# Patient Record
Sex: Female | Born: 1955
Health system: Southern US, Community
[De-identification: ages and names within clinical notes are randomized; demographics above are authoritative.]

## PROBLEM LIST (undated history)

## (undated) DIAGNOSIS — F329 Major depressive disorder, single episode, unspecified: Secondary | ICD-10-CM

## (undated) DIAGNOSIS — E669 Obesity, unspecified: Secondary | ICD-10-CM

## (undated) DIAGNOSIS — E739 Lactose intolerance, unspecified: Secondary | ICD-10-CM

## (undated) DIAGNOSIS — I10 Essential (primary) hypertension: Secondary | ICD-10-CM

## (undated) DIAGNOSIS — R931 Abnormal findings on diagnostic imaging of heart and coronary circulation: Secondary | ICD-10-CM

## (undated) DIAGNOSIS — C50919 Malignant neoplasm of unspecified site of unspecified female breast: Secondary | ICD-10-CM

## (undated) DIAGNOSIS — Z808 Family history of malignant neoplasm of other organs or systems: Secondary | ICD-10-CM

## (undated) DIAGNOSIS — Z8041 Family history of malignant neoplasm of ovary: Secondary | ICD-10-CM

## (undated) DIAGNOSIS — K760 Fatty (change of) liver, not elsewhere classified: Secondary | ICD-10-CM

## (undated) DIAGNOSIS — N979 Female infertility, unspecified: Secondary | ICD-10-CM

## (undated) DIAGNOSIS — F32A Depression, unspecified: Secondary | ICD-10-CM

## (undated) DIAGNOSIS — G4730 Sleep apnea, unspecified: Secondary | ICD-10-CM

## (undated) DIAGNOSIS — R0602 Shortness of breath: Secondary | ICD-10-CM

## (undated) DIAGNOSIS — Z8049 Family history of malignant neoplasm of other genital organs: Secondary | ICD-10-CM

## (undated) DIAGNOSIS — E785 Hyperlipidemia, unspecified: Secondary | ICD-10-CM

## (undated) DIAGNOSIS — R7303 Prediabetes: Secondary | ICD-10-CM

## (undated) DIAGNOSIS — Z853 Personal history of malignant neoplasm of breast: Secondary | ICD-10-CM

## (undated) DIAGNOSIS — E079 Disorder of thyroid, unspecified: Secondary | ICD-10-CM

## (undated) DIAGNOSIS — Z800 Family history of malignant neoplasm of digestive organs: Secondary | ICD-10-CM

## (undated) HISTORY — DX: Abnormal findings on diagnostic imaging of heart and coronary circulation: R93.1

## (undated) HISTORY — PX: BREAST SURGERY: SHX581

## (undated) HISTORY — DX: Disorder of thyroid, unspecified: E07.9

## (undated) HISTORY — DX: Fatty (change of) liver, not elsewhere classified: K76.0

## (undated) HISTORY — DX: Shortness of breath: R06.02

## (undated) HISTORY — DX: Essential (primary) hypertension: I10

## (undated) HISTORY — DX: Family history of malignant neoplasm of ovary: Z80.41

## (undated) HISTORY — DX: Malignant neoplasm of unspecified site of unspecified female breast: C50.919

## (undated) HISTORY — DX: Family history of malignant neoplasm of other genital organs: Z80.49

## (undated) HISTORY — DX: Depression, unspecified: F32.A

## (undated) HISTORY — DX: Family history of malignant neoplasm of digestive organs: Z80.0

## (undated) HISTORY — DX: Sleep apnea, unspecified: G47.30

## (undated) HISTORY — PX: HERNIA REPAIR: SHX51

## (undated) HISTORY — PX: BUNIONECTOMY: SHX129

## (undated) HISTORY — DX: Obesity, unspecified: E66.9

## (undated) HISTORY — DX: Hyperlipidemia, unspecified: E78.5

## (undated) HISTORY — PX: REFRACTIVE SURGERY: SHX103

## (undated) HISTORY — DX: Prediabetes: R73.03

## (undated) HISTORY — DX: Female infertility, unspecified: N97.9

## (undated) HISTORY — DX: Major depressive disorder, single episode, unspecified: F32.9

## (undated) HISTORY — DX: Lactose intolerance, unspecified: E73.9

## (undated) HISTORY — DX: Family history of malignant neoplasm of other organs or systems: Z80.8

## (undated) HISTORY — DX: Personal history of malignant neoplasm of breast: Z85.3

---

## 2011-06-25 DIAGNOSIS — C50919 Malignant neoplasm of unspecified site of unspecified female breast: Secondary | ICD-10-CM

## 2011-06-25 DIAGNOSIS — Z170 Estrogen receptor positive status [ER+]: Secondary | ICD-10-CM | POA: Insufficient documentation

## 2011-06-25 HISTORY — DX: Malignant neoplasm of unspecified site of unspecified female breast: C50.919

## 2015-11-24 DIAGNOSIS — C50911 Malignant neoplasm of unspecified site of right female breast: Secondary | ICD-10-CM | POA: Diagnosis not present

## 2015-11-24 DIAGNOSIS — Z7689 Persons encountering health services in other specified circumstances: Secondary | ICD-10-CM | POA: Diagnosis not present

## 2015-12-13 MED FILL — ROSUVASTATIN CALCIUM 20 MG: 20 | 30 days supply | Qty: 30 | Fill #0

## 2015-12-13 MED FILL — ESCITALOPRAM 20 MG TABLET: 20 | 30 days supply | Qty: 30 | Fill #0

## 2015-12-13 MED FILL — LETROZOLE 2.5 MG TABLET: 2.5 | 90 days supply | Qty: 90 | Fill #0

## 2015-12-13 MED FILL — LEVOTHYROXINE 125 MCG TAB: 125 | 30 days supply | Qty: 30 | Fill #0

## 2016-02-14 MED FILL — ESCITALOPRAM 20 MG TABLET: 20 | 30 days supply | Qty: 30 | Fill #1

## 2016-02-14 MED FILL — LEVOTHYROXINE 125 MCG TAB: 125 | 30 days supply | Qty: 30 | Fill #1

## 2016-02-27 MED FILL — ROSUVASTATIN CALCIUM 20 MG: 20 | 30 days supply | Qty: 30 | Fill #1

## 2016-03-11 MED FILL — LETROZOLE 2.5 MG TABLET: 2.5 | 90 days supply | Qty: 90 | Fill #1

## 2016-03-11 MED FILL — ESCITALOPRAM 20 MG TABLET: 20 | 30 days supply | Qty: 30 | Fill #2

## 2016-03-11 MED FILL — LEVOTHYROXINE 125 MCG TAB: 125 | 90 days supply | Qty: 90 | Fill #2

## 2016-03-28 ENCOUNTER — Ambulatory Visit: Payer: Self-pay

## 2016-04-01 MED FILL — ROSUVASTATIN CALCIUM 20 MG: 20 | 30 days supply | Qty: 30 | Fill #2

## 2016-04-19 DIAGNOSIS — E785 Hyperlipidemia, unspecified: Secondary | ICD-10-CM | POA: Diagnosis not present

## 2016-04-19 DIAGNOSIS — E039 Hypothyroidism, unspecified: Secondary | ICD-10-CM | POA: Diagnosis not present

## 2016-04-19 DIAGNOSIS — D7589 Other specified diseases of blood and blood-forming organs: Secondary | ICD-10-CM | POA: Diagnosis not present

## 2016-04-19 DIAGNOSIS — Z8659 Personal history of other mental and behavioral disorders: Secondary | ICD-10-CM | POA: Diagnosis not present

## 2016-04-19 DIAGNOSIS — E559 Vitamin D deficiency, unspecified: Secondary | ICD-10-CM | POA: Diagnosis not present

## 2016-04-19 DIAGNOSIS — R7301 Impaired fasting glucose: Secondary | ICD-10-CM | POA: Diagnosis not present

## 2016-04-19 DIAGNOSIS — Z1211 Encounter for screening for malignant neoplasm of colon: Secondary | ICD-10-CM | POA: Diagnosis not present

## 2016-04-19 MED FILL — ESCITALOPRAM 20 MG TABLET: 20 | 90 days supply | Qty: 90 | Fill #0

## 2016-04-29 ENCOUNTER — Ambulatory Visit (INDEPENDENT_AMBULATORY_CARE_PROVIDER_SITE_OTHER): Payer: 59 | Admitting: Physician Assistant

## 2016-04-29 DIAGNOSIS — E039 Hypothyroidism, unspecified: Secondary | ICD-10-CM

## 2016-04-29 DIAGNOSIS — Q845 Enlarged and hypertrophic nails: Secondary | ICD-10-CM

## 2016-04-29 DIAGNOSIS — E785 Hyperlipidemia, unspecified: Secondary | ICD-10-CM

## 2016-04-29 DIAGNOSIS — Z853 Personal history of malignant neoplasm of breast: Secondary | ICD-10-CM | POA: Diagnosis not present

## 2016-04-29 NOTE — Progress Notes (Signed)
Patient ID: Brittany Vincent, female     DOB: Jul 23, 1955, 60 y.o.    MRN: [id number]  PCP: No primary care provider on file.  Chief Complaint  Patient presents with  . Ingrown Toenail    rt big toe    Subjective:    HPI  Presents for evaluation of thickened toenails.  The toenails on both great toes are thickened and discolored. SOmetimes she has pain along the distal nail fold, and she's concerned that they may be ingrowing. The nails have been thickened since she underwent chemotherapy for breast cancer, diagnosed in the fall of 2013. She saw a podiatrist in [place], [place] (where she lived prior to moving to [place] to be near her son, daughter-in-law and granddaughter), who offered removal. At the time, there was no pain, and she elected against the procedure. Now that she is having pain, she's ready.  Prior to Admission medications   Medication Sig Start Date End Date Taking? Authorizing Provider  escitalopram (LEXAPRO) 20 MG tablet Take 20 mg by mouth daily.   Yes Historical Provider, MD  estrogens conjugated, synthetic A, (CENESTIN) 1.25 MG tablet Take 1.25 mg by mouth daily.   Yes Historical Provider, MD  levothyroxine (SYNTHROID, LEVOTHROID) 125 MCG tablet Take 125 mcg by mouth daily before breakfast.   Yes Historical Provider, MD  rosuvastatin (CRESTOR) 20 MG tablet Take 20 mg by mouth daily.   Yes Historical Provider, MD      No Known Allergies   Patient Active Problem List   Diagnosis Date Noted  . History of breast cancer 05/01/2016  . Hypothyroidism 05/01/2016  . Hyperlipidemia 05/01/2016     Family History  Problem Relation Age of Onset  . Heart disease Mother   . Cancer Father   . Stroke Maternal Grandmother   . Cancer Maternal Grandfather   . Heart disease Paternal Grandmother   . Stroke Paternal Grandfather      Social History   Social History  . Marital status: Married    Spouse name: N/A  . Number of children: 3  . Years of education: N/A     Occupational History  . RN    Social History Main Topics  . Smoking status: Never Smoker  . Smokeless tobacco: Never Used  . Alcohol use Not on file  . Drug use: Unknown  . Sexual activity: Not on file   Other Topics Concern  . Not on file   Social History Narrative   Lives with her husband.   Older son lives in [place], [place] with his wife and their 2 children.   Younger Nature conservation officer) son lives in [place], with his wife Lisbeth Renshaw) and their daughter, Ava.   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   Daughter recently moved to [place].   Moved to [place] from [place], [place] to be nearer to [place], [place] and Ava.        Review of Systems As above.      Objective:  Physical Exam  Constitutional: She is oriented to person, place, and time. She appears well-developed and well-nourished. She is active and cooperative. No distress.  There were no vitals taken for this visit.   Eyes: Conjunctivae are normal.  Pulmonary/Chest: Effort normal.  Neurological: She is alert and oriented to person, place, and time.  Skin: Skin is warm, dry and intact.  Nails on both great toes are thickened and discolored. Mild tenderness along the lateral nail folds bilaterally. No erythema, edema. No  drainage.  Psychiatric: She has a normal mood and affect. Her speech is normal and behavior is normal.     PROCEDURE: Verbal consent obtained. Performed nail removal on the RIGHT, then assisted Golda Acre, PA-S perform the same on the LEFT. Digital block with 4 cc 2% lidocaine plain.  SP&D.  Entire nail lifted and removed.  Xeroform placed.  Cleansed and dressed.         Assessment & Plan:  1. Enlarged and hypertrophic nails Nails on both great toes removed en toto. Local wound care. Anticipatory guidance provided.    Fara Chute, PA-C Physician Assistant-Certified [facility]

## 2016-04-29 NOTE — Progress Notes (Signed)
Subjective:    Patient ID: Brittany Vincent, female    DOB: [birth year], 60 y.o.   MRN: [id number]  HPI: Presents for problem with both great toe nails. Patient states she first noticed a problem in her right great toe nail in February 2014 after she had been receiving chemotherapy. Notes she had also been getting regular pedicures at this time and unsure if it could be related to that. States she had redness, swelling, and brown drainage from the toe at that time and her oncologist gave her an antibiotic which provided some relief and made the discharge subside. Since then, she has noticed continued thickening of both of her great toe nails with occasional associated pain and redness in her toes. States she saw a podiatrist 1 and 2 years ago in [place], [place] (previously lived there), however, it was not painful at that time therefore they decided to leave it alone. Patient states she has tried to use OTC topical medications for her nails which have not been very helpful at all. Denies trying Lamisil or systemic therapy for the toenail fungus. States she had foot surgery on her right foot in 2006 for a Morton's neuroma and bunion during which her right great toe was broken in 2 places and a screw was placed. States the right great toe has been becoming more and more painful over the past 6 months, noting some increased pain at night when the blankets hit her toe, but denies swelling, erythema, or discharge.  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 States she is still followed by her oncologist in [place]. Also states she recently saw her PCP and denies any other current problems.  Review of Systems Pertinent ROS mentioned above in HPI  No Known Allergies  Prior to Admission medications   Medication Sig Start Date End Date Taking? Authorizing Provider  escitalopram (LEXAPRO) 20 MG tablet Take 20 mg by mouth daily.   Yes Historical Provider,  MD  estrogens conjugated, synthetic A, (CENESTIN) 1.25 MG tablet Take 1.25 mg by mouth daily.   Yes Historical Provider, MD  rosuvastatin (CRESTOR) 20 MG tablet Take 20 mg by mouth daily.   Yes Historical Provider, MD   There are no active problems to display for this patient.      Objective:   Physical Exam  Musculoskeletal:       Right foot: There is normal range of motion, no tenderness, no bony tenderness, no swelling, normal capillary refill, no crepitus, no deformity and no laceration.       Left foot: There is normal range of motion, no tenderness, no bony tenderness, no swelling, normal capillary refill, no crepitus, no deformity and no laceration.       Feet:      Bilateral great toenail removal Patient consent obtained. Digital block performed in bilateral great toes with 5 cc in each toe of 2% plain Lidocaine. Betadine prep used to clean both great toes. Nail removal using probe and curved hemostats. Surrounding nail tissue removed using iris scissors. Nail growth gauze place on nail bed. Both great toes covered with non-adherent Tefla, gauze, and Coban.     Assessment & Plan:  1. Enlarged and hypertrophic nails Bilateral great toenail removal. Advised patient to keep nails clean and bandaged for 24 hours and then can change the outer bandage. Soak feet in warm and soapy water each day for 5-10 minutes for the next 5  days and re-dress the toe afterwards, making sure to dry completely. Continue to soak until yellow gauze comes off. Advised patient to RTC if symptoms worsening, increased swelling, redness, pus, drainage,streaking, or fever. Consider Lamisil in future if nails grow back with fungus.

## 2016-04-29 NOTE — Patient Instructions (Addendum)
     IF you received an x-ray today, you will receive an invoice from [facility]. Please contact [facility] at [phone] with questions or concerns regarding your invoice.   IF you received labwork today, you will receive an invoice from [facility] [employer]. Please contact [facility] at [phone] with questions or concerns regarding your invoice.   Our billing staff will not be able to assist you with questions regarding bills from these companies.  You will be contacted with the lab results as soon as they are available. The fastest way to get your results is to activate your My Chart account. Instructions are located on the last page of this paperwork. If you have not heard from [place] regarding the results in 2 weeks, please contact this office.    INGROWN TOENAIL . Keep area clean, dry and bandaged for 24 hours. . After 24 hours, remove outer bandage and leave yellow gauze in place. Domingo Madeira toe/foot in warm soapy water for 5-10 minutes, once daily for 5 days. Rebandage toe after each cleaning. . Continue soaks until yellow gauze falls off. . Notify the office if you experience any of the following signs of infection: Swelling, redness, pus drainage, streaking, fever > 101.0 F

## 2016-05-01 ENCOUNTER — Encounter: Payer: Self-pay | Admitting: Physician Assistant

## 2016-05-01 DIAGNOSIS — E785 Hyperlipidemia, unspecified: Secondary | ICD-10-CM | POA: Insufficient documentation

## 2016-05-01 DIAGNOSIS — E039 Hypothyroidism, unspecified: Secondary | ICD-10-CM

## 2016-05-01 DIAGNOSIS — Z853 Personal history of malignant neoplasm of breast: Secondary | ICD-10-CM | POA: Insufficient documentation

## 2016-05-01 HISTORY — DX: Hypothyroidism, unspecified: E03.9

## 2016-05-14 ENCOUNTER — Telehealth: Payer: Self-pay | Admitting: Nurse Practitioner

## 2016-05-14 ENCOUNTER — Other Ambulatory Visit: Payer: Self-pay | Admitting: Nurse Practitioner

## 2016-05-14 DIAGNOSIS — R059 Cough, unspecified: Secondary | ICD-10-CM

## 2016-05-14 DIAGNOSIS — R05 Cough: Secondary | ICD-10-CM

## 2016-05-14 MED ORDER — PREDNISONE 10 MG (21) PO TBPK
ORAL_TABLET | ORAL | 0 refills | Status: DC
Start: 2016-05-14 — End: 2017-07-22

## 2016-05-14 NOTE — Progress Notes (Signed)
We are sorry that you are not feeling well.  Here is how we plan to help!  Based on what you have shared with me it looks like you have upper respiratory tract inflammation that has resulted in a significant cough.  Inflammation and infection in the upper respiratory tract is commonly called bronchitis and has four common causes:  Allergies, Viral Infections, Acid Reflux and Bacterial Infections.  Allergies, viruses and acid reflux are treated by controlling symptoms or eliminating the cause. An example might be a cough caused by taking certain blood pressure medications. You stop the cough by changing the medication. Another example might be a cough caused by acid reflux. Controlling the reflux helps control the cough.  Based on your presentation I believe you most likely have A cough due to a virus.  This is called viral bronchitis and is best treated by rest, plenty of fluids and control of the cough.  You may use Ibuprofen or Tylenol as directed to help your symptoms.     In addition you may use A non-prescription cough medication called Mucinex DM: take 2 tablets every 12 hours.  Sterapred 10 mg dosepak  USE OF BRONCHODILATOR ("RESCUE") INHALERS: There is a risk from using your bronchodilator too frequently.  The risk is that over-reliance on a medication which only relaxes the muscles surrounding the breathing tubes can reduce the effectiveness of medications prescribed to reduce swelling and congestion of the tubes themselves.  Although you feel brief relief from the bronchodilator inhaler, your asthma may actually be worsening with the tubes becoming more swollen and filled with mucus.  This can delay other crucial treatments, such as oral steroid medications. If you need to use a bronchodilator inhaler daily, several times per day, you should discuss this with your provider.  There are probably better treatments that could be used to keep your asthma under control.     HOME CARE . Only take  medications as instructed by your medical team. . Complete the entire course of an antibiotic. . Drink plenty of fluids and get plenty of rest. . Avoid close contacts especially the very young and the elderly . Cover your mouth if you cough or cough into your sleeve. . Always remember to wash your hands . A steam or ultrasonic humidifier can help congestion.   GET HELP RIGHT AWAY IF: . You develop worsening fever. . You become short of breath . You cough up blood. . Your symptoms persist after you have completed your treatment plan MAKE SURE YOU   Understand these instructions.  Will watch your condition.  Will get help right away if you are not doing well or get worse.  Your e-visit answers were reviewed by a board certified advanced clinical practitioner to complete your personal care plan.  Depending on the condition, your plan could have included both over the counter or prescription medications. If there is a problem please reply  once you have received a response from your provider. Your safety is important to [place].  If you have drug allergies check your prescription carefully.    You can use MyChart to ask questions about today's visit, request a non-urgent call back, or ask for a work or school excuse for 24 hours related to this e-Visit. If it has been greater than 24 hours you will need to follow up with your provider, or enter a new e-Visit to address those concerns. You will get an e-mail in the next two days asking about your  experience.  I hope that your e-visit has been valuable and will speed your recovery. Thank you for using e-visits.

## 2016-05-15 ENCOUNTER — Other Ambulatory Visit: Payer: Self-pay | Admitting: Family

## 2016-05-15 MED FILL — ROSUVASTATIN CALCIUM 20 MG: 20 | 30 days supply | Qty: 30 | Fill #3

## 2016-05-15 MED FILL — predniSONE 10 MG TABS: 10 | 6 days supply | Qty: 21 | Fill #0

## 2016-06-11 MED FILL — LETROZOLE 2.5 MG TABLET: 2.5 | 90 days supply | Qty: 90 | Fill #2

## 2016-06-12 MED FILL — ROSUVASTATIN CALCIUM 20 MG: 20 | 90 days supply | Qty: 90 | Fill #0

## 2016-06-25 DIAGNOSIS — E559 Vitamin D deficiency, unspecified: Secondary | ICD-10-CM | POA: Diagnosis not present

## 2016-06-25 DIAGNOSIS — C50911 Malignant neoplasm of unspecified site of right female breast: Secondary | ICD-10-CM | POA: Diagnosis not present

## 2016-07-02 MED FILL — LEVOTHYROXINE 125 MCG TABLE: 125 | 90 days supply | Qty: 90 | Fill #3

## 2016-07-26 DIAGNOSIS — E669 Obesity, unspecified: Secondary | ICD-10-CM | POA: Diagnosis not present

## 2016-07-26 DIAGNOSIS — Z8659 Personal history of other mental and behavioral disorders: Secondary | ICD-10-CM | POA: Diagnosis not present

## 2016-07-26 DIAGNOSIS — Z0000 Encounter for general adult medical examination without abnormal findings: Secondary | ICD-10-CM | POA: Diagnosis not present

## 2016-07-26 DIAGNOSIS — E559 Vitamin D deficiency, unspecified: Secondary | ICD-10-CM | POA: Diagnosis not present

## 2016-07-26 DIAGNOSIS — Z1389 Encounter for screening for other disorder: Secondary | ICD-10-CM | POA: Diagnosis not present

## 2016-07-26 DIAGNOSIS — E039 Hypothyroidism, unspecified: Secondary | ICD-10-CM | POA: Diagnosis not present

## 2016-07-26 DIAGNOSIS — Z124 Encounter for screening for malignant neoplasm of cervix: Secondary | ICD-10-CM | POA: Diagnosis not present

## 2016-07-26 DIAGNOSIS — Z6832 Body mass index (BMI) 32.0-32.9, adult: Secondary | ICD-10-CM | POA: Diagnosis not present

## 2016-07-26 DIAGNOSIS — E785 Hyperlipidemia, unspecified: Secondary | ICD-10-CM | POA: Diagnosis not present

## 2016-07-26 DIAGNOSIS — R7301 Impaired fasting glucose: Secondary | ICD-10-CM | POA: Diagnosis not present

## 2016-08-08 DIAGNOSIS — Z9013 Acquired absence of bilateral breasts and nipples: Secondary | ICD-10-CM | POA: Diagnosis not present

## 2016-08-08 DIAGNOSIS — Z853 Personal history of malignant neoplasm of breast: Secondary | ICD-10-CM | POA: Diagnosis not present

## 2016-08-15 MED FILL — ESCITALOPRAM 20 MG TABLET: 20 | 90 days supply | Qty: 90 | Fill #1

## 2016-08-20 DIAGNOSIS — Z9889 Other specified postprocedural states: Secondary | ICD-10-CM | POA: Diagnosis not present

## 2016-08-20 DIAGNOSIS — Z719 Counseling, unspecified: Secondary | ICD-10-CM | POA: Diagnosis not present

## 2016-08-20 DIAGNOSIS — Z9013 Acquired absence of bilateral breasts and nipples: Secondary | ICD-10-CM | POA: Diagnosis not present

## 2016-08-20 DIAGNOSIS — C50919 Malignant neoplasm of unspecified site of unspecified female breast: Secondary | ICD-10-CM | POA: Diagnosis not present

## 2016-08-20 DIAGNOSIS — Z170 Estrogen receptor positive status [ER+]: Secondary | ICD-10-CM | POA: Diagnosis not present

## 2016-09-04 MED FILL — LETROZOLE 2.5 MG TABLET: 2.5 | 90 days supply | Qty: 90 | Fill #0

## 2016-09-04 MED FILL — ROSUVASTATIN CALCIUM 20 MG: 20 | 90 days supply | Qty: 90 | Fill #1

## 2016-10-14 DIAGNOSIS — K921 Melena: Secondary | ICD-10-CM | POA: Diagnosis not present

## 2016-10-14 DIAGNOSIS — Z719 Counseling, unspecified: Secondary | ICD-10-CM | POA: Diagnosis not present

## 2016-10-14 MED FILL — SUPREP BOWEL PREP KIT: 17.5-3.13-1 | 1 days supply | Qty: 354 | Fill #0

## 2016-10-17 MED FILL — LEVOTHYROXINE 125 MCG TABLE: 125 | 90 days supply | Qty: 90 | Fill #0

## 2016-10-29 MED FILL — ROSUVASTATIN CALCIUM 40 MG: 40 | 90 days supply | Qty: 90 | Fill #0

## 2016-11-11 DIAGNOSIS — K5730 Diverticulosis of large intestine without perforation or abscess without bleeding: Secondary | ICD-10-CM | POA: Diagnosis not present

## 2016-11-11 DIAGNOSIS — K648 Other hemorrhoids: Secondary | ICD-10-CM | POA: Diagnosis not present

## 2016-11-11 DIAGNOSIS — K921 Melena: Secondary | ICD-10-CM | POA: Diagnosis not present

## 2016-11-21 DIAGNOSIS — H524 Presbyopia: Secondary | ICD-10-CM | POA: Diagnosis not present

## 2016-11-28 DIAGNOSIS — J209 Acute bronchitis, unspecified: Secondary | ICD-10-CM | POA: Diagnosis not present

## 2016-11-28 DIAGNOSIS — J0100 Acute maxillary sinusitis, unspecified: Secondary | ICD-10-CM | POA: Diagnosis not present

## 2016-11-28 MED FILL — BENZONATATE 200 MG CAPSULE: 200 | 10 days supply | Qty: 30 | Fill #0

## 2016-11-28 MED FILL — VENTOLIN HFA 90 MCG INHALER: 108 (90 BAS | 25 days supply | Qty: 18 | Fill #0

## 2016-11-28 MED FILL — SULFAMETHOXAZOLE/TMP DS TAB: 800-160 | 10 days supply | Qty: 20 | Fill #0

## 2016-12-06 MED FILL — LETROZOLE 2.5 MG TABLET: 2.5 | 90 days supply | Qty: 90 | Fill #1

## 2016-12-06 MED FILL — ESCITALOPRAM 20 MG TABLET: 20 | 90 days supply | Qty: 90 | Fill #2

## 2017-01-14 DIAGNOSIS — C50911 Malignant neoplasm of unspecified site of right female breast: Secondary | ICD-10-CM | POA: Diagnosis not present

## 2017-01-14 DIAGNOSIS — E559 Vitamin D deficiency, unspecified: Secondary | ICD-10-CM | POA: Diagnosis not present

## 2017-01-29 MED FILL — LEVOTHYROXINE 125 MCG TABLE: 125 | 90 days supply | Qty: 90 | Fill #1

## 2017-02-03 MED FILL — ROSUVASTATIN CALCIUM 40 MG: 40 | 90 days supply | Qty: 90 | Fill #1

## 2017-03-04 MED FILL — LETROZOLE 2.5 MG TABLET: 2.5 | 90 days supply | Qty: 90 | Fill #2

## 2017-03-17 DIAGNOSIS — J209 Acute bronchitis, unspecified: Secondary | ICD-10-CM | POA: Diagnosis not present

## 2017-03-17 MED FILL — ESCITALOPRAM 20 MG TABLET: 20 | 90 days supply | Qty: 90 | Fill #3

## 2017-03-17 MED FILL — predniSONE 10 MG TABS: 10 | 5 days supply | Qty: 15 | Fill #0

## 2017-03-17 MED FILL — DOXYCYCLINE HYCLATE 100 MG: 100 | 7 days supply | Qty: 14 | Fill #0

## 2017-03-17 MED FILL — VENTOLIN HFA 90 MCG INHALER: 108 (90 BAS | 30 days supply | Qty: 18 | Fill #0

## 2017-05-14 MED FILL — LEVOTHYROXINE 125 MCG TAB: 125 | 90 days supply | Qty: 90 | Fill #2

## 2017-05-14 MED FILL — ROSUVASTATIN CALCIUM 40 MG: 40 | 90 days supply | Qty: 90 | Fill #2

## 2017-06-03 MED FILL — LETROZOLE 2.5 MG TABLET: 2.5 | 90 days supply | Qty: 90 | Fill #0

## 2017-06-27 DIAGNOSIS — Z6832 Body mass index (BMI) 32.0-32.9, adult: Secondary | ICD-10-CM | POA: Diagnosis not present

## 2017-06-27 DIAGNOSIS — R7301 Impaired fasting glucose: Secondary | ICD-10-CM | POA: Diagnosis not present

## 2017-06-27 DIAGNOSIS — E785 Hyperlipidemia, unspecified: Secondary | ICD-10-CM | POA: Diagnosis not present

## 2017-06-27 DIAGNOSIS — E039 Hypothyroidism, unspecified: Secondary | ICD-10-CM | POA: Diagnosis not present

## 2017-06-27 DIAGNOSIS — E669 Obesity, unspecified: Secondary | ICD-10-CM | POA: Diagnosis not present

## 2017-06-27 DIAGNOSIS — Z8659 Personal history of other mental and behavioral disorders: Secondary | ICD-10-CM | POA: Diagnosis not present

## 2017-06-27 DIAGNOSIS — E559 Vitamin D deficiency, unspecified: Secondary | ICD-10-CM | POA: Diagnosis not present

## 2017-07-08 MED FILL — ESCITALOPRAM 20 MG TABLET: 20 | 90 days supply | Qty: 90 | Fill #0

## 2017-07-22 ENCOUNTER — Telehealth: Payer: 59 | Admitting: Family

## 2017-07-22 DIAGNOSIS — J208 Acute bronchitis due to other specified organisms: Secondary | ICD-10-CM

## 2017-07-22 MED ORDER — PREDNISONE 10 MG (21) PO TBPK: ORAL_TABLET | ORAL | 0 refills | Status: DC

## 2017-07-22 MED FILL — predniSONE 10 MG TABS: 10 | 6 days supply | Qty: 21 | Fill #0

## 2017-07-22 NOTE — Progress Notes (Signed)
We are sorry that you are not feeling well.  Here is how we plan to help!  Based on your presentation I believe you most likely have A cough due to a virus.  This is called viral bronchitis and is best treated by rest, plenty of fluids and control of the cough.  You may use Ibuprofen or Tylenol as directed to help your symptoms.     In addition you may use A non-prescription cough medication called Robitussin DAC. Take 2 teaspoons every 8 hours or Delsym: take 2 teaspoons every 12 hours.  Sterapred 10 mg dosepak is a steroid. Your symptoms appear to be viral in nature. Therefore, an antibiotic is not indicated.   From your responses in the eVisit questionnaire you describe inflammation in the upper respiratory tract which is causing a significant cough.  This is commonly called Bronchitis and has four common causes:    Allergies  Viral Infections  Acid Reflux  Bacterial Infection Allergies, viruses and acid reflux are treated by controlling symptoms or eliminating the cause. An example might be a cough caused by taking certain blood pressure medications. You stop the cough by changing the medication. Another example might be a cough caused by acid reflux. Controlling the reflux helps control the cough.  USE OF BRONCHODILATOR ("RESCUE") INHALERS: There is a risk from using your bronchodilator too frequently.  The risk is that over-reliance on a medication which only relaxes the muscles surrounding the breathing tubes can reduce the effectiveness of medications prescribed to reduce swelling and congestion of the tubes themselves.  Although you feel brief relief from the bronchodilator inhaler, your asthma may actually be worsening with the tubes becoming more swollen and filled with mucus.  This can delay other crucial treatments, such as oral steroid medications. If you need to use a bronchodilator inhaler daily, several times per day, you should discuss this with your provider.  There are  probably better treatments that could be used to keep your asthma under control.     HOME CARE . Only take medications as instructed by your medical team. . Complete the entire course of an antibiotic. . Drink plenty of fluids and get plenty of rest. . Avoid close contacts especially the very young and the elderly . Cover your mouth if you cough or cough into your sleeve. . Always remember to wash your hands . A steam or ultrasonic humidifier can help congestion.   GET HELP RIGHT AWAY IF: . You develop worsening fever. . You become short of breath . You cough up blood. . Your symptoms persist after you have completed your treatment plan MAKE SURE YOU   Understand these instructions.  Will watch your condition.  Will get help right away if you are not doing well or get worse.  Your e-visit answers were reviewed by a board certified advanced clinical practitioner to complete your personal care plan.  Depending on the condition, your plan could have included both over the counter or prescription medications. If there is a problem please reply  once you have received a response from your provider. Your safety is important to [place].  If you have drug allergies check your prescription carefully.    You can use MyChart to ask questions about today's visit, request a non-urgent call back, or ask for a work or school excuse for 24 hours related to this e-Visit. If it has been greater than 24 hours you will need to follow up with your provider, or enter a  new e-Visit to address those concerns. You will get an e-mail in the next two days asking about your experience.  I hope that your e-visit has been valuable and will speed your recovery. Thank you for using e-visits.

## 2017-08-01 DIAGNOSIS — C50911 Malignant neoplasm of unspecified site of right female breast: Secondary | ICD-10-CM | POA: Diagnosis not present

## 2017-08-01 DIAGNOSIS — E559 Vitamin D deficiency, unspecified: Secondary | ICD-10-CM | POA: Diagnosis not present

## 2017-08-08 MED FILL — ROSUVASTATIN CALCIUM 40 MG: 40 | 90 days supply | Qty: 90 | Fill #0

## 2017-09-01 MED FILL — LETROZOLE 2.5 MG TABLET: 2.5 | 90 days supply | Qty: 90 | Fill #1

## 2017-09-01 MED FILL — LEVOTHYROXINE 125 MCG TAB: 125 | 90 days supply | Qty: 90 | Fill #0

## 2017-09-08 DIAGNOSIS — Z9013 Acquired absence of bilateral breasts and nipples: Secondary | ICD-10-CM | POA: Diagnosis not present

## 2017-09-08 DIAGNOSIS — Z853 Personal history of malignant neoplasm of breast: Secondary | ICD-10-CM | POA: Diagnosis not present

## 2017-09-08 DIAGNOSIS — L7634 Postprocedural seroma of skin and subcutaneous tissue following other procedure: Secondary | ICD-10-CM | POA: Diagnosis not present

## 2017-09-29 ENCOUNTER — Telehealth: Payer: 59 | Admitting: Family

## 2017-09-29 DIAGNOSIS — R6889 Other general symptoms and signs: Secondary | ICD-10-CM

## 2017-09-29 MED ORDER — OSELTAMIVIR PHOSPHATE 75 MG PO CAPS: 75.0000 mg | ORAL_CAPSULE | Freq: Two times a day (BID) | ORAL | 0 refills | Status: DC

## 2017-09-29 MED FILL — OSELTAMIVIR PHOSPHATE 75 MG: 75 | 5 days supply | Qty: 10 | Fill #0

## 2017-09-29 NOTE — Progress Notes (Signed)
E visit for Flu like symptoms   We are sorry that you are not feeling well.  Here is how we plan to help! Based on what you have shared with me it looks like you may have a respiratory virus that may be influenza.  Influenza or "the flu" is   an infection caused by a respiratory virus. The flu virus is highly contagious and persons who did not receive their yearly flu vaccination may "catch" the flu from close contact.  We have anti-viral medications to treat the viruses that cause this infection. They are not a "cure" and only shorten the course of the infection. These prescriptions are most effective when they are given within the first 2 days of "flu" symptoms. Antiviral medication are indicated if you have a high risk of complications from the flu. You should  also consider an antiviral medication if you are in close contact with someone who is at risk. These medications can help patients avoid complications from the flu  but have side effects that you should know. Possible side effects from Tamiflu or oseltamivir include nausea, vomiting, diarrhea, dizziness, headaches, eye redness, sleep problems or other respiratory symptoms. You should not take Tamiflu if you have an allergy to oseltamivir or any to the ingredients in Tamiflu.  Based upon your symptoms and potential risk factors I have prescribed Oseltamivir (Tamiflu).  It has been sent to your designated pharmacy.  You will take one 75 mg capsule orally twice a day for the next 5 days.   It depends on your husbands chronic illness if he needs the apophylactically treated.  If you feel like you would want him to be treated, he can complete an Evisit.   ANYONE WHO HAS FLU SYMPTOMS SHOULD: . Stay home. The flu is highly contagious and going out or to work exposes others! . Be sure to drink plenty of fluids. Water is fine as well as fruit juices, sodas and electrolyte beverages. You may want to stay away from caffeine or alcohol. If you are  nauseated, try taking small sips of liquids. How do you know if you are getting enough fluid? Your urine should be a pale yellow or almost colorless. . Get rest. . Taking a steamy shower or using a humidifier may help nasal congestion and ease sore throat pain. Using a saline nasal spray works much the same way. . Cough drops, hard candies and sore throat lozenges may ease your cough. . Line up a caregiver. Have someone check on you regularly.   GET HELP RIGHT AWAY IF: . You cannot keep down liquids or your medications. . You become short of breath . Your fell like you are going to pass out or loose consciousness. . Your symptoms persist after you have completed your treatment plan MAKE SURE YOU   Understand these instructions.  Will watch your condition.  Will get help right away if you are not doing well or get worse.  Your e-visit answers were reviewed by a board certified advanced clinical practitioner to complete your personal care plan.  Depending on the condition, your plan could have included both over the counter or prescription medications.  If there is a problem please reply  once you have received a response from your provider.  Your safety is important to [place].  If you have drug allergies check your prescription carefully.    You can use MyChart to ask questions about today's visit, request a non-urgent call back, or ask for a  work or school excuse for 24 hours related to this e-Visit. If it has been greater than 24 hours you will need to follow up with your provider, or enter a new e-Visit to address those concerns.  You will get an e-mail in the next two days asking about your experience.  I hope that your e-visit has been valuable and will speed your recovery. Thank you for using e-visits.

## 2017-10-21 MED FILL — ESCITALOPRAM 20 MG TABLET: 20 | 90 days supply | Qty: 90 | Fill #1

## 2017-11-12 MED FILL — ROSUVASTATIN CALCIUM 40 MG: 40 | 90 days supply | Qty: 90 | Fill #1

## 2017-11-27 MED FILL — LETROZOLE 2.5 MG TABLET: 2.5 | 90 days supply | Qty: 90 | Fill #2

## 2017-12-16 MED FILL — LEVOTHYROXINE 125 MCG TAB: 125 | 90 days supply | Qty: 90 | Fill #1

## 2018-01-16 DIAGNOSIS — C50911 Malignant neoplasm of unspecified site of right female breast: Secondary | ICD-10-CM | POA: Diagnosis not present

## 2018-02-02 MED FILL — ESCITALOPRAM 20 MG TABLET: 20 | 90 days supply | Qty: 90 | Fill #2

## 2018-02-12 MED FILL — ROSUVASTATIN CALCIUM 40 MG: 40 | 90 days supply | Qty: 90 | Fill #2

## 2018-02-25 MED FILL — LETROZOLE 2.5 MG TABLET: 2.5 | 90 days supply | Qty: 90 | Fill #0

## 2018-03-26 MED FILL — LEVOTHYROXINE 125 MCG TAB: 125 | 90 days supply | Qty: 90 | Fill #2

## 2018-05-04 MED FILL — ESCITALOPRAM 20 MG TABLET: 20 | 90 days supply | Qty: 90 | Fill #3

## 2018-05-20 MED FILL — ROSUVASTATIN CALCIUM 40 MG: 40 | 90 days supply | Qty: 90 | Fill #3

## 2018-05-25 MED FILL — LETROZOLE 2.5 MG TAB: 2.5 | 90 days supply | Qty: 90 | Fill #1

## 2018-05-27 DIAGNOSIS — J019 Acute sinusitis, unspecified: Secondary | ICD-10-CM | POA: Diagnosis not present

## 2018-05-27 MED FILL — AMOX-CLAV 875-125 MG TABLET: 875-125 | 7 days supply | Qty: 14 | Fill #0

## 2018-06-08 DIAGNOSIS — H52223 Regular astigmatism, bilateral: Secondary | ICD-10-CM | POA: Diagnosis not present

## 2018-06-08 DIAGNOSIS — H524 Presbyopia: Secondary | ICD-10-CM | POA: Diagnosis not present

## 2018-06-08 DIAGNOSIS — H5213 Myopia, bilateral: Secondary | ICD-10-CM | POA: Diagnosis not present

## 2018-06-26 ENCOUNTER — Telehealth: Payer: Self-pay | Admitting: Family Medicine

## 2018-06-26 NOTE — Telephone Encounter (Signed)
Yes. I will see her. Thanks. Can schedule appointment as needed.

## 2018-06-26 NOTE — Telephone Encounter (Signed)
Copied from [place] [phone]. Topic: Appointment Scheduling - Scheduling Inquiry for Clinic >> Jun 26, 2018  1:32 PM Conception Chancy, NT wrote: Reason for CRM: patient is calling and states that Corretta Munce is her son and was a PA at this office. She states that she was told that Dr. Carlota Raspberry will accept her as a new patient. Please advise and contact patient.

## 2018-06-26 NOTE — Telephone Encounter (Signed)
Dr. Carlota Raspberry pls advise. thanks

## 2018-06-27 NOTE — Telephone Encounter (Signed)
LVM for pt to call office and schedule an appt with Dr. Carlota Raspberry. He has accepted her as a new patient. Please schedule an OV for Establish Care with Carlota Raspberry. Thank you!

## 2018-06-27 NOTE — Telephone Encounter (Signed)
Pt called back. I was able to make her an appt with Dr. Carlota Raspberry on 07/03/18 at 3:40. I advised of time, building number and late policy. Pt acknowledged.

## 2018-07-02 MED FILL — LEVOTHYROXINE 125 MCG TAB: 125 | 90 days supply | Qty: 90 | Fill #0

## 2018-07-03 ENCOUNTER — Ambulatory Visit: Payer: 59 | Admitting: Family Medicine

## 2018-07-09 ENCOUNTER — Ambulatory Visit: Payer: 59

## 2018-07-09 ENCOUNTER — Other Ambulatory Visit: Payer: Self-pay | Admitting: Occupational Medicine

## 2018-07-09 DIAGNOSIS — M545 Low back pain, unspecified: Secondary | ICD-10-CM

## 2018-07-13 DIAGNOSIS — C50911 Malignant neoplasm of unspecified site of right female breast: Secondary | ICD-10-CM | POA: Diagnosis not present

## 2018-07-13 DIAGNOSIS — Z9012 Acquired absence of left breast and nipple: Secondary | ICD-10-CM | POA: Diagnosis not present

## 2018-07-27 ENCOUNTER — Ambulatory Visit: Payer: 59 | Admitting: Family Medicine

## 2018-07-27 ENCOUNTER — Other Ambulatory Visit: Payer: Self-pay

## 2018-07-27 ENCOUNTER — Encounter: Payer: Self-pay | Admitting: Family Medicine

## 2018-07-27 VITALS — BP 112/72 | HR 83 | Temp 98.1°F | Resp 14 | Ht 67.0 in | Wt 209.8 lb

## 2018-07-27 DIAGNOSIS — E782 Mixed hyperlipidemia: Secondary | ICD-10-CM | POA: Diagnosis not present

## 2018-07-27 DIAGNOSIS — Z20828 Contact with and (suspected) exposure to other viral communicable diseases: Secondary | ICD-10-CM | POA: Diagnosis not present

## 2018-07-27 DIAGNOSIS — F418 Other specified anxiety disorders: Secondary | ICD-10-CM

## 2018-07-27 DIAGNOSIS — E039 Hypothyroidism, unspecified: Secondary | ICD-10-CM | POA: Diagnosis not present

## 2018-07-27 DIAGNOSIS — E785 Hyperlipidemia, unspecified: Secondary | ICD-10-CM

## 2018-07-27 MED ORDER — ROSUVASTATIN CALCIUM 20 MG PO TABS: 20.0000 mg | ORAL_TABLET | Freq: Every day | ORAL | 1 refills | Status: DC

## 2018-07-27 MED ORDER — ESCITALOPRAM OXALATE 20 MG PO TABS: 20.0000 mg | ORAL_TABLET | Freq: Every day | ORAL | 1 refills | Status: DC

## 2018-07-27 MED ORDER — OSELTAMIVIR PHOSPHATE 75 MG PO CAPS: 75.0000 mg | ORAL_CAPSULE | Freq: Every day | ORAL | 0 refills | Status: DC

## 2018-07-27 MED ORDER — LEVOTHYROXINE SODIUM 125 MCG PO TABS: 125.0000 ug | ORAL_TABLET | Freq: Every day | ORAL | 1 refills | Status: DC

## 2018-07-27 NOTE — Progress Notes (Signed)
Subjective:    Patient ID: Brittany Vincent, female    DOB: [birth year], 63 y.o.   MRN: [id number]  HPI Brittany Vincent is a 63 y.o. female Presents today for: Chief Complaint  Patient presents with  . Establish Care    not having any issus at this time. will need refill on medication that I already pend   No specific questions.  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   Hyperlipidemia: On Crestor.. Lipids last August. Reportedly ok. No new side effects with Crestor.   Hypothyroidism: No results found for: TSH tsh reportedly ok in August.  On synthroid 154mcg qd. Weight up, but thought to be due to calorie intake and decreased exercise. No new hair/skin changes or heart palpitatons.   Exposure to flu -  DTR in law diagnosed recently. Took Tamiflu in past and tolerated well. No symptoms currently.   Hist of  Breast Cancer Oncologist in [place]. Diagnosed in 2013. bilat mastectomy, chemo for 6 months. On Femara,  appt tomorrow with oncology.   Anxiety, hx of PMDD: On lexapro 20mg  per day - doing well. Helps with anxiety/stress. No new side effects.  Depression screen PHQ 2/9 04/29/2016  Decreased Interest 0  Down, Depressed, Hopeless 0  PHQ - 2 Score 0     Patient Active Problem List   Diagnosis Date Noted  . History of breast cancer 05/01/2016  . Hypothyroidism 05/01/2016  . Hyperlipidemia 05/01/2016   Past Medical History:  Diagnosis Date  . Breast cancer ([place]) 2013  . Depression   . Hyperlipidemia   . Thyroid disease     No Known Allergies Prior to Admission medications   Medication Sig Start Date End Date Taking? Authorizing Provider  Calcium Carbonate-Vit D-Min (CALTRATE 600+D PLUS MINERALS) 600-800 MG-UNIT CHEW Chew by mouth.   Yes [provider]  Cholecalciferol (VITAMIN D) 50 MCG (2000 UT) CAPS Take by mouth.   Yes [provider]  escitalopram (LEXAPRO) 20 MG tablet Take 20 mg by mouth daily.   Yes [provider]  letrozole (FEMARA) 2.5 MG tablet Take  2.5 mg by mouth daily.   Yes [provider]  letrozole (FEMARA) 2.5 MG tablet Take 2.5 mg by mouth daily.   Yes [provider]  levothyroxine (SYNTHROID, LEVOTHROID) 125 MCG tablet Take 125 mcg by mouth daily before breakfast.   Yes [provider]  Multiple Vitamins-Minerals (MULTIVITAMIN WITH IRON-MINERALS) liquid Take by mouth daily.   Yes [provider]  rosuvastatin (CRESTOR) 20 MG tablet Take 20 mg by mouth daily.   Yes [provider]   Social History   Socioeconomic History  . Marital status: Married    Spouse name: Not on file  . Number of children: 3  . Years of education: Not on file  . Highest education level: Not on file  Occupational History  . Occupation: Therapist, sports  Social Needs  . Financial resource strain: Not on file  . Food insecurity:    Worry: Not on file    Inability: Not on file  . Transportation needs:    Medical: Not on file    Non-medical: Not on file  Tobacco Use  . Smoking status: Never Smoker  . Smokeless tobacco: Never Used  Substance and Sexual Activity  . Alcohol use: No  . Drug use: No  . Sexual activity: Not on file  Lifestyle  . Physical activity:    Days per week: Not on file    Minutes per session: Not on file  .  Stress: Not on file  Relationships  . Social connections:    Talks on phone: Not on file    Gets together: Not on file    Attends religious service: Not on file    Active member of club or organization: Not on file    Attends meetings of clubs or organizations: Not on file    Relationship status: Not on file  . Intimate partner violence:    Fear of current or ex partner: Not on file    Emotionally abused: Not on file    Physically abused: Not on file    Forced sexual activity: Not on file  Other Topics Concern  . Not on file  Social History Narrative   Lives with her husband.   Older son lives in [place], [place] with his wife and their 2 children.   Younger Nature conservation officer) son lives in  [place], with his wife Lisbeth Renshaw) and their daughter, Ava.   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   Daughter recently moved to [place].   Moved to [place] from [place], [place] to be nearer to [place], [place] and Ava.    Review of Systems Per HPI     Objective:   Physical Exam Vitals signs reviewed.  Constitutional:      Appearance: She is well-developed.  HENT:     Head: Normocephalic and atraumatic.  Eyes:     Conjunctiva/sclera: Conjunctivae normal.     Pupils: Pupils are equal, round, and reactive to light.  Neck:     Vascular: No carotid bruit.  Cardiovascular:     Rate and Rhythm: Normal rate and regular rhythm.     Heart sounds: Normal heart sounds.  Pulmonary:     Effort: Pulmonary effort is normal.     Breath sounds: Normal breath sounds.  Abdominal:     Palpations: Abdomen is soft. There is no pulsatile mass.     Tenderness: There is no abdominal tenderness.  Skin:    General: Skin is warm and dry.  Neurological:     Mental Status: She is alert and oriented to person, place, and time.  Psychiatric:        Behavior: Behavior normal.    Vitals:   07/27/18 1603 07/27/18 1721  BP: 137/77 112/72  Pulse: 83   Resp: 14   Temp: 98.1 F (36.7 C)   TempSrc: Oral   SpO2: 98%   Weight: 209 lb 12.8 oz (95.2 kg)   Height: 5\' 7"  (1.702 m)         Assessment & Plan:   Brittany Vincent is a 63 y.o. female Mixed hyperlipidemia - Plan: Lipid Panel, Comprehensive metabolic panel Hyperlipidemia, unspecified hyperlipidemia type - Plan: rosuvastatin (CRESTOR) 20 MG tablet  -  Stable, tolerating current regimen. Medications refilled. Labs pending as above. If elevated, may need to repeat after 8hr fast.   Hypothyroidism, unspecified type - Plan: levothyroxine (SYNTHROID, LEVOTHROID) 125 MCG tablet, TSH  -  Stable, tolerating current regimen. Medications refilled. Labs pending as above.   Situational anxiety - Plan: escitalopram (LEXAPRO) 20 MG  tablet  -  Stable, tolerating current regimen. Medications refilled.   Exposure to influenza - Plan: oseltamivir (TAMIFLU) 75 MG capsule  -tamiflu daily for prevention as option. Possible side effects discussed. As no longer in direct contact, may be able to defer use. BID dosing if sx's of flu discussed.   Meds ordered this encounter  Medications  . escitalopram (LEXAPRO) 20 MG tablet  Sig: Take 1 tablet (20 mg total) by mouth daily.    Dispense:  90 tablet    Refill:  1  . levothyroxine (SYNTHROID, LEVOTHROID) 125 MCG tablet    Sig: Take 1 tablet (125 mcg total) by mouth daily before breakfast.    Dispense:  90 tablet    Refill:  1  . rosuvastatin (CRESTOR) 20 MG tablet    Sig: Take 1 tablet (20 mg total) by mouth daily.    Dispense:  90 tablet    Refill:  1  . oseltamivir (TAMIFLU) 75 MG capsule    Sig: Take 1 capsule (75 mg total) by mouth daily.    Dispense:  10 capsule    Refill:  0   Patient Instructions    I will check the blood work from today, but if the lipids or blood sugar is elevated, we can repeat that with a lab only visit.  Tamiflu if needed for either prevention or if you experience symptoms of the flu, take that twice per day.  No med changes at this time, please follow-up in the next 3 months to review previous notes.    If you have lab work done today you will be contacted with your lab results within the next 2 weeks.  If you have not heard from [place] then please contact us. The fastest way to get your results is to register for My Chart.   IF you received an x-ray today, you will receive an invoice from [facility]. Please contact [facility] at [phone] with questions or concerns regarding your invoice.   IF you received labwork today, you will receive an invoice from [facility]. Please contact [facility] at [phone] with questions or concerns regarding your invoice.   Our billing staff will not be able to assist you with questions  regarding bills from these companies.  You will be contacted with the lab results as soon as they are available. The fastest way to get your results is to activate your My Chart account. Instructions are located on the last page of this paperwork. If you have not heard from [place] regarding the results in 2 weeks, please contact this office.       Signed,   Merri Ray, MD [facility] [place].  07/30/18 10:05 PM

## 2018-07-27 NOTE — Patient Instructions (Addendum)
  I will check the blood work from today, but if the lipids or blood sugar is elevated, we can repeat that with a lab only visit.  Tamiflu if needed for either prevention or if you experience symptoms of the flu, take that twice per day.  No med changes at this time, please follow-up in the next 3 months to review previous notes.    If you have lab work done today you will be contacted with your lab results within the next 2 weeks.  If you have not heard from [place] then please contact us. The fastest way to get your results is to register for My Chart.   IF you received an x-ray today, you will receive an invoice from [facility]. Please contact [facility] at [phone] with questions or concerns regarding your invoice.   IF you received labwork today, you will receive an invoice from [facility]. Please contact [facility] at [phone] with questions or concerns regarding your invoice.   Our billing staff will not be able to assist you with questions regarding bills from these companies.  You will be contacted with the lab results as soon as they are available. The fastest way to get your results is to activate your My Chart account. Instructions are located on the last page of this paperwork. If you have not heard from [place] regarding the results in 2 weeks, please contact this office.

## 2018-07-28 DIAGNOSIS — C50911 Malignant neoplasm of unspecified site of right female breast: Secondary | ICD-10-CM | POA: Diagnosis not present

## 2018-07-28 DIAGNOSIS — E559 Vitamin D deficiency, unspecified: Secondary | ICD-10-CM | POA: Diagnosis not present

## 2018-07-28 LAB — COMPREHENSIVE METABOLIC PANEL
A/G RATIO: 1.8 (ref 1.2–2.2)
ALT: 26 IU/L (ref 0–32)
AST: 32 IU/L (ref 0–40)
Albumin: 4.5 g/dL (ref 3.8–4.8)
Alkaline Phosphatase: 66 IU/L (ref 39–117)
BUN/Creatinine Ratio: 20 (ref 12–28)
BUN: 15 mg/dL (ref 8–27)
Bilirubin Total: 0.2 mg/dL (ref 0.0–1.2)
CALCIUM: 9.7 mg/dL (ref 8.7–10.3)
CO2: 22 mmol/L (ref 20–29)
Chloride: 102 mmol/L (ref 96–106)
Creatinine, Ser: 0.74 mg/dL (ref 0.57–1.00)
GFR calc Af Amer: 100 mL/min/{1.73_m2} (ref >59)
GFR, EST NON AFRICAN AMERICAN: 87 mL/min/{1.73_m2} (ref >59)
Globulin, Total: 2.5 g/dL (ref 1.5–4.5)
Glucose: 159 mg/dL — ABNORMAL HIGH (ref 65–99)
Potassium: 4.1 mmol/L (ref 3.5–5.2)
Sodium: 142 mmol/L (ref 134–144)
Total Protein: 7 g/dL (ref 6.0–8.5)

## 2018-07-28 LAB — LIPID PANEL
Chol/HDL Ratio: 3.7 ratio (ref 0.0–4.4)
Cholesterol, Total: 164 mg/dL (ref 100–199)
HDL: 44 mg/dL (ref >39)
LDL Calculated: 46 mg/dL (ref 0–99)
Triglycerides: 369 mg/dL — ABNORMAL HIGH (ref 0–149)
VLDL Cholesterol Cal: 74 mg/dL — ABNORMAL HIGH (ref 5–40)

## 2018-07-28 LAB — TSH: TSH: 3.29 u[IU]/mL (ref 0.450–4.500)

## 2018-07-28 MED FILL — ROSUVASTATIN CALCIUM 20 MG: 20 | 90 days supply | Qty: 90 | Fill #0

## 2018-07-28 MED FILL — ESCITALOPRAM 20 MG TABLET: 20 | 90 days supply | Qty: 90 | Fill #0

## 2018-07-30 ENCOUNTER — Encounter: Payer: Self-pay | Admitting: Family Medicine

## 2018-08-05 ENCOUNTER — Other Ambulatory Visit: Payer: Self-pay

## 2018-08-05 ENCOUNTER — Other Ambulatory Visit: Payer: Self-pay | Admitting: Family Medicine

## 2018-08-05 ENCOUNTER — Encounter: Payer: Self-pay | Admitting: Physical Therapy

## 2018-08-05 ENCOUNTER — Ambulatory Visit: Payer: PRIVATE HEALTH INSURANCE | Attending: Occupational Medicine | Admitting: Physical Therapy

## 2018-08-05 DIAGNOSIS — R252 Cramp and spasm: Secondary | ICD-10-CM | POA: Diagnosis not present

## 2018-08-05 DIAGNOSIS — M545 Low back pain, unspecified: Secondary | ICD-10-CM

## 2018-08-05 DIAGNOSIS — R739 Hyperglycemia, unspecified: Secondary | ICD-10-CM

## 2018-08-05 NOTE — Therapy (Signed)
[place], [place], [address] Phone: [phone]   Fax:  [phone]  Physical Therapy Evaluation  Patient Details  Name: Brittany Vincent MRN: [id number] Date of Birth: 28-Dec-1955 Referring Provider (PT): Dannial Monarch, MD   Encounter Date: 08/05/2018  PT End of Session - 08/05/18 1551    Visit Number  1    Number of Visits  7    Date for PT Re-Evaluation  08/28/18    Authorization Type  [facility] Eval+6 visits Claim #[id number] Vilma Meckel    PT Start Time  1500    PT Stop Time  1545    PT Time Calculation (min)  45 min    Activity Tolerance  Patient tolerated treatment well    Behavior During Therapy  [facility] for tasks assessed/performed       Past Medical History:  Diagnosis Date  . Breast cancer ([place]) 2013  . Depression   . Hyperlipidemia   . Thyroid disease     Past Surgical History:  Procedure Laterality Date  . BREAST SURGERY    . BUNIONECTOMY Right     There were no vitals filed for this visit.   Subjective Assessment - 08/05/18 1507    Subjective  I strained my back a while ago after lifting a heavy lady, I was told I had a fractured coccyx. Around 50-14 yo and slipped on kitchen floor falling on back. Did not know she had the fracture and did not have pain. In Dec 2019 sat on rolling chair and grabbed counter to pull chair fwd and chair slipped out from under her. fell on buttocks- more to Right. Was able to get up and go about day, could tell it was going to be sore after impact. My back pops now when I bend foward or turn body. Rubs along Rt QL and SIJ. Pain with rolling in bed from back.     Currently in Pain?  Yes    Pain Score  3     Pain Location  Back    Pain Orientation  Right;Lower    Pain Descriptors / Indicators  Aching   bruised   Aggravating Factors   bending, twisting         [facility] PT Assessment - 08/05/18 0001      Assessment   Medical Diagnosis  LBP    Referring Provider (PT)   Dannial Monarch, MD    Onset Date/Surgical Date  --   06/10/18   Hand Dominance  Right    Prior Therapy  no      Restrictions   Weight Bearing Restrictions  No      Balance Screen   Has the patient fallen in the past 6 months  Yes    How many times?  1    Has the patient had a decrease in activity level because of a fear of falling?   No    Is the patient reluctant to leave their home because of a fear of falling?   No      Home Film/video editor residence    Additional Comments  stairs at home      Prior Function   Level of Independence  Independent    Vocation  Full time employment    Occupational psychologist on [place]   Overall Cognitive Status  Within Functional Limits for tasks assessed  Sensation   Additional Comments  WFL      ROM / Strength   AROM / PROM / Strength  AROM      AROM   AROM Assessment Site  Lumbar    Lumbar Extension  --   pulling on Right   Lumbar - Right Side Bend  --   pulling on Rt               Objective measurements completed on examination: See above findings.      [facility] [place] Adult PT Treatment/Exercise - 08/05/18 0001      Exercises   Exercises  Lumbar      Lumbar Exercises: Stretches   Lower Trunk Rotation  5 reps      Lumbar Exercises: Supine   Pelvic Tilt Limitations  Post pelvic tilt with pillow squeeze      Manual Therapy   Manual Therapy  Muscle Energy Technique;Soft tissue mobilization    Soft tissue mobilization  Rt QL    Muscle Energy Technique  Lt hip flexors/Rt extensors, iso abd/add             PT Education - 08/05/18 1718    Education Details  anatomy of condition, POC, HEP, exercise form/rationale, resting posture    Person(s) Educated  Patient    Methods  Explanation;Demonstration;Tactile cues;Verbal cues;Handout    Comprehension  Verbalized understanding;Need further instruction;Returned demonstration;Verbal cues required;Tactile cues  required          PT Long Term Goals - 08/05/18 1712      PT LONG TERM GOAL #1   Title  pt will be able to complete full work shift and return home without feeling of excessive fatigue in Rt lower back    Baseline  feels that it is very tired and just has to rest as soon as she finishes work    Time  3    Period  Weeks    Status  New    Target Date  08/28/18      PT LONG TERM GOAL #2   Title  pt will be able to perform bed mobility without increased LBP    Baseline  significant pain when laying supine and pain with rolling over in bed    Time  6    Period  Weeks    Status  New    Target Date  08/28/18      PT LONG TERM GOAL #3   Title  will be able to perform trunk mobility for daily activities without popping     Baseline  frequent with flexion and right trunk rotation    Time  3    Period  Weeks    Status  New    Target Date  08/28/18      PT LONG TERM GOAL #4   Title  gross lumbopelvic strength to 5/5 for necessary stability    Baseline  not appropriate to test due to notable rotation at eval    Time  3    Period  Weeks    Status  New    Target Date  08/28/18             Plan - 08/05/18 1707    Clinical Impression Statement  Pt presents to PT with Rt-sided LBP that began in December when she fell from a chair onto her Rt buttocks. Notable pelvic rotation- Rt ant/Lt post- corrected with MET today and pt verbalized noted difference. Difficulty with abdominal engagement but  achieved after cuing. pt will benefit from skilled PT to stabilize lumbopelvic region to decrease pain and reduce pelvic rotation.     Clinical Presentation  Stable    Clinical Decision Making  Low    Rehab Potential  Good    PT Frequency  2x / week   6 visits per [facility]   PT Duration  3 weeks    PT Treatment/Interventions  ADLs/Self Care Home Management;Cryotherapy;Electrical Stimulation;Moist Heat;Iontophoresis 110m/ml Dexamethasone;Therapeutic activities;Therapeutic exercise;Patient/family  education;Manual techniques;Passive range of motion;Taping;Dry needling;Joint Manipulations    PT Next Visit Plan  check pelvic rotation, lumbopelvic stability, consider DN    PT Home Exercise Plan  LTR, PPT, supine iso add    Consulted and Agree with Plan of Care  Patient       Patient will benefit from skilled therapeutic intervention in order to improve the following deficits and impairments:  Pain, Postural dysfunction, Increased muscle spasms, Decreased activity tolerance, Decreased strength  Visit Diagnosis: Acute right-sided low back pain without sciatica - Plan: PT plan of care cert/re-cert  Cramp and spasm - Plan: PT plan of care cert/re-cert     Problem List Patient Active Problem List   Diagnosis Date Noted  . History of breast cancer 05/01/2016  . Hypothyroidism 05/01/2016  . Hyperlipidemia 05/01/2016    Denard Tuminello C. Washington Whedbee PT, DPT 08/05/18 5:22 PM   [place] [address]Phone: [phone]  Fax:  [phone] Name: Brittany BeyersdorfMRN: [id number] of Birth: 101/28/1957

## 2018-08-11 DIAGNOSIS — Z1151 Encounter for screening for human papillomavirus (HPV): Secondary | ICD-10-CM | POA: Diagnosis not present

## 2018-08-11 DIAGNOSIS — Z6832 Body mass index (BMI) 32.0-32.9, adult: Secondary | ICD-10-CM | POA: Diagnosis not present

## 2018-08-11 DIAGNOSIS — Z01419 Encounter for gynecological examination (general) (routine) without abnormal findings: Secondary | ICD-10-CM | POA: Diagnosis not present

## 2018-08-19 ENCOUNTER — Ambulatory Visit: Payer: PRIVATE HEALTH INSURANCE | Admitting: Physical Therapy

## 2018-08-21 ENCOUNTER — Telehealth: Payer: Self-pay | Admitting: Physical Therapy

## 2018-08-21 ENCOUNTER — Encounter: Payer: Self-pay | Admitting: Physical Therapy

## 2018-08-21 NOTE — Telephone Encounter (Signed)
Left message on mobile device regarding no show today. Advised of next appointment time and requested call back if needed to RS.  Jeramy Dimmick C. Lakiesha Ralphs PT, DPT 08/21/18 11:51 AM

## 2018-08-24 ENCOUNTER — Ambulatory Visit: Payer: No Typology Code available for payment source | Attending: Occupational Medicine | Admitting: Physical Therapy

## 2018-08-24 ENCOUNTER — Telehealth: Payer: Self-pay | Admitting: Physical Therapy

## 2018-08-24 MED FILL — LETROZOLE 2.5 MG TABS: 2.5 | 90 days supply | Qty: 90 | Fill #2

## 2018-08-24 NOTE — Telephone Encounter (Signed)
Spoke with patient regarding no show today. Forgot about the appointment. Advised of next appointment time and pt reports she will attend.  Brittany Vincent C. Brittany Vincent PT, DPT 08/24/18 5:32 PM

## 2018-08-26 ENCOUNTER — Ambulatory Visit: Payer: No Typology Code available for payment source | Admitting: Physical Therapy

## 2018-09-03 ENCOUNTER — Ambulatory Visit: Payer: No Typology Code available for payment source | Admitting: Physical Therapy

## 2018-09-14 MED FILL — LEVOTHYROXINE 125 MCG TABLE: 125 | 90 days supply | Qty: 90 | Fill #0

## 2018-11-10 MED FILL — LETROZOLE 2.5 MG TABLET: 2.5 | 90 days supply | Qty: 90 | Fill #0

## 2018-11-10 MED FILL — ROSUVASTATIN CALCIUM 20 MG: 20 | 90 days supply | Qty: 90 | Fill #0

## 2018-11-13 ENCOUNTER — Telehealth: Payer: Self-pay | Admitting: Family Medicine

## 2018-11-13 DIAGNOSIS — R739 Hyperglycemia, unspecified: Secondary | ICD-10-CM

## 2018-11-13 DIAGNOSIS — E781 Pure hyperglyceridemia: Secondary | ICD-10-CM

## 2018-11-13 NOTE — Telephone Encounter (Signed)
Copied from [place] [phone] [id number]. Topic: General - Inquiry >> Nov 13, 2018  2:44 PM Richardo Priest, NT wrote: Reason for CRM: Patient is calling in requesting fasting labs be ordered for herself and her husband. Call back is [phone].

## 2018-11-19 MED FILL — ESCITALOPRAM 20 MG TABLET: 20 | 90 days supply | Qty: 90 | Fill #1

## 2018-11-19 NOTE — Telephone Encounter (Signed)
Order already in for A1c, CMP and lipid panel ordered. Please let her know. I will order labs for her husband as well. Advised by [employer].

## 2018-11-19 NOTE — Telephone Encounter (Signed)
Noted. thx 

## 2018-12-02 DIAGNOSIS — Z853 Personal history of malignant neoplasm of breast: Secondary | ICD-10-CM | POA: Diagnosis not present

## 2018-12-02 DIAGNOSIS — C50911 Malignant neoplasm of unspecified site of right female breast: Secondary | ICD-10-CM | POA: Diagnosis not present

## 2018-12-22 DIAGNOSIS — Z800 Family history of malignant neoplasm of digestive organs: Secondary | ICD-10-CM | POA: Diagnosis not present

## 2018-12-22 DIAGNOSIS — Z801 Family history of malignant neoplasm of trachea, bronchus and lung: Secondary | ICD-10-CM | POA: Diagnosis not present

## 2018-12-22 DIAGNOSIS — Z853 Personal history of malignant neoplasm of breast: Secondary | ICD-10-CM | POA: Diagnosis not present

## 2018-12-22 DIAGNOSIS — Z8049 Family history of malignant neoplasm of other genital organs: Secondary | ICD-10-CM | POA: Diagnosis not present

## 2018-12-22 DIAGNOSIS — Z9013 Acquired absence of bilateral breasts and nipples: Secondary | ICD-10-CM | POA: Diagnosis not present

## 2019-01-28 MED FILL — LEVOTHYROXINE 125 MCG TAB: 125 | 90 days supply | Qty: 90 | Fill #0

## 2019-02-08 DIAGNOSIS — M79674 Pain in right toe(s): Secondary | ICD-10-CM | POA: Diagnosis not present

## 2019-02-08 DIAGNOSIS — L600 Ingrowing nail: Secondary | ICD-10-CM | POA: Diagnosis not present

## 2019-02-08 DIAGNOSIS — M79675 Pain in left toe(s): Secondary | ICD-10-CM | POA: Diagnosis not present

## 2019-02-08 DIAGNOSIS — B351 Tinea unguium: Secondary | ICD-10-CM | POA: Diagnosis not present

## 2019-02-17 ENCOUNTER — Other Ambulatory Visit: Payer: Self-pay | Admitting: Family Medicine

## 2019-02-17 DIAGNOSIS — E785 Hyperlipidemia, unspecified: Secondary | ICD-10-CM

## 2019-02-17 MED FILL — LETROZOLE 2.5 MG TABS: 2.5 | 90 days supply | Qty: 90 | Fill #0

## 2019-02-17 NOTE — Telephone Encounter (Signed)
Requested medication (s) are due for refill today: yes  Requested medication (s) are on the active medication list: yes  Last refill:  08/03/2018  Future visit scheduled: no  Notes to clinic:  Review for refill   Requested Prescriptions  Pending Prescriptions Disp Refills   rosuvastatin (CRESTOR) 20 MG tablet [Pharmacy Med Name: ROSUVASTATIN CALCIUM 20 MG 20 TAB] 90 tablet 0    Sig: TAKE 1 TABLET BY MOUTH DAILY.     Cardiovascular:  Antilipid - Statins Failed - 02/17/2019  2:08 PM      Failed - Triglycerides in normal range and within 360 days    Triglycerides  Date Value Ref Range Status  07/27/2018 369 (H) 0 - 149 mg/dL Final         Passed - Total Cholesterol in normal range and within 360 days    Cholesterol, Total  Date Value Ref Range Status  07/27/2018 164 100 - 199 mg/dL Final         Passed - LDL in normal range and within 360 days    LDL Calculated  Date Value Ref Range Status  07/27/2018 46 0 - 99 mg/dL Final         Passed - HDL in normal range and within 360 days    HDL  Date Value Ref Range Status  07/27/2018 44 >39 mg/dL Final         Passed - Patient is not pregnant      Passed - Valid encounter within last 12 months    Recent Outpatient Visits          6 months ago Mixed hyperlipidemia   [facility], Ranell Patrick, MD   2 years ago Enlarged and hypertrophic nails   Primary Care at [facility], [place], [place]

## 2019-02-19 MED FILL — ROSUVASTATIN CALCIUM 20 MG: 20 | 30 days supply | Qty: 30 | Fill #0

## 2019-02-24 DIAGNOSIS — B351 Tinea unguium: Secondary | ICD-10-CM | POA: Diagnosis not present

## 2019-02-24 DIAGNOSIS — M79674 Pain in right toe(s): Secondary | ICD-10-CM | POA: Diagnosis not present

## 2019-02-24 DIAGNOSIS — M79675 Pain in left toe(s): Secondary | ICD-10-CM | POA: Diagnosis not present

## 2019-03-09 MED FILL — ROSUVASTATIN CALCIUM 20 MG: 20 | 30 days supply | Qty: 30 | Fill #0

## 2019-03-12 ENCOUNTER — Telehealth: Payer: Self-pay | Admitting: Family Medicine

## 2019-03-12 ENCOUNTER — Other Ambulatory Visit: Payer: Self-pay | Admitting: Family Medicine

## 2019-03-12 DIAGNOSIS — F418 Other specified anxiety disorders: Secondary | ICD-10-CM

## 2019-03-12 NOTE — Telephone Encounter (Signed)
Requested medication (s) are due for refill today: yes  Requested medication (s) are on the active medication list: yes  Last refill:  11/19/2018  Future visit scheduled: no  Notes to clinic:  Review for refill   Requested Prescriptions  Pending Prescriptions Disp Refills   escitalopram (LEXAPRO) 20 MG tablet [Pharmacy Med Name: ESCITALOPRAM 20 MG TABLET 20 TAB] 90 tablet 1    Sig: TAKE 1 TABLET BY MOUTH DAILY.     Psychiatry:  Antidepressants - SSRI Failed - 03/12/2019  2:12 PM      Failed - Valid encounter within last 6 months    Recent Outpatient Visits          7 months ago Mixed hyperlipidemia   [facility], Ranell Patrick, MD   2 years ago Enlarged and hypertrophic nails   Primary Care at [facility], [place], [place]             Failed - Completed PHQ-2 or PHQ-9 in the last 360 days.

## 2019-03-12 NOTE — Telephone Encounter (Signed)
Patient stated that she called her pharmacy to get her medication refilled and they denied her but it states she has one refill left until 07/2019  Needing refill until she can get an appnt to see him will check her schedule for appnt   Patient would like a phone call [facility]

## 2019-03-16 ENCOUNTER — Other Ambulatory Visit: Payer: Self-pay | Admitting: Family Medicine

## 2019-03-16 DIAGNOSIS — F418 Other specified anxiety disorders: Secondary | ICD-10-CM

## 2019-03-16 NOTE — Telephone Encounter (Signed)
Requested medication (s) are due for refill today: yes  Requested medication (s) are on the active medication list: yes  Last refill:  11/19/2018  Future visit scheduled: yes  Notes to clinic: review for refill   Requested Prescriptions  Pending Prescriptions Disp Refills   escitalopram (LEXAPRO) 20 MG tablet [Pharmacy Med Name: ESCITALOPRAM 20 MG TABLET 20 TAB] 90 tablet 1    Sig: TAKE 1 TABLET BY MOUTH DAILY.     Psychiatry:  Antidepressants - SSRI Failed - 03/16/2019  1:34 PM      Failed - Valid encounter within last 6 months    Recent Outpatient Visits          7 months ago Mixed hyperlipidemia   [facility], Ranell Patrick, MD   2 years ago Enlarged and hypertrophic nails   Primary Care at [facility], [place], [place]      Future Appointments            In 3 days Wendie Agreste, MD [facility] at [place], [facility]           Failed - Completed PHQ-2 or PHQ-9 in the last 360 days.

## 2019-03-17 ENCOUNTER — Ambulatory Visit: Payer: 59

## 2019-03-17 MED FILL — ESCITALOPRAM 20 MG TABLET: 20 | 90 days supply | Qty: 90 | Fill #0

## 2019-03-19 ENCOUNTER — Ambulatory Visit (INDEPENDENT_AMBULATORY_CARE_PROVIDER_SITE_OTHER): Payer: 59 | Admitting: Family Medicine

## 2019-03-19 ENCOUNTER — Ambulatory Visit: Payer: 59 | Admitting: Family Medicine

## 2019-03-19 ENCOUNTER — Other Ambulatory Visit: Payer: Self-pay

## 2019-03-19 DIAGNOSIS — R739 Hyperglycemia, unspecified: Secondary | ICD-10-CM | POA: Diagnosis not present

## 2019-03-19 DIAGNOSIS — E781 Pure hyperglyceridemia: Secondary | ICD-10-CM

## 2019-03-19 LAB — COMPREHENSIVE METABOLIC PANEL
ALT: 51 IU/L — ABNORMAL HIGH (ref 0–32)
AST: 54 IU/L — ABNORMAL HIGH (ref 0–40)
Albumin/Globulin Ratio: 1.7 (ref 1.2–2.2)
Albumin: 4.5 g/dL (ref 3.8–4.8)
Alkaline Phosphatase: 74 IU/L (ref 39–117)
BUN/Creatinine Ratio: 19 (ref 12–28)
BUN: 14 mg/dL (ref 8–27)
Bilirubin Total: 0.3 mg/dL (ref 0.0–1.2)
CO2: 21 mmol/L (ref 20–29)
Calcium: 10 mg/dL (ref 8.7–10.3)
Chloride: 102 mmol/L (ref 96–106)
Creatinine, Ser: 0.72 mg/dL (ref 0.57–1.00)
GFR calc Af Amer: 104 mL/min/{1.73_m2} (ref >59)
GFR calc non Af Amer: 90 mL/min/{1.73_m2} (ref >59)
Globulin, Total: 2.7 g/dL (ref 1.5–4.5)
Glucose: 118 mg/dL — ABNORMAL HIGH (ref 65–99)
Potassium: 4.7 mmol/L (ref 3.5–5.2)
Sodium: 139 mmol/L (ref 134–144)
Total Protein: 7.2 g/dL (ref 6.0–8.5)

## 2019-03-19 LAB — HEMOGLOBIN A1C
Est. average glucose Bld gHb Est-mCnc: 137 mg/dL
Hgb A1c MFr Bld: 6.4 % — ABNORMAL HIGH (ref 4.8–5.6)

## 2019-03-19 LAB — LIPID PANEL
Chol/HDL Ratio: 6.5 ratio — ABNORMAL HIGH (ref 0.0–4.4)
Cholesterol, Total: 287 mg/dL — ABNORMAL HIGH (ref 100–199)
HDL: 44 mg/dL (ref >39)
LDL Chol Calc (NIH): 196 mg/dL — ABNORMAL HIGH (ref 0–99)
Triglycerides: 240 mg/dL — ABNORMAL HIGH (ref 0–149)
VLDL Cholesterol Cal: 47 mg/dL — ABNORMAL HIGH (ref 5–40)

## 2019-03-25 ENCOUNTER — Other Ambulatory Visit: Payer: Self-pay

## 2019-03-25 ENCOUNTER — Ambulatory Visit: Payer: 59 | Admitting: Family Medicine

## 2019-03-25 ENCOUNTER — Encounter: Payer: Self-pay | Admitting: Family Medicine

## 2019-03-25 VITALS — BP 123/79 | HR 71 | Temp 98.6°F | Wt 210.6 lb

## 2019-03-25 DIAGNOSIS — F418 Other specified anxiety disorders: Secondary | ICD-10-CM

## 2019-03-25 DIAGNOSIS — Z23 Encounter for immunization: Secondary | ICD-10-CM

## 2019-03-25 DIAGNOSIS — E785 Hyperlipidemia, unspecified: Secondary | ICD-10-CM

## 2019-03-25 DIAGNOSIS — E039 Hypothyroidism, unspecified: Secondary | ICD-10-CM | POA: Diagnosis not present

## 2019-03-25 MED ORDER — LEVOTHYROXINE SODIUM 125 MCG PO TABS: 125.0000 ug | ORAL_TABLET | Freq: Every day | ORAL | 3 refills | Status: DC

## 2019-03-25 MED ORDER — ESCITALOPRAM OXALATE 20 MG PO TABS: 20.0000 mg | ORAL_TABLET | Freq: Every day | ORAL | 3 refills | Status: DC

## 2019-03-25 MED ORDER — ROSUVASTATIN CALCIUM 40 MG PO TABS: 40.0000 mg | ORAL_TABLET | Freq: Every day | ORAL | 3 refills | Status: DC

## 2019-03-25 MED FILL — ROSUVASTATIN CALCIUM 40 MG: 40 | 90 days supply | Qty: 90 | Fill #0

## 2019-03-25 NOTE — Progress Notes (Signed)
Subjective:    Patient ID: Brittany Vincent, female    DOB: 08/17/1955, 63 y.o.   MRN: [id number]  HPI Brittany Vincent is a 63 y.o. female Presents today for: Chief Complaint  Patient presents with  . Hyperlipidemia    Here for medical review and refill on medication   Hypothyroidism: Lab Results  Component Value Date   TSH 3.290 07/27/2018  Taking medication daily.  No new hot or cold intolerance. No new hair or skin changes, heart palpitations or new fatigue. No new weight changes.  synthroid 189mcg QD. Same dose for years. On meds 29 yrs.    Hyperlipidemia:  Lab Results  Component Value Date   CHOL 287 (H) 03/19/2019   HDL 44 03/19/2019   LDLCALC 196 (H) 03/19/2019   TRIG 240 (H) 03/19/2019   CHOLHDL 6.5 (H) 03/19/2019   Lab Results  Component Value Date   ALT 51 (H) 03/19/2019   AST 54 (H) 03/19/2019   ALKPHOS 74 03/19/2019   BILITOT 0.3 03/19/2019  on Crestor 20mg  qd since last visit. At that time thought to be on 20 mg, but she was actually taking 40mg . Tolerating prior dose. Fasting labs last week.    Anxiety: Situational anxiety.  Managed with Lexapro 20mg  qd.  No new side effects with Lexapro. Feels like this has been doing ok.  Work has been stressful. Now working at [employer].  Planning on cutting back to 2 12 hr days per week with occasional 3rd day - 8hr.   Depression screen [facility] 2/9 03/25/2019 04/29/2016  Decreased Interest 0 0  Down, Depressed, Hopeless 0 0  PHQ - 2 Score 0 0   No flowsheet data found.   Patient Active Problem List   Diagnosis Date Noted  . History of breast cancer 05/01/2016  . Hypothyroidism 05/01/2016  . Hyperlipidemia 05/01/2016   Past Medical History:  Diagnosis Date  . Breast cancer ([place]) 2013  . Depression   . Hyperlipidemia   . Thyroid disease    Past Surgical History:  Procedure Laterality Date  . BREAST SURGERY    . BUNIONECTOMY Right    No Known Allergies Prior to Admission medications   Medication Sig Start  Date End Date Taking? Authorizing Provider  Calcium Carbonate-Vit D-Min (CALTRATE 600+D PLUS MINERALS) 600-800 MG-UNIT CHEW Chew by mouth.   Yes [provider]  Cholecalciferol (VITAMIN D) 50 MCG (2000 UT) CAPS Take by mouth.   Yes [provider]  escitalopram (LEXAPRO) 20 MG tablet TAKE 1 TABLET BY MOUTH DAILY. 03/17/19  Yes Wendie Agreste, MD  letrozole [facility]) 2.5 MG tablet Take 2.5 mg by mouth daily.   Yes [provider]  letrozole (FEMARA) 2.5 MG tablet Take 2.5 mg by mouth daily.   Yes [provider]  levothyroxine (SYNTHROID, LEVOTHROID) 125 MCG tablet Take 1 tablet (125 mcg total) by mouth daily before breakfast. 07/27/18  Yes Wendie Agreste, MD  Multiple Vitamins-Minerals (MULTIVITAMIN WITH IRON-MINERALS) liquid Take by mouth daily.   Yes [provider]  rosuvastatin (CRESTOR) 20 MG tablet TAKE 1 TABLET BY MOUTH DAILY. 02/19/19  Yes Wendie Agreste, MD   Social History   Socioeconomic History  . Marital status: Married    Spouse name: Not on file  . Number of children: 3  . Years of education: Not on file  . Highest education level: Not on file  Occupational History  . Occupation: Therapist, sports  Social Needs  . Financial resource strain: Not on file  .  Food insecurity    Worry: Not on file    Inability: Not on file  . Transportation needs    Medical: Not on file    Non-medical: Not on file  Tobacco Use  . Smoking status: Never Smoker  . Smokeless tobacco: Never Used  Substance and Sexual Activity  . Alcohol use: No  . Drug use: No  . Sexual activity: Not on file  Lifestyle  . Physical activity    Days per week: Not on file    Minutes per session: Not on file  . Stress: Not on file  Relationships  . Social Herbalist on phone: Not on file    Gets together: Not on file    Attends religious service: Not on file    Active member of club or organization: Not on file    Attends meetings of clubs or organizations:  Not on file    Relationship status: Not on file  . Intimate partner violence    Fear of current or ex partner: Not on file    Emotionally abused: Not on file    Physically abused: Not on file    Forced sexual activity: Not on file  Other Topics Concern  . Not on file  Social History Narrative   Lives with her husband.   Older son lives in [place], [place] with his wife and their 2 children.   Younger Nature conservation officer) son lives in [place], with his wife Lisbeth Renshaw) and their daughter, Ava.   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   Daughter recently moved to [place].   Moved to [place] from [place], [place] to be nearer to [place], [place] and Ava.    Review of Systems  Constitutional: Negative for fatigue and unexpected weight change.  Respiratory: Negative for chest tightness and shortness of breath.   Cardiovascular: Negative for chest pain, palpitations and leg swelling.  Gastrointestinal: Negative for abdominal pain and blood in stool.  Neurological: Negative for dizziness, syncope, light-headedness and headaches.       Objective:   Physical Exam Vitals signs reviewed.  Constitutional:      Appearance: She is well-developed.  HENT:     Head: Normocephalic and atraumatic.  Eyes:     Conjunctiva/sclera: Conjunctivae normal.     Pupils: Pupils are equal, round, and reactive to light.  Neck:     Vascular: No carotid bruit.  Cardiovascular:     Rate and Rhythm: Normal rate and regular rhythm.     Heart sounds: Normal heart sounds.  Pulmonary:     Effort: Pulmonary effort is normal.     Breath sounds: Normal breath sounds.  Abdominal:     Palpations: Abdomen is soft. There is no pulsatile mass.     Tenderness: There is no abdominal tenderness.  Skin:    General: Skin is warm and dry.  Neurological:     Mental Status: She is alert and oriented to person, place, and time.  Psychiatric:        Behavior: Behavior normal.    Vitals:   03/25/19 1116  BP: 123/79   Pulse: 71  Temp: 98.6 F (37 C)  TempSrc: Oral  SpO2: 97%  Weight: 210 lb 9.6 oz (95.5 kg)      Assessment & Plan:   Brittany Vincent is a 63 y.o. female Hypothyroidism, unspecified type - Plan: levothyroxine (SYNTHROID) 125 MCG tablet, TSH  -Stable dose for years.  Continue Synthroid same dose, TSH entered  as ordered for future labs.  Hyperlipidemia, unspecified hyperlipidemia type - Plan: rosuvastatin (CRESTOR) 40 MG tablet, Lipid panel, Hepatic Function Panel  -Inadvertently had decreased to 20 mg Crestor at her visit earlier this year.  Had been tolerating 40 mg previously.  Restart 40 mg with lab only visit in 6 weeks.  Situational anxiety - Plan: escitalopram (LEXAPRO) 20 MG tablet  -Overall stable on Lexapro 20 mg daily.  Suspect cutting back on hours at work will also be helpful.  Need for prophylactic vaccination and inoculation against influenza - Plan: Flu Vaccine QUAD 6+ mos PF IM (Fluarix Quad PF)   Meds ordered this encounter  Medications  . levothyroxine (SYNTHROID) 125 MCG tablet    Sig: Take 1 tablet (125 mcg total) by mouth daily before breakfast.    Dispense:  90 tablet    Refill:  3  . escitalopram (LEXAPRO) 20 MG tablet    Sig: Take 1 tablet (20 mg total) by mouth daily.    Dispense:  90 tablet    Refill:  3  . rosuvastatin (CRESTOR) 40 MG tablet    Sig: Take 1 tablet (40 mg total) by mouth daily.    Dispense:  90 tablet    Refill:  3   Patient Instructions   Crestor returned to the 40 mg dose.  No other changes for now.  Follow-up anytime after 6 weeks for fasting labs to repeat cholesterol levels as well as thyroid test at that time.  Thanks for coming in today and let me know if there are questions.    If you have lab work done today you will be contacted with your lab results within the next 2 weeks.  If you have not heard from [place] then please contact us. The fastest way to get your results is to register for My Chart.   IF you received an x-ray  today, you will receive an invoice from [facility]. Please contact [facility] at [phone] with questions or concerns regarding your invoice.   IF you received labwork today, you will receive an invoice from [facility]. Please contact [facility] at [phone] with questions or concerns regarding your invoice.   Our billing staff will not be able to assist you with questions regarding bills from these companies.  You will be contacted with the lab results as soon as they are available. The fastest way to get your results is to activate your My Chart account. Instructions are located on the last page of this paperwork. If you have not heard from [place] regarding the results in 2 weeks, please contact this office.       Signed,   Merri Ray, MD [facility] [place].  03/25/19 12:32 PM

## 2019-03-25 NOTE — Patient Instructions (Addendum)
Crestor returned to the 40 mg dose.  No other changes for now.  Follow-up anytime after 6 weeks for fasting labs to repeat cholesterol levels as well as thyroid test at that time.  Thanks for coming in today and let me know if there are questions.    If you have lab work done today you will be contacted with your lab results within the next 2 weeks.  If you have not heard from [place] then please contact us. The fastest way to get your results is to register for My Chart.   IF you received an x-ray today, you will receive an invoice from [facility]. Please contact [facility] at [phone] with questions or concerns regarding your invoice.   IF you received labwork today, you will receive an invoice from [facility]. Please contact [facility] at [phone] with questions or concerns regarding your invoice.   Our billing staff will not be able to assist you with questions regarding bills from these companies.  You will be contacted with the lab results as soon as they are available. The fastest way to get your results is to activate your My Chart account. Instructions are located on the last page of this paperwork. If you have not heard from [place] regarding the results in 2 weeks, please contact this office.

## 2019-04-02 DIAGNOSIS — B351 Tinea unguium: Secondary | ICD-10-CM | POA: Diagnosis not present

## 2019-05-05 MED FILL — LEVOTHYROXINE 125 MCG TAB: 125 | 90 days supply | Qty: 90 | Fill #0

## 2019-05-12 MED FILL — LETROZOLE 2.5 MG TABS: 2.5 | 90 days supply | Qty: 90 | Fill #1

## 2019-06-14 MED FILL — ESCITALOPRAM 20 MG TABLET: 20 | 90 days supply | Qty: 90 | Fill #0

## 2019-06-14 MED FILL — ROSUVASTATIN CALCIUM 40 MG: 40 | 90 days supply | Qty: 90 | Fill #1

## 2019-08-16 MED FILL — LETROZOLE 2.5 MG TABS: 2.5 | 90 days supply | Qty: 90 | Fill #0

## 2019-08-23 MED FILL — LEVOTHYROXINE SODIUM 125 MC: 125 | 90 days supply | Qty: 90 | Fill #1

## 2019-09-20 DIAGNOSIS — C50911 Malignant neoplasm of unspecified site of right female breast: Secondary | ICD-10-CM | POA: Diagnosis not present

## 2019-09-20 DIAGNOSIS — M8588 Other specified disorders of bone density and structure, other site: Secondary | ICD-10-CM | POA: Diagnosis not present

## 2019-09-20 MED FILL — ROSUVASTATIN CALCIUM 40 MG: 40 | 90 days supply | Qty: 90 | Fill #2

## 2019-10-12 ENCOUNTER — Ambulatory Visit: Payer: 59

## 2019-10-13 ENCOUNTER — Telehealth: Payer: Self-pay

## 2019-10-13 DIAGNOSIS — E039 Hypothyroidism, unspecified: Secondary | ICD-10-CM

## 2019-10-13 DIAGNOSIS — E781 Pure hyperglyceridemia: Secondary | ICD-10-CM

## 2019-10-13 DIAGNOSIS — E785 Hyperlipidemia, unspecified: Secondary | ICD-10-CM

## 2019-10-13 DIAGNOSIS — Z131 Encounter for screening for diabetes mellitus: Secondary | ICD-10-CM

## 2019-10-18 ENCOUNTER — Telehealth: Payer: Self-pay | Admitting: Family Medicine

## 2019-10-18 ENCOUNTER — Encounter: Payer: 59 | Admitting: Family Medicine

## 2019-10-18 MED FILL — ESCITALOPRAM 20 MG TABLET: 20 | 90 days supply | Qty: 90 | Fill #1

## 2019-10-18 NOTE — Telephone Encounter (Signed)
Pt needs lab orders placed for her cpe on 5/24 her nurse visit is for 5/18 for labs.

## 2019-10-18 NOTE — Telephone Encounter (Signed)
Please Advise

## 2019-10-18 NOTE — Telephone Encounter (Signed)
Labs have been pended awaiting providers approval.

## 2019-10-20 ENCOUNTER — Encounter: Payer: 59 | Admitting: Family Medicine

## 2019-10-29 NOTE — Telephone Encounter (Signed)
labs have been future ordered

## 2019-11-09 ENCOUNTER — Ambulatory Visit (INDEPENDENT_AMBULATORY_CARE_PROVIDER_SITE_OTHER): Payer: 59 | Admitting: Family Medicine

## 2019-11-09 ENCOUNTER — Other Ambulatory Visit: Payer: Self-pay

## 2019-11-09 ENCOUNTER — Encounter: Payer: Self-pay | Admitting: Family Medicine

## 2019-11-09 DIAGNOSIS — E039 Hypothyroidism, unspecified: Secondary | ICD-10-CM

## 2019-11-09 DIAGNOSIS — E781 Pure hyperglyceridemia: Secondary | ICD-10-CM

## 2019-11-09 DIAGNOSIS — Z131 Encounter for screening for diabetes mellitus: Secondary | ICD-10-CM

## 2019-11-09 DIAGNOSIS — E785 Hyperlipidemia, unspecified: Secondary | ICD-10-CM

## 2019-11-09 NOTE — Progress Notes (Signed)
Lab only visit 

## 2019-11-10 LAB — HEMOGLOBIN A1C
Est. average glucose Bld gHb Est-mCnc: 160 mg/dL
Hgb A1c MFr Bld: 7.2 % — ABNORMAL HIGH (ref 4.8–5.6)

## 2019-11-10 LAB — COMPREHENSIVE METABOLIC PANEL
ALT: 54 IU/L — ABNORMAL HIGH (ref 0–32)
AST: 88 IU/L — ABNORMAL HIGH (ref 0–40)
Albumin/Globulin Ratio: 1.6 (ref 1.2–2.2)
Albumin: 4.5 g/dL (ref 3.8–4.8)
Alkaline Phosphatase: 79 IU/L (ref 48–121)
BUN/Creatinine Ratio: 18 (ref 12–28)
BUN: 15 mg/dL (ref 8–27)
Bilirubin Total: 0.4 mg/dL (ref 0.0–1.2)
CO2: 22 mmol/L (ref 20–29)
Calcium: 9.7 mg/dL (ref 8.7–10.3)
Chloride: 101 mmol/L (ref 96–106)
Creatinine, Ser: 0.82 mg/dL (ref 0.57–1.00)
GFR calc Af Amer: 88 mL/min/{1.73_m2} (ref >59)
GFR calc non Af Amer: 76 mL/min/{1.73_m2} (ref >59)
Globulin, Total: 2.9 g/dL (ref 1.5–4.5)
Glucose: 140 mg/dL — ABNORMAL HIGH (ref 65–99)
Potassium: 4.4 mmol/L (ref 3.5–5.2)
Sodium: 140 mmol/L (ref 134–144)
Total Protein: 7.4 g/dL (ref 6.0–8.5)

## 2019-11-10 LAB — LIPID PANEL
Chol/HDL Ratio: 3.7 ratio (ref 0.0–4.4)
Cholesterol, Total: 172 mg/dL (ref 100–199)
HDL: 46 mg/dL (ref >39)
LDL Chol Calc (NIH): 102 mg/dL — ABNORMAL HIGH (ref 0–99)
Triglycerides: 136 mg/dL (ref 0–149)
VLDL Cholesterol Cal: 24 mg/dL (ref 5–40)

## 2019-11-10 LAB — TSH: TSH: 7.77 u[IU]/mL — ABNORMAL HIGH (ref 0.450–4.500)

## 2019-11-10 LAB — HEPATIC FUNCTION PANEL: Bilirubin, Direct: 0.11 mg/dL (ref 0.00–0.40)

## 2019-11-15 ENCOUNTER — Other Ambulatory Visit: Payer: Self-pay

## 2019-11-15 ENCOUNTER — Encounter: Payer: Self-pay | Admitting: Family Medicine

## 2019-11-15 ENCOUNTER — Ambulatory Visit (INDEPENDENT_AMBULATORY_CARE_PROVIDER_SITE_OTHER): Payer: 59 | Admitting: Family Medicine

## 2019-11-15 VITALS — BP 119/79 | HR 87 | Temp 98.1°F | Resp 15 | Ht 67.0 in | Wt 214.0 lb

## 2019-11-15 DIAGNOSIS — E1165 Type 2 diabetes mellitus with hyperglycemia: Secondary | ICD-10-CM

## 2019-11-15 DIAGNOSIS — E785 Hyperlipidemia, unspecified: Secondary | ICD-10-CM

## 2019-11-15 DIAGNOSIS — F418 Other specified anxiety disorders: Secondary | ICD-10-CM

## 2019-11-15 DIAGNOSIS — Z0000 Encounter for general adult medical examination without abnormal findings: Secondary | ICD-10-CM

## 2019-11-15 DIAGNOSIS — R7989 Other specified abnormal findings of blood chemistry: Secondary | ICD-10-CM

## 2019-11-15 DIAGNOSIS — E782 Mixed hyperlipidemia: Secondary | ICD-10-CM | POA: Diagnosis not present

## 2019-11-15 DIAGNOSIS — Z0001 Encounter for general adult medical examination with abnormal findings: Secondary | ICD-10-CM

## 2019-11-15 DIAGNOSIS — E039 Hypothyroidism, unspecified: Secondary | ICD-10-CM | POA: Diagnosis not present

## 2019-11-15 MED ORDER — BLOOD GLUCOSE METER KIT: PACK | 0 refills | Status: AC

## 2019-11-15 MED ORDER — ROSUVASTATIN CALCIUM 40 MG PO TABS: 40.0000 mg | ORAL_TABLET | Freq: Every day | ORAL | 3 refills | Status: DC

## 2019-11-15 MED ORDER — METFORMIN HCL 500 MG PO TABS: 500.0000 mg | ORAL_TABLET | Freq: Every day | ORAL | 1 refills | Status: DC

## 2019-11-15 MED ORDER — ESCITALOPRAM OXALATE 20 MG PO TABS: 20.0000 mg | ORAL_TABLET | Freq: Every day | ORAL | 3 refills | Status: DC

## 2019-11-15 MED ORDER — LEVOTHYROXINE SODIUM 137 MCG PO TABS: 137.0000 ug | ORAL_TABLET | Freq: Every day | ORAL | 1 refills | Status: DC

## 2019-11-15 NOTE — Progress Notes (Signed)
Subjective:  Patient ID: Brittany Vincent, female    DOB: Feb 05, 1956  Age: 64 y.o. MRN: [id number]  CC:  Chief Complaint  Patient presents with  . Annual Exam    go over labwork has a few concerns. would like an apetite suppressant, pt has been gaining weight recently with trouble controlling apetite     HPI Brittany Vincent presents for  Annual exam and med review.  Hypothyroidism: Lab Results  Component Value Date   TSH 7.770 (H) 11/09/2019  Taking medication daily.  Synthroid 125 mcg daily No new hot or cold intolerance. No new hair or skin changes, heart palpitations or new fatigue. Up 30# on home readings with decreased exercise.   Anxiety: Plan decrease work hours in October 2020.  Continued on Lexapro 20 mg daily at that time.  Doing ok. Looking forward to retirement in 15 months.  New job. Still some frustration.   No flowsheet data found. Depression screen [facility] 2/9 11/15/2019 03/25/2019 04/29/2016  Decreased Interest 0 0 0  Down, Depressed, Hopeless 0 0 0  PHQ - 2 Score 0 0 0    Elevated LFTs Lab Results  Component Value Date   ALT 54 (H) 11/09/2019   AST 88 (H) 11/09/2019   ALKPHOS 79 11/09/2019   BILITOT 0.4 11/09/2019   AST 54, ALT 51 in September 2020. Body mass index is 33.52 kg/m.   Prediabetes/Diabetes/obesity: Body mass index is 33.52 kg/m. A1c previously 6.4 in September 2020, recent labs with increased level, now at diabetic range. New job - working form home  Since 11/30 - 40 hrs per week sitting.  No regular exercise prior - started walking last night.  Portion control and walking helped in past with weight loss.  Asking about appetite suppressant possibility now.   Lab Results  Component Value Date   HGBA1C 7.2 (H) 11/09/2019   Wt Readings from Last 3 Encounters:  11/15/19 214 lb (97.1 kg)  03/25/19 210 lb 9.6 oz (95.5 kg)  07/27/18 209 lb 12.8 oz (95.2 kg)   Hyperlipidemia: Crestor 40 mg daily.  LDL up to 196 in September 2020. No new  myalgias.  Lab Results  Component Value Date   CHOL 172 11/09/2019   HDL 46 11/09/2019   LDLCALC 102 (H) 11/09/2019   TRIG 136 11/09/2019   CHOLHDL 3.7 11/09/2019   Lab Results  Component Value Date   ALT 54 (H) 11/09/2019   AST 88 (H) 11/09/2019   ALKPHOS 79 11/09/2019   BILITOT 0.4 11/09/2019   Cancer Screening: Colon:01/22/17 - 9yr, will plan on 552yrwith FH Breast CA - had breast cancer in 2013. U/s and MMG in [place] in July.  [facility] CeVella Redhead [facility]  Immunization History  Administered Date(s) Administered  . Influenza,inj,Quad PF,6+ Mos 03/25/2019  . PFIZER SARS-COV-2 Vaccination 08/04/2019, 08/25/2019    Hearing Screening   125Hz  250Hz  500Hz  1000Hz  2000Hz  3000Hz  4000Hz  6000Hz  8000Hz   Right ear:           Left ear:             Visual Acuity Screening   Right eye Left eye Both eyes  Without correction:     With correction: 20/25 20/25 20/25   due for appt - 1.5y96yrwill schedule appt. GroWarden/rangerDental: every 6 months. appt next week.    Exercise: started walking as above.  History Patient Active Problem List   Diagnosis Date Noted  . History of breast cancer 05/01/2016  . Hypothyroidism 05/01/2016  . Hyperlipidemia 05/01/2016   Past Medical History:  Diagnosis Date  . Breast cancer ([place]) 2013  . Depression   . Hyperlipidemia   . Thyroid disease    Past Surgical History:  Procedure Laterality Date  . BREAST SURGERY    . BUNIONECTOMY Right    No Known Allergies Prior to Admission medications   Medication Sig Start Date End Date Taking? Authorizing Provider  Calcium Carbonate-Vit D-Min (CALTRATE 600+D PLUS MINERALS) 600-800 MG-UNIT CHEW Chew by mouth.   Yes [provider]  Cholecalciferol (VITAMIN D) 50 MCG (2000 UT) CAPS Take by mouth.   Yes [provider]  escitalopram (LEXAPRO) 20 MG tablet Take 1 tablet (20 mg  total) by mouth daily. 03/25/19  Yes Wendie Agreste, MD  letrozole [facility]) 2.5 MG tablet Take 2.5 mg by mouth daily.   Yes [provider]  levothyroxine (SYNTHROID) 125 MCG tablet Take 1 tablet (125 mcg total) by mouth daily before breakfast. 03/25/19  Yes Wendie Agreste, MD  Multiple Vitamins-Minerals (MULTIVITAMIN WITH IRON-MINERALS) liquid Take by mouth daily.   Yes [provider]  rosuvastatin (CRESTOR) 40 MG tablet Take 1 tablet (40 mg total) by mouth daily. 03/25/19  Yes Wendie Agreste, MD   Social History   Socioeconomic History  . Marital status: Married    Spouse name: Not on file  . Number of children: 3  . Years of education: Not on file  . Highest education level: Not on file  Occupational History  . Occupation: Therapist, sports  Tobacco Use  . Smoking status: Never Smoker  . Smokeless tobacco: Never Used  Substance and Sexual Activity  . Alcohol use: No  . Drug use: No  . Sexual activity: Not on file  Other Topics Concern  . Not on file  Social History Narrative   Lives with her husband.   Older son lives in [place], [place] with his wife and their 2 children.   Younger Nature conservation officer) son lives in [place], with his wife Lisbeth Renshaw) and their daughter, Ava.   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   Daughter recently moved to [place].   Moved to [place] from [place], [place] to be nearer to [place], [place] and Ava.   Social Determinants of Health   Financial Resource Strain:   . Difficulty of Paying Living Expenses:   Food Insecurity:   . Worried About Charity fundraiser in the Last Year:   . Arboriculturist in the Last Year:   Transportation Needs:   . Film/video editor (Medical):   Marland Kitchen Lack of Transportation (Non-Medical):   Physical Activity:   . Days of Exercise per Week:   . Minutes of Exercise per Session:   Stress:   . Feeling of Stress :   Social Connections:   . Frequency of Communication with Friends and Family:   .  Frequency of Social Gatherings with Friends and Family:   . Attends Religious Services:   . Active Member of Clubs or Organizations:   . Attends Archivist Meetings:   Marland Kitchen Marital Status:   Intimate Partner Violence:   . Fear of Current or Ex-Partner:   . Emotionally Abused:   Marland Kitchen Physically Abused:   . Sexually Abused:     Review of Systems 13 point review of systems per patient health survey  noted.  Negative other than as indicated above or in HPI.    Objective:   Vitals:   11/15/19 1512  BP: 119/79  Pulse: 87  Resp: 15  Temp: 98.1 F (36.7 C)  TempSrc: Temporal  SpO2: 94%  Weight: 214 lb (97.1 kg)  Height: 5' 7"  (1.702 m)     Physical Exam Vitals reviewed.  Constitutional:      Appearance: She is well-developed.  HENT:     Head: Normocephalic and atraumatic.     Right Ear: External ear normal.     Left Ear: External ear normal.  Eyes:     Conjunctiva/sclera: Conjunctivae normal.     Pupils: Pupils are equal, round, and reactive to light.  Neck:     Thyroid: No thyromegaly.  Cardiovascular:     Rate and Rhythm: Normal rate and regular rhythm.     Heart sounds: Normal heart sounds. No murmur.  Pulmonary:     Effort: Pulmonary effort is normal. No respiratory distress.     Breath sounds: Normal breath sounds. No wheezing.  Abdominal:     General: Bowel sounds are normal. There is no distension.     Palpations: Abdomen is soft. There is no mass.     Tenderness: There is no abdominal tenderness. There is no guarding.  Musculoskeletal:        General: No tenderness. Normal range of motion.     Cervical back: Normal range of motion and neck supple.  Lymphadenopathy:     Cervical: No cervical adenopathy.  Skin:    General: Skin is warm and dry.     Findings: No rash.  Neurological:     Mental Status: She is alert and oriented to person, place, and time.  Psychiatric:        Behavior: Behavior normal.        Thought Content: Thought content normal.         Assessment & Plan:  Brittany Vincent is a 64 y.o. female . Annual physical exam  - -anticipatory guidance as below in AVS, screening labs above. Health maintenance items as above in HPI discussed/recommended as applicable.   Type 2 diabetes mellitus with hyperglycemia, without long-term current use of insulin ([name]) - Plan: metFORMIN (GLUCOPHAGE) 500 MG tablet, blood glucose meter kit and supplies, Microalbumin / creatinine urine ratio  -A1c now at diabetic level.  Plans on significant improved diet/exercise but medication also discussed.  We will start Metformin 500 mg daily for now, anticipate this could be discontinued in the future with improvements in weight and lowered A1c.  Potential effects discussed, recheck 6 weeks.  Potential fructosamine at that time.  Prescription for meter.  Phone number also provided for medical weight loss specialist/healthy weight and wellness.  Hyperlipidemia, unspecified hyperlipidemia type - Plan: rosuvastatin (CRESTOR) 40 MG tablet  Mixed hyperlipidemia  -Tolerating Crestor, slight elevation on recent labs but anticipate significant improvement with diet changes and weight changes.  No additional meds at this time.  Hypothyroidism, unspecified type - Plan: levothyroxine (SYNTHROID) 137 MCG tablet  -Slight elevated TSH, could also be contributing to difficulty with weight loss.  Increase Synthroid to 137 mcg daily, repeat testing in 6 weeks  Elevated LFTs - Plan: US Abdomen Limited RUQ  -Suspected nonalcoholic steatohepatitis is.  Check ultrasound, monitor LFTs with repeat testing  Situational anxiety - Plan: escitalopram (LEXAPRO) 20 MG tablet  -Stable, continue Lexapro   Meds ordered this encounter  Medications  . metFORMIN (GLUCOPHAGE) 500 MG tablet  Sig: Take 1 tablet (500 mg total) by mouth daily with breakfast.    Dispense:  90 tablet    Refill:  1  . blood glucose meter kit and supplies    Sig: Dispense based on patient and insurance  preference. Use up to four times daily as directed. (FOR ICD-10 E10.9, E11.9).    Dispense:  1 each    Refill:  0    New diagnosis diabetes. Check once per day.    Order Specific Question:   Number of strips    Answer:   100    Order Specific Question:   Number of lancets    Answer:   100  . levothyroxine (SYNTHROID) 137 MCG tablet    Sig: Take 1 tablet (137 mcg total) by mouth daily before breakfast.    Dispense:  90 tablet    Refill:  1  . escitalopram (LEXAPRO) 20 MG tablet    Sig: Take 1 tablet (20 mg total) by mouth daily.    Dispense:  90 tablet    Refill:  3  . rosuvastatin (CRESTOR) 40 MG tablet    Sig: Take 1 tablet (40 mg total) by mouth daily.    Dispense:  90 tablet    Refill:  3   Patient Instructions   I do believe the A1c will go down with diet and exercise. We can start low dose metformin for now.   Dennard Nip, MD Medical Weight Loss Management  - healthy weight and wellness.  . [phone]  Higher dose of Synthroid for now and can repeat testing including liver testing in 6 weeks.  I did order an ultrasound of the liver as well.  Let me know if there are questions from today's visit.   Type 2 Diabetes Mellitus, Self Care, Adult Caring for yourself after you have been diagnosed with type 2 diabetes (type 2 diabetes mellitus) means keeping your blood sugar (glucose) under control with a balance of:  Nutrition.  Exercise.  Lifestyle changes.  Medicines or insulin, if necessary.  Support from your team of health care providers and others. The following information explains what you need to know to manage your diabetes at home. What are the risks? Having diabetes can put you at risk for other long-term (chronic) conditions, such as heart disease and kidney disease. Your health care provider may prescribe medicines to help prevent complications from diabetes. These medicines may include:  Aspirin.  Medicine to lower cholesterol.  Medicine to  control blood pressure. How to monitor blood glucose   Check your blood glucose every day, as often as told by your health care provider.  Have your A1c (hemoglobin A1c) level checked two or more times a year, or as often as told by your health care provider. Your health care provider will set individualized treatment goals for you. Generally, the goal of treatment is to maintain the following blood glucose levels:  Before meals (preprandial): 80-130 mg/dL (4.4-7.2 mmol/L).  After meals (postprandial): below 180 mg/dL (10 mmol/L).  A1c level: less than 7%. How to manage hyperglycemia and hypoglycemia Hyperglycemia symptoms Hyperglycemia, also called high blood glucose, occurs when blood glucose is too high. Make sure you know the early signs of hyperglycemia, such as:  Increased thirst.  Hunger.  Feeling very tired.  Needing to urinate more often than usual.  Blurry vision. Hypoglycemia symptoms Hypoglycemia, also called low blood glucose, occurswith a blood glucose level at or below 70 mg/dL (3.9 mmol/L). The risk for hypoglycemia increases  during or after exercise, during sleep, during illness, and when skipping meals or not eating for a long time (fasting). It is important to know the symptoms of hypoglycemia and treat it right away. Always have a 15-gram rapid-acting carbohydrate snack with you to treat low blood glucose. Family members and close friends should also know the symptoms and should understand how to treat hypoglycemia, in case you are not able to treat yourself. Symptoms may include:  Hunger.  Anxiety.  Sweating and feeling clammy.  Confusion.  Dizziness or feeling light-headed.  Sleepiness.  Nausea.  Increased heart rate.  Headache.  Blurry vision.  Irritability.  A change in coordination.  Tingling or numbness around the mouth, lips, or tongue.  Restless sleep.  Fainting.  Seizure. Treating hypoglycemia If you are alert and able to  swallow safely, follow the 15:15 rule:  Take 15 grams of a rapid-acting carbohydrate. Talk with your health care provider about how much you should take.  Rapid-acting options include: ? Glucose pills (take 15 grams). ? 6-8 pieces of hard candy. ? 4-6 oz (120-150 mL) of fruit juice. ? 4-6 oz (120-150 mL) of regular (not diet) soda. ? 1 Tbsp (15 mL) honey or sugar.  Check your blood glucose 15 minutes after you take the carbohydrate.  If the repeat blood glucose level is still at or below 70 mg/dL (3.9 mmol/L), take 15 grams of a carbohydrate again.  If your blood glucose level does not increase above 70 mg/dL (3.9 mmol/L) after 3 tries, seek emergency medical care.  After your blood glucose level returns to normal, eat a meal or a snack within 1 hour. Treating severe hypoglycemia Severe hypoglycemia is when your blood glucose level is at or below 54 mg/dL (3 mmol/L). Severe hypoglycemia is an emergency. Do not wait to see if the symptoms will go away. Get medical help right away. Call your local emergency services (911 in the [place] If you have severe hypoglycemia and you cannot eat or drink, you may need an injection of glucagon. A family member or close friend should learn how to check your blood glucose and how to give you a glucagon injection. Ask your health care provider if you need to have an emergency glucagon injection kit available. Severe hypoglycemia may need to be treated in a hospital. The treatment may include getting glucose through an IV. You may also need treatment for the cause of your hypoglycemia. Follow these instructions at home: Take diabetes medicines as told  If your health care provider prescribed insulin or diabetes medicines, take them every day.  Do not run out of insulin or other diabetes medicines that you take. Plan ahead so you always have these available.  If you use insulin, adjust your dosage based on how physically active you are and what foods you  eat. Your health care provider will tell you how to adjust your dosage. Make healthy food choices  The things that you eat and drink affect your blood glucose and your insulin dosage. Making good choices helps to control your diabetes and prevent other health problems. A healthy meal plan includes eating lean proteins, complex carbohydrates, fresh fruits and vegetables, low-fat dairy products, and healthy fats. Make an appointment to see a diet and nutrition specialist (registered dietitian) to help you create an eating plan that is right for you. Make sure that you:  Follow instructions from your health care provider about eating or drinking restrictions.  Drink enough fluid to keep your urine pale  yellow.  Keep a record of the carbohydrates that you eat. Do this by reading food labels and learning the standard serving sizes of foods.  Follow your sick day plan whenever you cannot eat or drink as usual. Make this plan in advance with your health care provider.  Stay active Exercise regularly, as told by your health care provider. This may include:  Stretching and doing strength exercises, such as yoga or weightlifting, 2 or more times a week.  Doing 150 minutes or more of moderate-intensity or vigorous-intensity exercise each week. This could be brisk walking, biking, or water aerobics. ? Spread out your activity over 3 or more days of the week. ? Do not go more than 2 days in a row without doing some kind of physical activity. When you start a new exercise or activity, work with your health care provider to adjust your insulin, medicines, or food intake as needed. Make healthy lifestyle choices  Do not use any tobacco products, such as cigarettes, chewing tobacco, and e-cigarettes. If you need help quitting, ask your health care provider.  If your health care provider says that alcohol is safe for you, limit alcohol intake to no more than 1 drink per day for nonpregnant women and 2  drinks per day for men. One drink equals 12 oz of beer (355 mL), 5 oz of wine (148 mL), or 1 oz of hard liquor (44 mL).  Learn to manage stress. If you need help with this, ask your health care provider. Care for your body   Keep your immunizations up to date. In addition to getting vaccinations as told by your health care provider, it is recommended that you get vaccinated against the following illnesses: ? The flu (influenza). Get a flu shot every year. ? Pneumonia. ? Hepatitis B.  Schedule an eye exam soon after your diagnosis, and then one time every year after that.  Check your skin and feet every day for cuts, bruises, redness, blisters, or sores. Schedule a foot exam with your health care provider once every year.  Brush your teeth and gums two times a day, and floss one or more times a day. Visit your dentist one or more times every 6 months.  Maintain a healthy weight. General instructions  Take over-the-counter and prescription medicines only as told by your health care provider.  Share your diabetes management plan with people in your workplace, school, and household.  Carry a medical alert card or wear medical alert jewelry.  Keep all follow-up visits as told by your health care provider. This is important. Questions to ask your health care provider  Do I need to meet with a diabetes educator?  Where can I find a support group for people with diabetes? Where to find more information For more information about diabetes, visit:  American Diabetes Association (ADA): www.diabetes.org  American Association of Diabetes Educators (AADE): www.diabeteseducator.org Summary  Caring for yourself after you have been diagnosed with (type 2 diabetes mellitus) means keeping your blood sugar (glucose) under control with a balance of nutrition, exercise, lifestyle changes, and medicine.  Check your blood glucose every day, as often as told by your health care provider.  Having  diabetes can put you at risk for other long-term (chronic) conditions, such as heart disease and kidney disease. Your health care provider may prescribe medicines to help prevent complications from diabetes.  Keep all follow-up visits as told by your health care provider. This is important. This information is not intended  to replace advice given to you by your health care provider. Make sure you discuss any questions you have with your health care provider. Document Revised: 12/01/2017 Document Reviewed: 07/14/2015 Elsevier Patient Education  [employer].   If you have lab work done today you will be contacted with your lab results within the next 2 weeks.  If you have not heard from [place] then please contact us. The fastest way to get your results is to register for My Chart.   IF you received an x-ray today, you will receive an invoice from [facility]. Please contact [facility] at [phone] with questions or concerns regarding your invoice.   IF you received labwork today, you will receive an invoice from [facility]. Please contact [facility] at [phone] with questions or concerns regarding your invoice.   Our billing staff will not be able to assist you with questions regarding bills from these companies.  You will be contacted with the lab results as soon as they are available. The fastest way to get your results is to activate your My Chart account. Instructions are located on the last page of this paperwork. If you have not heard from [place] regarding the results in 2 weeks, please contact this office.         Signed, Merri Ray, MD [facility]

## 2019-11-15 NOTE — Patient Instructions (Addendum)
I do believe the A1c will go down with diet and exercise. We can start low dose metformin for now.   Dennard Nip, MD [facility]  - healthy weight and wellness.  . [phone]  Higher dose of Synthroid for now and can repeat testing including liver testing in 6 weeks.  I did order an ultrasound of the liver as well.  Let me know if there are questions from today's visit.   Type 2 Diabetes Mellitus, Self Care, Adult Caring for yourself after you have been diagnosed with type 2 diabetes (type 2 diabetes mellitus) means keeping your blood sugar (glucose) under control with a balance of:  Nutrition.  Exercise.  Lifestyle changes.  Medicines or insulin, if necessary.  Support from your team of health care providers and others. The following information explains what you need to know to manage your diabetes at home. What are the risks? Having diabetes can put you at risk for other long-term (chronic) conditions, such as heart disease and kidney disease. Your health care provider may prescribe medicines to help prevent complications from diabetes. These medicines may include:  Aspirin.  Medicine to lower cholesterol.  Medicine to control blood pressure. How to monitor blood glucose   Check your blood glucose every day, as often as told by your health care provider.  Have your A1c (hemoglobin A1c) level checked two or more times a year, or as often as told by your health care provider. Your health care provider will set individualized treatment goals for you. Generally, the goal of treatment is to maintain the following blood glucose levels:  Before meals (preprandial): 80-130 mg/dL (4.4-7.2 mmol/L).  After meals (postprandial): below 180 mg/dL (10 mmol/L).  A1c level: less than 7%. How to manage hyperglycemia and hypoglycemia Hyperglycemia symptoms Hyperglycemia, also called high blood glucose, occurs when blood glucose is too high. Make sure you know the  early signs of hyperglycemia, such as:  Increased thirst.  Hunger.  Feeling very tired.  Needing to urinate more often than usual.  Blurry vision. Hypoglycemia symptoms Hypoglycemia, also called low blood glucose, occurswith a blood glucose level at or below 70 mg/dL (3.9 mmol/L). The risk for hypoglycemia increases during or after exercise, during sleep, during illness, and when skipping meals or not eating for a long time (fasting). It is important to know the symptoms of hypoglycemia and treat it right away. Always have a 15-gram rapid-acting carbohydrate snack with you to treat low blood glucose. Family members and close friends should also know the symptoms and should understand how to treat hypoglycemia, in case you are not able to treat yourself. Symptoms may include:  Hunger.  Anxiety.  Sweating and feeling clammy.  Confusion.  Dizziness or feeling light-headed.  Sleepiness.  Nausea.  Increased heart rate.  Headache.  Blurry vision.  Irritability.  A change in coordination.  Tingling or numbness around the mouth, lips, or tongue.  Restless sleep.  Fainting.  Seizure. Treating hypoglycemia If you are alert and able to swallow safely, follow the 15:15 rule:  Take 15 grams of a rapid-acting carbohydrate. Talk with your health care provider about how much you should take.  Rapid-acting options include: ? Glucose pills (take 15 grams). ? 6-8 pieces of hard candy. ? 4-6 oz (120-150 mL) of fruit juice. ? 4-6 oz (120-150 mL) of regular (not diet) soda. ? 1 Tbsp (15 mL) honey or sugar.  Check your blood glucose 15 minutes after you take the carbohydrate.  If the repeat  blood glucose level is still at or below 70 mg/dL (3.9 mmol/L), take 15 grams of a carbohydrate again.  If your blood glucose level does not increase above 70 mg/dL (3.9 mmol/L) after 3 tries, seek emergency medical care.  After your blood glucose level returns to normal, eat a meal or a  snack within 1 hour. Treating severe hypoglycemia Severe hypoglycemia is when your blood glucose level is at or below 54 mg/dL (3 mmol/L). Severe hypoglycemia is an emergency. Do not wait to see if the symptoms will go away. Get medical help right away. Call your local emergency services (911 in the [place] If you have severe hypoglycemia and you cannot eat or drink, you may need an injection of glucagon. A family member or close friend should learn how to check your blood glucose and how to give you a glucagon injection. Ask your health care provider if you need to have an emergency glucagon injection kit available. Severe hypoglycemia may need to be treated in a hospital. The treatment may include getting glucose through an IV. You may also need treatment for the cause of your hypoglycemia. Follow these instructions at home: Take diabetes medicines as told  If your health care provider prescribed insulin or diabetes medicines, take them every day.  Do not run out of insulin or other diabetes medicines that you take. Plan ahead so you always have these available.  If you use insulin, adjust your dosage based on how physically active you are and what foods you eat. Your health care provider will tell you how to adjust your dosage. Make healthy food choices  The things that you eat and drink affect your blood glucose and your insulin dosage. Making good choices helps to control your diabetes and prevent other health problems. A healthy meal plan includes eating lean proteins, complex carbohydrates, fresh fruits and vegetables, low-fat dairy products, and healthy fats. Make an appointment to see a diet and nutrition specialist (registered dietitian) to help you create an eating plan that is right for you. Make sure that you:  Follow instructions from your health care provider about eating or drinking restrictions.  Drink enough fluid to keep your urine pale yellow.  Keep a record of the  carbohydrates that you eat. Do this by reading food labels and learning the standard serving sizes of foods.  Follow your sick day plan whenever you cannot eat or drink as usual. Make this plan in advance with your health care provider.  Stay active Exercise regularly, as told by your health care provider. This may include:  Stretching and doing strength exercises, such as yoga or weightlifting, 2 or more times a week.  Doing 150 minutes or more of moderate-intensity or vigorous-intensity exercise each week. This could be brisk walking, biking, or water aerobics. ? Spread out your activity over 3 or more days of the week. ? Do not go more than 2 days in a row without doing some kind of physical activity. When you start a new exercise or activity, work with your health care provider to adjust your insulin, medicines, or food intake as needed. Make healthy lifestyle choices  Do not use any tobacco products, such as cigarettes, chewing tobacco, and e-cigarettes. If you need help quitting, ask your health care provider.  If your health care provider says that alcohol is safe for you, limit alcohol intake to no more than 1 drink per day for nonpregnant women and 2 drinks per day for men. One drink equals  12 oz of beer (355 mL), 5 oz of wine (148 mL), or 1 oz of hard liquor (44 mL).  Learn to manage stress. If you need help with this, ask your health care provider. Care for your body   Keep your immunizations up to date. In addition to getting vaccinations as told by your health care provider, it is recommended that you get vaccinated against the following illnesses: ? The flu (influenza). Get a flu shot every year. ? Pneumonia. ? Hepatitis B.  Schedule an eye exam soon after your diagnosis, and then one time every year after that.  Check your skin and feet every day for cuts, bruises, redness, blisters, or sores. Schedule a foot exam with your health care provider once every year.  Brush  your teeth and gums two times a day, and floss one or more times a day. Visit your dentist one or more times every 6 months.  Maintain a healthy weight. General instructions  Take over-the-counter and prescription medicines only as told by your health care provider.  Share your diabetes management plan with people in your workplace, school, and household.  Carry a medical alert card or wear medical alert jewelry.  Keep all follow-up visits as told by your health care provider. This is important. Questions to ask your health care provider  Do I need to meet with a diabetes educator?  Where can I find a support group for people with diabetes? Where to find more information For more information about diabetes, visit:  American Diabetes Association (ADA): www.diabetes.org  American Association of Diabetes Educators (AADE): www.diabeteseducator.org Summary  Caring for yourself after you have been diagnosed with (type 2 diabetes mellitus) means keeping your blood sugar (glucose) under control with a balance of nutrition, exercise, lifestyle changes, and medicine.  Check your blood glucose every day, as often as told by your health care provider.  Having diabetes can put you at risk for other long-term (chronic) conditions, such as heart disease and kidney disease. Your health care provider may prescribe medicines to help prevent complications from diabetes.  Keep all follow-up visits as told by your health care provider. This is important. This information is not intended to replace advice given to you by your health care provider. Make sure you discuss any questions you have with your health care provider. Document Revised: 12/01/2017 Document Reviewed: 07/14/2015 Elsevier Patient Education  [employer].   If you have lab work done today you will be contacted with your lab results within the next 2 weeks.  If you have not heard from [place] then please contact us. The fastest way to  get your results is to register for My Chart.   IF you received an x-ray today, you will receive an invoice from [facility]. Please contact [facility] at [phone] with questions or concerns regarding your invoice.   IF you received labwork today, you will receive an invoice from [facility]. Please contact [facility] at [phone] with questions or concerns regarding your invoice.   Our billing staff will not be able to assist you with questions regarding bills from these companies.  You will be contacted with the lab results as soon as they are available. The fastest way to get your results is to activate your My Chart account. Instructions are located on the last page of this paperwork. If you have not heard from [place] regarding the results in 2 weeks, please contact this office.

## 2019-11-16 ENCOUNTER — Encounter: Payer: Self-pay | Admitting: Family Medicine

## 2019-11-16 LAB — MICROALBUMIN / CREATININE URINE RATIO
Creatinine, Urine: 190.5 mg/dL
Microalb/Creat Ratio: 23 mg/g creat (ref 0–29)
Microalbumin, Urine: 44.5 ug/mL

## 2019-11-24 ENCOUNTER — Other Ambulatory Visit: Payer: Self-pay

## 2019-11-24 ENCOUNTER — Ambulatory Visit
Admission: RE | Admit: 2019-11-24 | Discharge: 2019-11-24 | Disposition: A | Discharge status: Home / Self Care | Payer: No Typology Code available for payment source | Source: Ambulatory Visit | Attending: Family Medicine | Admitting: Family Medicine

## 2019-11-24 DIAGNOSIS — R7989 Other specified abnormal findings of blood chemistry: Secondary | ICD-10-CM

## 2019-12-22 ENCOUNTER — Ambulatory Visit: Payer: No Typology Code available for payment source

## 2019-12-22 ENCOUNTER — Other Ambulatory Visit: Payer: Self-pay

## 2019-12-22 DIAGNOSIS — E785 Hyperlipidemia, unspecified: Secondary | ICD-10-CM

## 2019-12-22 DIAGNOSIS — E039 Hypothyroidism, unspecified: Secondary | ICD-10-CM

## 2019-12-23 LAB — LIPID PANEL
Chol/HDL Ratio: 3.8 ratio (ref 0.0–4.4)
Cholesterol, Total: 157 mg/dL (ref 100–199)
HDL: 41 mg/dL (ref >39)
LDL Chol Calc (NIH): 85 mg/dL (ref 0–99)
Triglycerides: 182 mg/dL — ABNORMAL HIGH (ref 0–149)
VLDL Cholesterol Cal: 31 mg/dL (ref 5–40)

## 2019-12-23 LAB — HEPATIC FUNCTION PANEL
ALT: 38 IU/L — ABNORMAL HIGH (ref 0–32)
AST: 47 IU/L — ABNORMAL HIGH (ref 0–40)
Albumin: 4.5 g/dL (ref 3.8–4.8)
Alkaline Phosphatase: 79 IU/L (ref 48–121)
Bilirubin Total: 0.5 mg/dL (ref 0.0–1.2)
Bilirubin, Direct: 0.15 mg/dL (ref 0.00–0.40)
Total Protein: 7.3 g/dL (ref 6.0–8.5)

## 2019-12-23 LAB — TSH: TSH: 2.53 u[IU]/mL (ref 0.450–4.500)

## 2019-12-31 ENCOUNTER — Ambulatory Visit (INDEPENDENT_AMBULATORY_CARE_PROVIDER_SITE_OTHER): Payer: No Typology Code available for payment source | Admitting: Family Medicine

## 2019-12-31 ENCOUNTER — Other Ambulatory Visit: Payer: Self-pay

## 2019-12-31 ENCOUNTER — Encounter: Payer: Self-pay | Admitting: Family Medicine

## 2019-12-31 VITALS — BP 123/86 | HR 80 | Temp 98.0°F | Ht 67.0 in | Wt 208.0 lb

## 2019-12-31 DIAGNOSIS — E039 Hypothyroidism, unspecified: Secondary | ICD-10-CM

## 2019-12-31 DIAGNOSIS — E1165 Type 2 diabetes mellitus with hyperglycemia: Secondary | ICD-10-CM

## 2019-12-31 DIAGNOSIS — F418 Other specified anxiety disorders: Secondary | ICD-10-CM

## 2019-12-31 DIAGNOSIS — E785 Hyperlipidemia, unspecified: Secondary | ICD-10-CM

## 2019-12-31 NOTE — Patient Instructions (Addendum)
Keep up the good work with diet, and add in some low intensity exercise most days per week.     Type 2 Diabetes Mellitus, Self Care, Adult Caring for yourself after you have been diagnosed with type 2 diabetes (type 2 diabetes mellitus) means keeping your blood sugar (glucose) under control with a balance of:  Nutrition.  Exercise.  Lifestyle changes.  Medicines or insulin, if necessary.  Support from your team of health care providers and others. The following information explains what you need to know to manage your diabetes at home. What are the risks? Having diabetes can put you at risk for other long-term (chronic) conditions, such as heart disease and kidney disease. Your health care provider may prescribe medicines to help prevent complications from diabetes. These medicines may include:  Aspirin.  Medicine to lower cholesterol.  Medicine to control blood pressure. How to monitor blood glucose   Check your blood glucose every day, as often as told by your health care provider.  Have your A1c (hemoglobin A1c) level checked two or more times a year, or as often as told by your health care provider. Your health care provider will set individualized treatment goals for you. Generally, the goal of treatment is to maintain the following blood glucose levels:  Before meals (preprandial): 80-130 mg/dL (4.4-7.2 mmol/L).  After meals (postprandial): below 180 mg/dL (10 mmol/L).  A1c level: less than 7%. How to manage hyperglycemia and hypoglycemia Hyperglycemia symptoms Hyperglycemia, also called high blood glucose, occurs when blood glucose is too high. Make sure you know the early signs of hyperglycemia, such as:  Increased thirst.  Hunger.  Feeling very tired.  Needing to urinate more often than usual.  Blurry vision. Hypoglycemia symptoms Hypoglycemia, also called low blood glucose, occurswith a blood glucose level at or below 70 mg/dL (3.9 mmol/L). The risk  for hypoglycemia increases during or after exercise, during sleep, during illness, and when skipping meals or not eating for a long time (fasting). It is important to know the symptoms of hypoglycemia and treat it right away. Always have a 15-gram rapid-acting carbohydrate snack with you to treat low blood glucose. Family members and close friends should also know the symptoms and should understand how to treat hypoglycemia, in case you are not able to treat yourself. Symptoms may include:  Hunger.  Anxiety.  Sweating and feeling clammy.  Confusion.  Dizziness or feeling light-headed.  Sleepiness.  Nausea.  Increased heart rate.  Headache.  Blurry vision.  Irritability.  A change in coordination.  Tingling or numbness around the mouth, lips, or tongue.  Restless sleep.  Fainting.  Seizure. Treating hypoglycemia If you are alert and able to swallow safely, follow the 15:15 rule:  Take 15 grams of a rapid-acting carbohydrate. Talk with your health care provider about how much you should take.  Rapid-acting options include: ? Glucose pills (take 15 grams). ? 6-8 pieces of hard candy. ? 4-6 oz (120-150 mL) of fruit juice. ? 4-6 oz (120-150 mL) of regular (not diet) soda. ? 1 Tbsp (15 mL) honey or sugar.  Check your blood glucose 15 minutes after you take the carbohydrate.  If the repeat blood glucose level is still at or below 70 mg/dL (3.9 mmol/L), take 15 grams of a carbohydrate again.  If your blood glucose level does not increase above 70 mg/dL (3.9 mmol/L) after 3 tries, seek emergency medical care.  After your blood glucose level returns to normal, eat a meal or a snack within 1  hour. Treating severe hypoglycemia Severe hypoglycemia is when your blood glucose level is at or below 54 mg/dL (3 mmol/L). Severe hypoglycemia is an emergency. Do not wait to see if the symptoms will go away. Get medical help right away. Call your local emergency services (911 in the  [place] If you have severe hypoglycemia and you cannot eat or drink, you may need an injection of glucagon. A family member or close friend should learn how to check your blood glucose and how to give you a glucagon injection. Ask your health care provider if you need to have an emergency glucagon injection kit available. Severe hypoglycemia may need to be treated in a hospital. The treatment may include getting glucose through an IV. You may also need treatment for the cause of your hypoglycemia. Follow these instructions at home: Take diabetes medicines as told  If your health care provider prescribed insulin or diabetes medicines, take them every day.  Do not run out of insulin or other diabetes medicines that you take. Plan ahead so you always have these available.  If you use insulin, adjust your dosage based on how physically active you are and what foods you eat. Your health care provider will tell you how to adjust your dosage. Make healthy food choices  The things that you eat and drink affect your blood glucose and your insulin dosage. Making good choices helps to control your diabetes and prevent other health problems. A healthy meal plan includes eating lean proteins, complex carbohydrates, fresh fruits and vegetables, low-fat dairy products, and healthy fats. Make an appointment to see a diet and nutrition specialist (registered dietitian) to help you create an eating plan that is right for you. Make sure that you:  Follow instructions from your health care provider about eating or drinking restrictions.  Drink enough fluid to keep your urine pale yellow.  Keep a record of the carbohydrates that you eat. Do this by reading food labels and learning the standard serving sizes of foods.  Follow your sick day plan whenever you cannot eat or drink as usual. Make this plan in advance with your health care provider.  Stay active Exercise regularly, as told by your health care provider.  This may include:  Stretching and doing strength exercises, such as yoga or weightlifting, 2 or more times a week.  Doing 150 minutes or more of moderate-intensity or vigorous-intensity exercise each week. This could be brisk walking, biking, or water aerobics. ? Spread out your activity over 3 or more days of the week. ? Do not go more than 2 days in a row without doing some kind of physical activity. When you start a new exercise or activity, work with your health care provider to adjust your insulin, medicines, or food intake as needed. Make healthy lifestyle choices  Do not use any tobacco products, such as cigarettes, chewing tobacco, and e-cigarettes. If you need help quitting, ask your health care provider.  If your health care provider says that alcohol is safe for you, limit alcohol intake to no more than 1 drink per day for nonpregnant women and 2 drinks per day for men. One drink equals 12 oz of beer (355 mL), 5 oz of wine (148 mL), or 1 oz of hard liquor (44 mL).  Learn to manage stress. If you need help with this, ask your health care provider. Care for your body   Keep your immunizations up to date. In addition to getting vaccinations as told by your  health care provider, it is recommended that you get vaccinated against the following illnesses: ? The flu (influenza). Get a flu shot every year. ? Pneumonia. ? Hepatitis B.  Schedule an eye exam soon after your diagnosis, and then one time every year after that.  Check your skin and feet every day for cuts, bruises, redness, blisters, or sores. Schedule a foot exam with your health care provider once every year.  Brush your teeth and gums two times a day, and floss one or more times a day. Visit your dentist one or more times every 6 months.  Maintain a healthy weight. General instructions  Take over-the-counter and prescription medicines only as told by your health care provider.  Share your diabetes management plan  with people in your workplace, school, and household.  Carry a medical alert card or wear medical alert jewelry.  Keep all follow-up visits as told by your health care provider. This is important. Questions to ask your health care provider  Do I need to meet with a diabetes educator?  Where can I find a support group for people with diabetes? Where to find more information For more information about diabetes, visit:  American Diabetes Association (ADA): www.diabetes.org  American Association of Diabetes Educators (AADE): www.diabeteseducator.org Summary  Caring for yourself after you have been diagnosed with (type 2 diabetes mellitus) means keeping your blood sugar (glucose) under control with a balance of nutrition, exercise, lifestyle changes, and medicine.  Check your blood glucose every day, as often as told by your health care provider.  Having diabetes can put you at risk for other long-term (chronic) conditions, such as heart disease and kidney disease. Your health care provider may prescribe medicines to help prevent complications from diabetes.  Keep all follow-up visits as told by your health care provider. This is important. This information is not intended to replace advice given to you by your health care provider. Make sure you discuss any questions you have with your health care provider. Document Revised: 12/01/2017 Document Reviewed: 07/14/2015 Elsevier Patient Education  [employer].   If you have lab work done today you will be contacted with your lab results within the next 2 weeks.  If you have not heard from [place] then please contact us. The fastest way to get your results is to register for My Chart.   IF you received an x-ray today, you will receive an invoice from [facility]. Please contact [facility] at [phone] with questions or concerns regarding your invoice.   IF you received labwork today, you will receive an invoice from  [facility]. Please contact [facility] at [phone] with questions or concerns regarding your invoice.   Our billing staff will not be able to assist you with questions regarding bills from these companies.  You will be contacted with the lab results as soon as they are available. The fastest way to get your results is to activate your My Chart account. Instructions are located on the last page of this paperwork. If you have not heard from [place] regarding the results in 2 weeks, please contact this office.

## 2019-12-31 NOTE — Progress Notes (Signed)
Subjective:  Patient ID: Brittany Vincent, female    DOB: [birth year]  Age: 64 y.o. MRN: [id number]  CC:  Chief Complaint  Patient presents with  . Follow-up    on diabetes, hyperlipidemia, and hypothyroidisim. pt reports no issues with her chronic conditions. Pt states she takes her medications as prescribed with no known side effects.    HPI Brittany Vincent presents for   Diabetes: Previous prediabetes unfortunately transition to diabetes level with A1c of 7.2 in May.  Started on Metformin 500 mg daily.  She is on statin with Crestor.  No current ACE inhibitor. No diarrhea, or GI intolerance.  No home readings.  Microalbumin: Normal ratio of 23 on 11/15/2019.  Optho, foot exam, pneumovax: Will be scheduling appt with Dr. Katy Fitch. Weight improved since last visit - watching diet - less portions, cut back on sweets, and bread. no new exercise.   Wt Readings from Last 3 Encounters:  12/31/19 208 lb (94.3 kg)  11/15/19 214 lb (97.1 kg)  03/25/19 210 lb 9.6 oz (95.5 kg)   Lab Results  Component Value Date   HGBA1C 7.2 (H) 11/09/2019   HGBA1C 6.4 (H) 03/19/2019   Lab Results  Component Value Date   LDLCALC 85 12/22/2019   CREATININE 0.82 11/09/2019    Hypothyroidism: Lab Results  Component Value Date   TSH 2.530 12/22/2019  Taking medication daily.  Synthroid 137 mcg daily.  No new hot or cold intolerance. No new hair or skin changes, heart palpitations or new fatigue. No new weight changes.  Feels better on current dose.   Hyperlipidemia: Crestor 40 mg daily, no new myalgias/side effects.  Lab Results  Component Value Date   CHOL 157 12/22/2019   HDL 41 12/22/2019   LDLCALC 85 12/22/2019   TRIG 182 (H) 12/22/2019   CHOLHDL 3.8 12/22/2019   Lab Results  Component Value Date   ALT 38 (H) 12/22/2019   AST 47 (H) 12/22/2019   ALKPHOS 79 12/22/2019   BILITOT 0.5 12/22/2019  lft's improved from 88, 54 in 11/09/19.    Situational anxiety: Lexapro 20 mg daily. Working well  at current dose.    HM: Had Dexa from oncologist - Dr. Tacey Heap, 3/29.  t score -2.2, frax 6.8. osteopenia.    History Patient Active Problem List   Diagnosis Date Noted  . History of breast cancer 05/01/2016  . Hypothyroidism 05/01/2016  . Hyperlipidemia 05/01/2016   Past Medical History:  Diagnosis Date  . Breast cancer ([place]) 2013  . Depression   . Hyperlipidemia   . Thyroid disease    Past Surgical History:  Procedure Laterality Date  . BREAST SURGERY    . BUNIONECTOMY Right    No Known Allergies Prior to Admission medications   Medication Sig Start Date End Date Taking? Authorizing Provider  blood glucose meter kit and supplies Dispense based on patient and insurance preference. Use up to four times daily as directed. (FOR ICD-10 E10.9, E11.9). 11/15/19  Yes Wendie Agreste, MD  Calcium Carbonate-Vit D-Min (CALTRATE 600+D PLUS MINERALS) 600-800 MG-UNIT CHEW Chew by mouth.   Yes [provider]  Cholecalciferol (VITAMIN D) 50 MCG (2000 UT) CAPS Take by mouth.   Yes [provider]  escitalopram (LEXAPRO) 20 MG tablet Take 1 tablet (20 mg total) by mouth daily. 11/15/19  Yes Wendie Agreste, MD  letrozole [facility]) 2.5 MG tablet Take 2.5 mg by mouth daily.   Yes [provider]  levothyroxine (SYNTHROID) 137 MCG tablet Take 1  tablet (137 mcg total) by mouth daily before breakfast. 11/15/19  Yes Wendie Agreste, MD  metFORMIN (GLUCOPHAGE) 500 MG tablet Take 1 tablet (500 mg total) by mouth daily with breakfast. 11/15/19  Yes Wendie Agreste, MD  Multiple Vitamins-Minerals (MULTIVITAMIN WITH IRON-MINERALS) liquid Take by mouth daily.   Yes [provider]  rosuvastatin (CRESTOR) 40 MG tablet Take 1 tablet (40 mg total) by mouth daily. 11/15/19  Yes Wendie Agreste, MD   Social History   Socioeconomic History  . Marital status: Married    Spouse name: Not on file  . Number of children: 3  . Years of education: Not on file  .  Highest education level: Not on file  Occupational History  . Occupation: Therapist, sports  Tobacco Use  . Smoking status: Never Smoker  . Smokeless tobacco: Never Used  Vaping Use  . Vaping Use: Never used  Substance and Sexual Activity  . Alcohol use: No  . Drug use: No  . Sexual activity: Not on file  Other Topics Concern  . Not on file  Social History Narrative   Lives with her husband.   Older son lives in [place], [place] with his wife and their 2 children.   Younger Nature conservation officer) son lives in [place], with his wife Lisbeth Renshaw) and their daughter, Ava.   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   Daughter recently moved to [place].   Moved to [place] from [place], [place] to be nearer to [place], [place] and Ava.   Social Determinants of Health   Financial Resource Strain:   . Difficulty of Paying Living Expenses:   Food Insecurity:   . Worried About Charity fundraiser in the Last Year:   . Arboriculturist in the Last Year:   Transportation Needs:   . Film/video editor (Medical):   Marland Kitchen Lack of Transportation (Non-Medical):   Physical Activity:   . Days of Exercise per Week:   . Minutes of Exercise per Session:   Stress:   . Feeling of Stress :   Social Connections:   . Frequency of Communication with Friends and Family:   . Frequency of Social Gatherings with Friends and Family:   . Attends Religious Services:   . Active Member of Clubs or Organizations:   . Attends Archivist Meetings:   Marland Kitchen Marital Status:   Intimate Partner Violence:   . Fear of Current or Ex-Partner:   . Emotionally Abused:   Marland Kitchen Physically Abused:   . Sexually Abused:     Review of Systems  Constitutional: Negative for fatigue and unexpected weight change.  Respiratory: Negative for chest tightness and shortness of breath.   Cardiovascular: Negative for chest pain, palpitations and leg swelling.  Gastrointestinal: Negative for abdominal pain and blood in stool.  Neurological:  Negative for dizziness, syncope, light-headedness and headaches.     Objective:   Vitals:   12/31/19 1525  BP: 123/86  Pulse: 80  Temp: 98 F (36.7 C)  TempSrc: Temporal  SpO2: 96%  Weight: 208 lb (94.3 kg)  Height: 5' 7" (1.702 m)     Physical Exam Vitals reviewed.  Constitutional:      Appearance: She is well-developed.  HENT:     Head: Normocephalic and atraumatic.  Eyes:     Conjunctiva/sclera: Conjunctivae normal.     Pupils: Pupils are equal, round, and reactive to light.  Neck:     Vascular: No carotid bruit.  Cardiovascular:  Rate and Rhythm: Normal rate and regular rhythm.     Heart sounds: Normal heart sounds.  Pulmonary:     Effort: Pulmonary effort is normal.     Breath sounds: Normal breath sounds.  Abdominal:     Palpations: Abdomen is soft. There is no pulsatile mass.     Tenderness: There is no abdominal tenderness.  Skin:    General: Skin is warm and dry.  Neurological:     Mental Status: She is alert and oriented to person, place, and time.  Psychiatric:        Behavior: Behavior normal.        Assessment & Plan:  Brittany Vincent is a 64 y.o. female . Type 2 diabetes mellitus with hyperglycemia, without long-term current use of insulin ([name])  -Tolerating Metformin, repeat labs next few months, continue same for now.  Commended on weight loss with diet changes.  Can add in some increased low intensity activity.  Hypothyroidism, unspecified type  -Stable on recent testing, continue same dose Synthroid.   Hyperlipidemia, unspecified hyperlipidemia type  -Tolerating statin, continue same  Situational anxiety  -Stable with Lexapro, continue same.  No orders of the defined types were placed in this encounter.  Patient Instructions    Keep up the good work with diet, and add in some low intensity exercise most days per week.     Type 2 Diabetes Mellitus, Self Care, Adult Caring for yourself after you have been diagnosed with type 2  diabetes (type 2 diabetes mellitus) means keeping your blood sugar (glucose) under control with a balance of:  Nutrition.  Exercise.  Lifestyle changes.  Medicines or insulin, if necessary.  Support from your team of health care providers and others. The following information explains what you need to know to manage your diabetes at home. What are the risks? Having diabetes can put you at risk for other long-term (chronic) conditions, such as heart disease and kidney disease. Your health care provider may prescribe medicines to help prevent complications from diabetes. These medicines may include:  Aspirin.  Medicine to lower cholesterol.  Medicine to control blood pressure. How to monitor blood glucose   Check your blood glucose every day, as often as told by your health care provider.  Have your A1c (hemoglobin A1c) level checked two or more times a year, or as often as told by your health care provider. Your health care provider will set individualized treatment goals for you. Generally, the goal of treatment is to maintain the following blood glucose levels:  Before meals (preprandial): 80-130 mg/dL (4.4-7.2 mmol/L).  After meals (postprandial): below 180 mg/dL (10 mmol/L).  A1c level: less than 7%. How to manage hyperglycemia and hypoglycemia Hyperglycemia symptoms Hyperglycemia, also called high blood glucose, occurs when blood glucose is too high. Make sure you know the early signs of hyperglycemia, such as:  Increased thirst.  Hunger.  Feeling very tired.  Needing to urinate more often than usual.  Blurry vision. Hypoglycemia symptoms Hypoglycemia, also called low blood glucose, occurswith a blood glucose level at or below 70 mg/dL (3.9 mmol/L). The risk for hypoglycemia increases during or after exercise, during sleep, during illness, and when skipping meals or not eating for a long time (fasting). It is important to know the symptoms of hypoglycemia and  treat it right away. Always have a 15-gram rapid-acting carbohydrate snack with you to treat low blood glucose. Family members and close friends should also know the symptoms and should understand how to treat  hypoglycemia, in case you are not able to treat yourself. Symptoms may include:  Hunger.  Anxiety.  Sweating and feeling clammy.  Confusion.  Dizziness or feeling light-headed.  Sleepiness.  Nausea.  Increased heart rate.  Headache.  Blurry vision.  Irritability.  A change in coordination.  Tingling or numbness around the mouth, lips, or tongue.  Restless sleep.  Fainting.  Seizure. Treating hypoglycemia If you are alert and able to swallow safely, follow the 15:15 rule:  Take 15 grams of a rapid-acting carbohydrate. Talk with your health care provider about how much you should take.  Rapid-acting options include: ? Glucose pills (take 15 grams). ? 6-8 pieces of hard candy. ? 4-6 oz (120-150 mL) of fruit juice. ? 4-6 oz (120-150 mL) of regular (not diet) soda. ? 1 Tbsp (15 mL) honey or sugar.  Check your blood glucose 15 minutes after you take the carbohydrate.  If the repeat blood glucose level is still at or below 70 mg/dL (3.9 mmol/L), take 15 grams of a carbohydrate again.  If your blood glucose level does not increase above 70 mg/dL (3.9 mmol/L) after 3 tries, seek emergency medical care.  After your blood glucose level returns to normal, eat a meal or a snack within 1 hour. Treating severe hypoglycemia Severe hypoglycemia is when your blood glucose level is at or below 54 mg/dL (3 mmol/L). Severe hypoglycemia is an emergency. Do not wait to see if the symptoms will go away. Get medical help right away. Call your local emergency services (911 in the [place] If you have severe hypoglycemia and you cannot eat or drink, you may need an injection of glucagon. A family member or close friend should learn how to check your blood glucose and how to give you a  glucagon injection. Ask your health care provider if you need to have an emergency glucagon injection kit available. Severe hypoglycemia may need to be treated in a hospital. The treatment may include getting glucose through an IV. You may also need treatment for the cause of your hypoglycemia. Follow these instructions at home: Take diabetes medicines as told  If your health care provider prescribed insulin or diabetes medicines, take them every day.  Do not run out of insulin or other diabetes medicines that you take. Plan ahead so you always have these available.  If you use insulin, adjust your dosage based on how physically active you are and what foods you eat. Your health care provider will tell you how to adjust your dosage. Make healthy food choices  The things that you eat and drink affect your blood glucose and your insulin dosage. Making good choices helps to control your diabetes and prevent other health problems. A healthy meal plan includes eating lean proteins, complex carbohydrates, fresh fruits and vegetables, low-fat dairy products, and healthy fats. Make an appointment to see a diet and nutrition specialist (registered dietitian) to help you create an eating plan that is right for you. Make sure that you:  Follow instructions from your health care provider about eating or drinking restrictions.  Drink enough fluid to keep your urine pale yellow.  Keep a record of the carbohydrates that you eat. Do this by reading food labels and learning the standard serving sizes of foods.  Follow your sick day plan whenever you cannot eat or drink as usual. Make this plan in advance with your health care provider.  Stay active Exercise regularly, as told by your health care provider. This may include:  Stretching and doing strength exercises, such as yoga or weightlifting, 2 or more times a week.  Doing 150 minutes or more of moderate-intensity or vigorous-intensity exercise each  week. This could be brisk walking, biking, or water aerobics. ? Spread out your activity over 3 or more days of the week. ? Do not go more than 2 days in a row without doing some kind of physical activity. When you start a new exercise or activity, work with your health care provider to adjust your insulin, medicines, or food intake as needed. Make healthy lifestyle choices  Do not use any tobacco products, such as cigarettes, chewing tobacco, and e-cigarettes. If you need help quitting, ask your health care provider.  If your health care provider says that alcohol is safe for you, limit alcohol intake to no more than 1 drink per day for nonpregnant women and 2 drinks per day for men. One drink equals 12 oz of beer (355 mL), 5 oz of wine (148 mL), or 1 oz of hard liquor (44 mL).  Learn to manage stress. If you need help with this, ask your health care provider. Care for your body   Keep your immunizations up to date. In addition to getting vaccinations as told by your health care provider, it is recommended that you get vaccinated against the following illnesses: ? The flu (influenza). Get a flu shot every year. ? Pneumonia. ? Hepatitis B.  Schedule an eye exam soon after your diagnosis, and then one time every year after that.  Check your skin and feet every day for cuts, bruises, redness, blisters, or sores. Schedule a foot exam with your health care provider once every year.  Brush your teeth and gums two times a day, and floss one or more times a day. Visit your dentist one or more times every 6 months.  Maintain a healthy weight. General instructions  Take over-the-counter and prescription medicines only as told by your health care provider.  Share your diabetes management plan with people in your workplace, school, and household.  Carry a medical alert card or wear medical alert jewelry.  Keep all follow-up visits as told by your health care provider. This is  important. Questions to ask your health care provider  Do I need to meet with a diabetes educator?  Where can I find a support group for people with diabetes? Where to find more information For more information about diabetes, visit:  American Diabetes Association (ADA): www.diabetes.org  American Association of Diabetes Educators (AADE): www.diabeteseducator.org Summary  Caring for yourself after you have been diagnosed with (type 2 diabetes mellitus) means keeping your blood sugar (glucose) under control with a balance of nutrition, exercise, lifestyle changes, and medicine.  Check your blood glucose every day, as often as told by your health care provider.  Having diabetes can put you at risk for other long-term (chronic) conditions, such as heart disease and kidney disease. Your health care provider may prescribe medicines to help prevent complications from diabetes.  Keep all follow-up visits as told by your health care provider. This is important. This information is not intended to replace advice given to you by your health care provider. Make sure you discuss any questions you have with your health care provider. Document Revised: 12/01/2017 Document Reviewed: 07/14/2015 Elsevier Patient Education  [employer].   If you have lab work done today you will be contacted with your lab results within the next 2 weeks.  If you have not heard  from [place] then please contact us. The fastest way to get your results is to register for My Chart.   IF you received an x-ray today, you will receive an invoice from [facility]. Please contact [facility] at [phone] with questions or concerns regarding your invoice.   IF you received labwork today, you will receive an invoice from [place]. Please contact [facility] at [phone] with questions or concerns regarding your invoice.   Our billing staff will not be able to assist you with questions regarding bills from  these companies.  You will be contacted with the lab results as soon as they are available. The fastest way to get your results is to activate your My Chart account. Instructions are located on the last page of this paperwork. If you have not heard from [place] regarding the results in 2 weeks, please contact this office.         Signed, Merri Ray, MD [facility]

## 2020-01-01 ENCOUNTER — Encounter: Payer: Self-pay | Admitting: Family Medicine

## 2020-01-10 LAB — HM MAMMOGRAPHY

## 2020-02-11 ENCOUNTER — Other Ambulatory Visit: Payer: Self-pay | Admitting: Family Medicine

## 2020-02-11 DIAGNOSIS — E1165 Type 2 diabetes mellitus with hyperglycemia: Secondary | ICD-10-CM

## 2020-02-20 ENCOUNTER — Other Ambulatory Visit: Payer: Self-pay | Admitting: Family Medicine

## 2020-02-20 NOTE — Telephone Encounter (Signed)
Requested medication (s) are due for refill today:  yes  Requested medication (s) are on the active medication list: yes   Last refill:  11/23/2019  Future visit scheduled: yes   Notes to clinic:  Medication not assigned to a protocol, review manually   Requested Prescriptions  Pending Prescriptions Disp Refills   letrozole ([place]) 2.5 MG tablet [Pharmacy Med Name: LETROZOLE 2.5 MG TABLET] 90 tablet 1    Sig: TAKE 1 TABLET BY MOUTH EVERY DAY      Off-Protocol Failed - 02/20/2020  8:57 AM      Failed - Medication not assigned to a protocol, review manually.      Passed - Valid encounter within last 12 months    Recent Outpatient Visits           1 month ago Type 2 diabetes mellitus with hyperglycemia, without long-term current use of insulin [facility])   [facility], Ranell Patrick, MD   2 months ago Hypothyroidism, unspecified type   [facility] at [facility]   3 months ago Annual physical exam   Primary [facility] at [place], [place]   3 months ago Hypothyroidism, unspecified type   [facility] at [facility], Lilia Argue, MD   11 months ago Hypothyroidism, unspecified type   [facility] at [facility], Ranell Patrick, MD       Future Appointments             In 1 month Carlota Raspberry Ranell Patrick, MD Primary Care at [place], [facility]

## 2020-02-21 NOTE — Telephone Encounter (Signed)
Patient is requesting a refill of the following medications: Requested Prescriptions   Pending Prescriptions Disp Refills   letrozole ([place]) 2.5 MG tablet [Pharmacy Med Name: LETROZOLE 2.5 MG TABLET] 90 tablet 1    Sig: TAKE 1 TABLET BY MOUTH EVERY DAY    I do not see that you have filled this for pt. Please advise if this is a medication that pt should continue to take?   Sent to provider for approval & guidance.

## 2020-04-12 ENCOUNTER — Encounter: Payer: Self-pay | Admitting: Family Medicine

## 2020-04-12 ENCOUNTER — Other Ambulatory Visit: Payer: Self-pay

## 2020-04-12 ENCOUNTER — Ambulatory Visit (INDEPENDENT_AMBULATORY_CARE_PROVIDER_SITE_OTHER): Payer: No Typology Code available for payment source | Admitting: Family Medicine

## 2020-04-12 VITALS — BP 125/85 | HR 87 | Temp 98.1°F | Ht 67.0 in | Wt 205.6 lb

## 2020-04-12 DIAGNOSIS — R7989 Other specified abnormal findings of blood chemistry: Secondary | ICD-10-CM

## 2020-04-12 DIAGNOSIS — Z23 Encounter for immunization: Secondary | ICD-10-CM

## 2020-04-12 DIAGNOSIS — E1165 Type 2 diabetes mellitus with hyperglycemia: Secondary | ICD-10-CM | POA: Diagnosis not present

## 2020-04-12 NOTE — Patient Instructions (Addendum)
Make sure to eat breakfast/small snack each day. Healthy snack inbetween meals. If any further lows, let me know.  Depending on A1c, can decide on timing of coming off metformin as exercise improves as well.   Thanks for coming in today.    If you have lab work done today you will be contacted with your lab results within the next 2 weeks.  If you have not heard from [place] then please contact us. The fastest way to get your results is to register for My Chart.   IF you received an x-ray today, you will receive an invoice from [facility]. Please contact [facility] at [phone] with questions or concerns regarding your invoice.   IF you received labwork today, you will receive an invoice from [place]. Please contact [facility] at [phone] with questions or concerns regarding your invoice.   Our billing staff will not be able to assist you with questions regarding bills from these companies.  You will be contacted with the lab results as soon as they are available. The fastest way to get your results is to activate your My Chart account. Instructions are located on the last page of this paperwork. If you have not heard from [place] regarding the results in 2 weeks, please contact this office.

## 2020-04-12 NOTE — Progress Notes (Signed)
Subjective:  Patient ID: Brittany Vincent, female    DOB: [birth year]  Age: 64 y.o. MRN: [id number]  CC:  Chief Complaint  Patient presents with  . Follow-up    on diabetes and hyperlipidemia. Pr reports she has been doing well with these conditions. Pt reports checking BS and has been getting readings below 162m/dl. Pr reports no physical symptoms of these conditions.    HPI Brittany Vincent for   Diabetes: Previous prediabetes unfortunately transition to diabetic level with A1c of 7.2 in May.  Started on Metformin 500 mg daily, she is on statin with Crestor.  Microalbumin ratio 23 on May 24. Last visit July 9, weight was improved since that visit, less portions cut back on carbs/bread.   New hours at work - will be off earlier in evening. Has stationary bike and rowing machine.  Home readings: under 120 usuallly. High of 130, low 81 - only one low - diaphoretic, jittery. Did not eat that well that day.  No other episodes.   Wt Readings from Last 3 Encounters:  04/12/20 205 lb 9.6 oz (93.3 kg)  12/31/19 208 lb (94.3 kg)  11/15/19 214 lb (97.1 kg)     Lab Results  Component Value Date   HGBA1C 7.2 (H) 11/09/2019   HGBA1C 6.4 (H) 03/19/2019   Lab Results  Component Value Date   LDLCALC 85 12/22/2019   CREATININE 0.82 11/09/2019   Elevated LFT's. Lab Results  Component Value Date   ALT 38 (H) 12/22/2019   AST 47 (H) 12/22/2019   ALKPHOS 79 12/22/2019   BILITOT 0.5 12/22/2019  Most recent testing improved from AST of 88, ALT 54 in May.  No alcohol.  Ultrasound 11/24/19 - suggested probable hepatic steatosis.   Had covid booster 2 wks ago.  tdap 5 yrs ago.   History Patient Active Problem List   Diagnosis Date Noted  . History of breast cancer 05/01/2016  . Hypothyroidism 05/01/2016  . Hyperlipidemia 05/01/2016   Past Medical History:  Diagnosis Date  . [place]  . Depression   . Hyperlipidemia   . Thyroid disease    Past Surgical History:   Procedure Laterality Date  . BREAST SURGERY    . BUNIONECTOMY Right    No Known Allergies Prior to Admission medications   Medication Sig Start Date End Date Taking? Authorizing Provider  blood glucose meter kit and supplies Dispense based on patient and insurance preference. Use up to four times daily as directed. (FOR ICD-10 E10.9, E11.9). 11/15/19  Yes GWendie Agreste MD  Calcium Carbonate-Vit D-Min (CALTRATE 600+D PLUS MINERALS) 600-800 MG-UNIT CHEW Chew by mouth.   Yes [provider]  Cholecalciferol (VITAMIN D) 50 MCG (2000 UT) CAPS Take by mouth.   Yes [provider]  escitalopram (LEXAPRO) 20 MG tablet Take 1 tablet (20 mg total) by mouth daily. 11/15/19  Yes GWendie Agreste MD  letrozole ([facility] 2.5 MG tablet TAKE 1 TABLET BY MOUTH EVERY DAY 02/22/20  Yes GWendie Agreste MD  levothyroxine (SYNTHROID) 137 MCG tablet Take 1 tablet (137 mcg total) by mouth daily before breakfast. 11/15/19  Yes GWendie Agreste MD  metFORMIN (GLUCOPHAGE) 500 MG tablet TAKE 1 TABLET BY MOUTH EVERY DAY WITH BREAKFAST 02/11/20  Yes GWendie Agreste MD  Multiple Vitamins-Minerals (MULTIVITAMIN WITH IRON-MINERALS) liquid Take by mouth daily.   Yes [provider]  rosuvastatin (CRESTOR) 40 MG tablet Take 1 tablet (40 mg total) by mouth daily. 11/15/19  Yes Wendie Agreste, MD   Social History   Socioeconomic History  . Marital status: Married    Spouse name: Not on file  . Number of children: 3  . Years of education: Not on file  . Highest education level: Not on file  Occupational History  . Occupation: Therapist, sports  Tobacco Use  . Smoking status: Never Smoker  . Smokeless tobacco: Never Used  Vaping Use  . Vaping Use: Never used  Substance and Sexual Activity  . Alcohol use: No  . Drug use: No  . Sexual activity: Not on file  Other Topics Concern  . Not on file  Social History Narrative   Lives with her husband.   Older son lives in [place], [place] with his wife  and their 2 children.   Younger Nature conservation officer) son lives in [place], with his wife Lisbeth Renshaw) and their daughter, Ava.   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   Daughter recently moved to [place].   Moved to [place] from [place], [place] to be nearer to [place], [place] and Ava.   Social Determinants of Health   Financial Resource Strain:   . Difficulty of Paying Living Expenses: Not on file  Food Insecurity:   . Worried About Charity fundraiser in the Last Year: Not on file  . Ran Out of Food in the Last Year: Not on file  Transportation Needs:   . Lack of Transportation (Medical): Not on file  . Lack of Transportation (Non-Medical): Not on file  Physical Activity:   . Days of Exercise per Week: Not on file  . Minutes of Exercise per Session: Not on file  Stress:   . Feeling of Stress : Not on file  Social Connections:   . Frequency of Communication with Friends and Family: Not on file  . Frequency of Social Gatherings with Friends and Family: Not on file  . Attends Religious Services: Not on file  . Active Member of Clubs or Organizations: Not on file  . Attends Archivist Meetings: Not on file  . Marital Status: Not on file  Intimate Partner Violence:   . Fear of Current or Ex-Partner: Not on file  . Emotionally Abused: Not on file  . Physically Abused: Not on file  . Sexually Abused: Not on file    Review of Systems  Per HPI Objective:   Vitals:   04/12/20 1538  BP: 125/85  Pulse: 87  Temp: 98.1 F (36.7 C)  TempSrc: Temporal  SpO2: 95%  Weight: 205 lb 9.6 oz (93.3 kg)  Height: _0  (1.702 m)     Physical Exam Vitals reviewed.  Constitutional:      Appearance: She is well-developed.  HENT:     Head: Normocephalic and atraumatic.  Eyes:     Conjunctiva/sclera: Conjunctivae normal.     Pupils: Pupils are equal, round, and reactive to light.  Neck:     Vascular: No carotid bruit.  Cardiovascular:     Rate and Rhythm: Normal  rate and regular rhythm.     Heart sounds: Normal heart sounds.  Pulmonary:     Effort: Pulmonary effort is normal.     Breath sounds: Normal breath sounds.  Abdominal:     General: There is no distension.     Palpations: Abdomen is soft. There is no mass or pulsatile mass.     Tenderness: There is no abdominal tenderness.  Skin:    General: Skin is warm  and dry.  Neurological:     Mental Status: She is alert and oriented to person, place, and time.  Psychiatric:        Behavior: Behavior normal.     Assessment & Plan:  Brittany Vincent is a 64 y.o. female . Type 2 diabetes mellitus with hyperglycemia, without long-term current use of insulin ([name]) - Plan: Comprehensive metabolic panel, Hemoglobin A1c  -Commended on continued weight loss.  Check A1c.  Hypoglycemia prevention discussed with regular meals, small meals throughout the day as needed.  Recheck in 3 to 6 months depending on A1c.  Consider stopping Metformin as that improves.  Need for vaccination - Plan: Flu Vaccine QUAD 36+ mos IM  Elevated LFTs - Plan: Comprehensive metabolic panel  -Improving on last test.  Anticipate this will improve with weight loss as hepatic steatosis likely noted on ultrasound.  Repeat labs.  No orders of the defined types were placed in this encounter.  Patient Instructions   Make sure to eat breakfast/small snack each day. Healthy snack inbetween meals. If any further lows, let me know.  Depending on A1c, can decide on timing of coming off metformin as exercise improves as well.   Thanks for coming in today.    If you have lab work done today you will be contacted with your lab results within the next 2 weeks.  If you have not heard from [place] then please contact us. The fastest way to get your results is to register for My Chart.   IF you received an x-ray today, you will receive an invoice from [facility]. Please contact [facility] at [phone] with questions or concerns  regarding your invoice.   IF you received labwork today, you will receive an invoice from [facility]. Please contact [facility] at [phone] with questions or concerns regarding your invoice.   Our billing staff will not be able to assist you with questions regarding bills from these companies.  You will be contacted with the lab results as soon as they are available. The fastest way to get your results is to activate your My Chart account. Instructions are located on the last page of this paperwork. If you have not heard from [place] regarding the results in 2 weeks, please contact this office.         Signed, Merri Ray, MD [facility]

## 2020-04-13 LAB — COMPREHENSIVE METABOLIC PANEL
ALT: 35 IU/L — ABNORMAL HIGH (ref 0–32)
AST: 41 IU/L — ABNORMAL HIGH (ref 0–40)
Albumin/Globulin Ratio: 1.4 (ref 1.2–2.2)
Albumin: 4.6 g/dL (ref 3.8–4.8)
Alkaline Phosphatase: 69 IU/L (ref 44–121)
BUN/Creatinine Ratio: 19 (ref 12–28)
BUN: 15 mg/dL (ref 8–27)
Bilirubin Total: 0.3 mg/dL (ref 0.0–1.2)
CO2: 26 mmol/L (ref 20–29)
Calcium: 10.3 mg/dL (ref 8.7–10.3)
Chloride: 104 mmol/L (ref 96–106)
Creatinine, Ser: 0.81 mg/dL (ref 0.57–1.00)
GFR calc Af Amer: 89 mL/min/{1.73_m2} (ref >59)
GFR calc non Af Amer: 77 mL/min/{1.73_m2} (ref >59)
Globulin, Total: 3.2 g/dL (ref 1.5–4.5)
Glucose: 118 mg/dL — ABNORMAL HIGH (ref 65–99)
Potassium: 4.7 mmol/L (ref 3.5–5.2)
Sodium: 143 mmol/L (ref 134–144)
Total Protein: 7.8 g/dL (ref 6.0–8.5)

## 2020-04-13 LAB — HEMOGLOBIN A1C
Est. average glucose Bld gHb Est-mCnc: 137 mg/dL
Hgb A1c MFr Bld: 6.4 % — ABNORMAL HIGH (ref 4.8–5.6)

## 2020-05-01 ENCOUNTER — Other Ambulatory Visit: Payer: Self-pay | Admitting: Family Medicine

## 2020-05-01 DIAGNOSIS — E039 Hypothyroidism, unspecified: Secondary | ICD-10-CM

## 2020-05-30 IMAGING — DX DG LUMBAR SPINE COMPLETE 4+V
5 series · 5 of 5 positions shown · non-contrast
Comparison: None

CLINICAL DATA: Fell out of a chair on 06/10/2018, persistent low
back pain, history breast cancer

EXAM:
LUMBAR SPINE - COMPLETE 4+ VIEW

[l-spine ap]
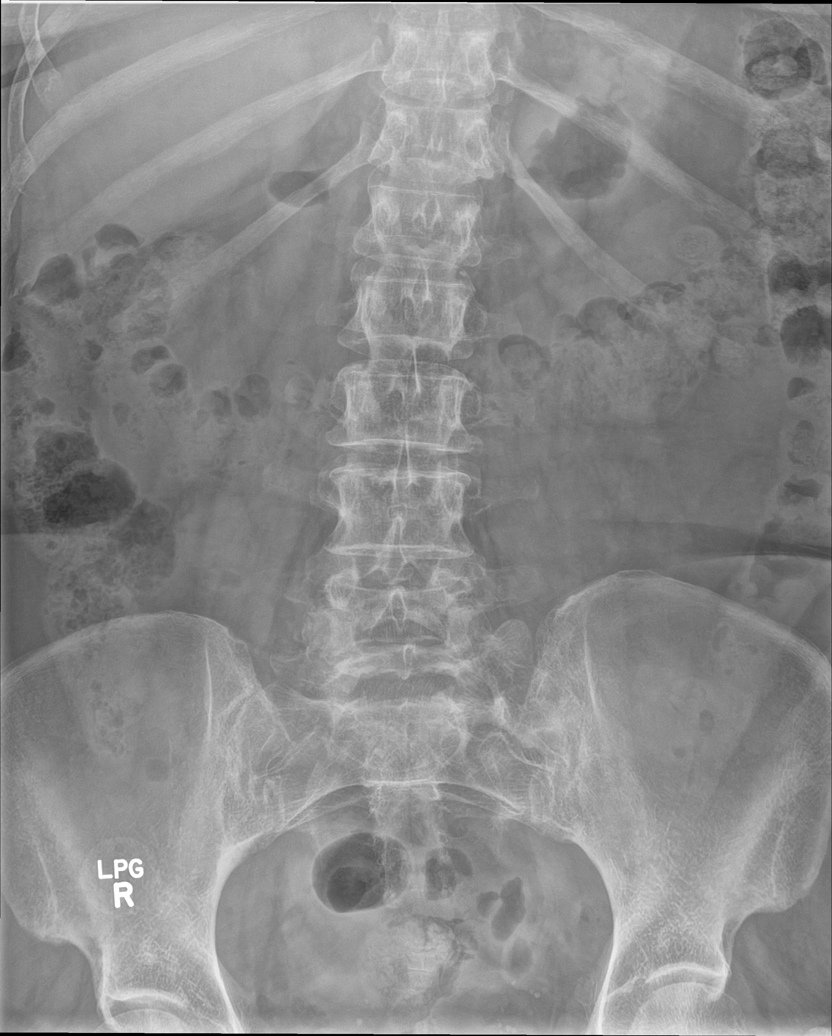

[l-spine obl (1 of 2)]
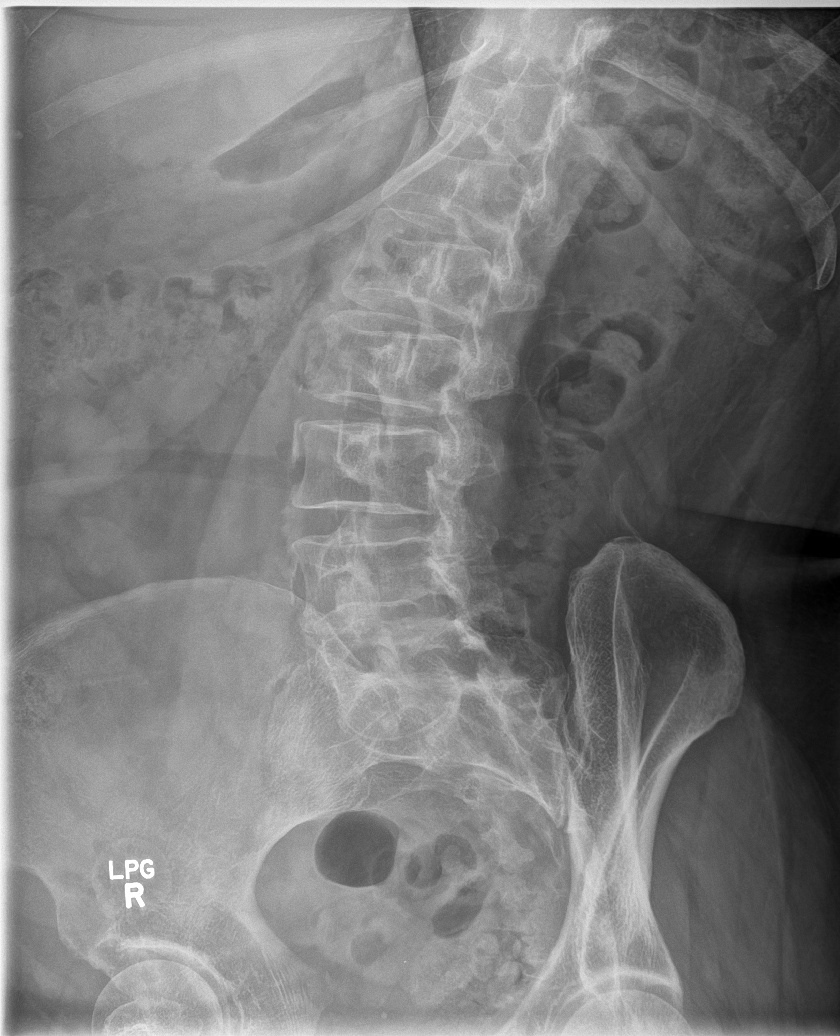

[l-spine obl (2 of 2)]
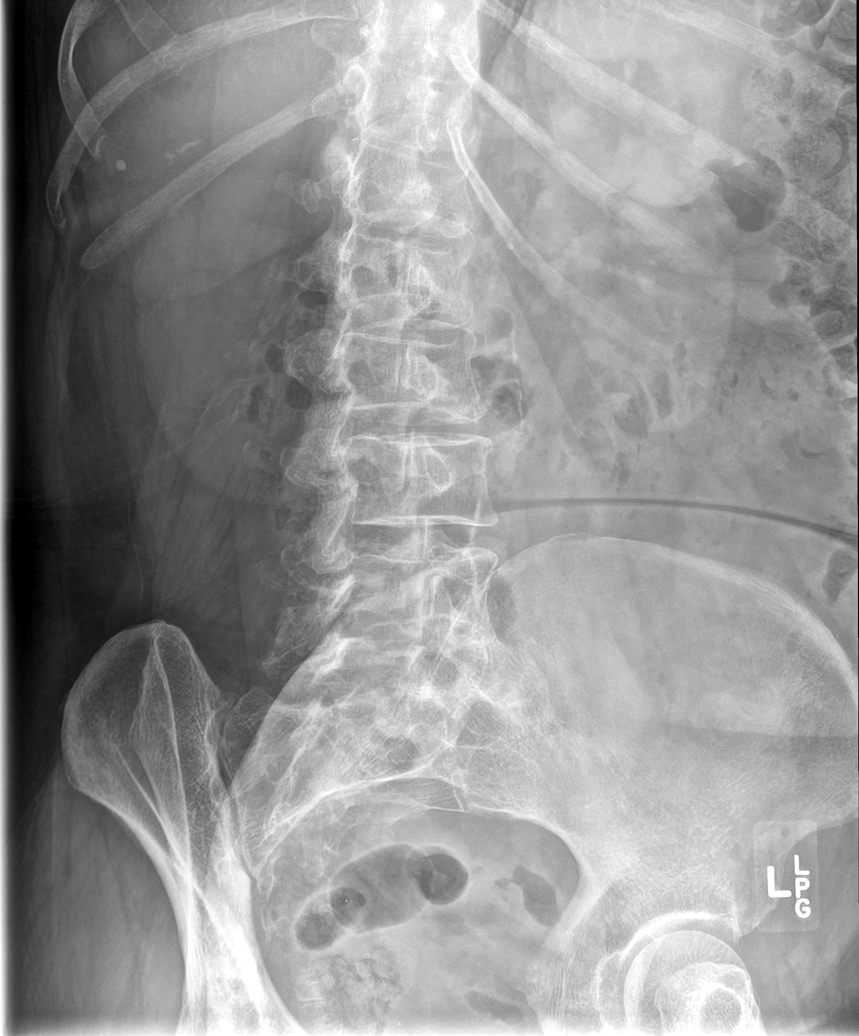

[l-spine lat]
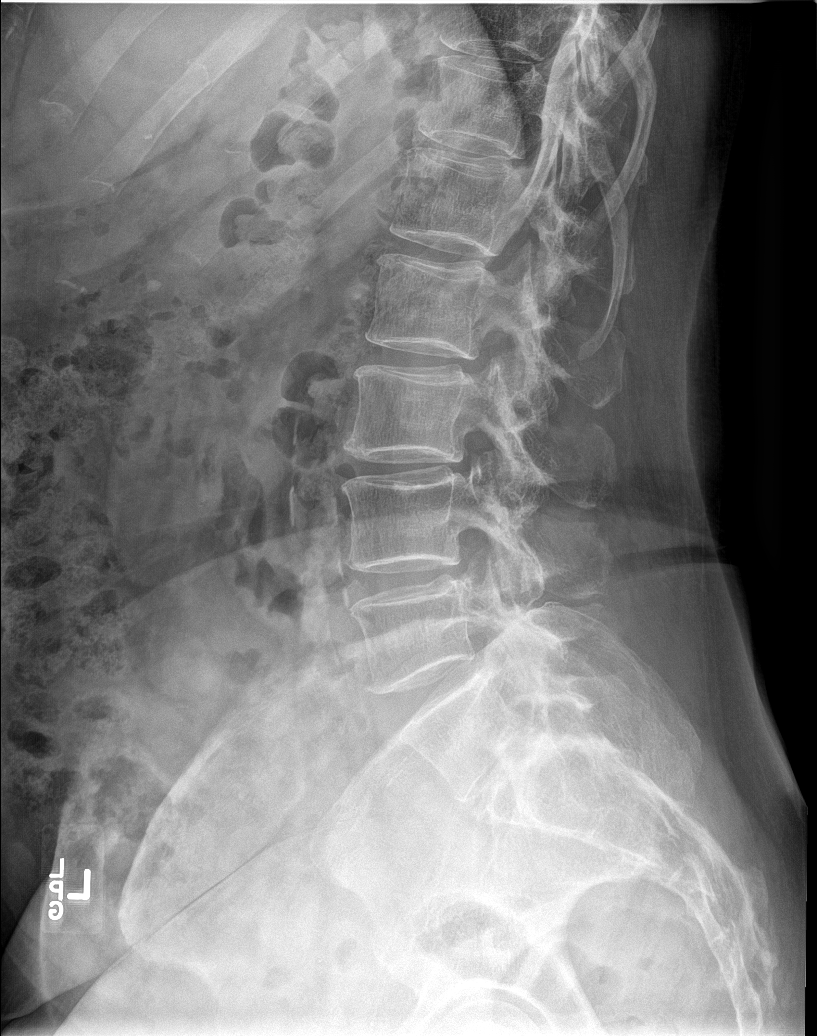

[l-spine spot]
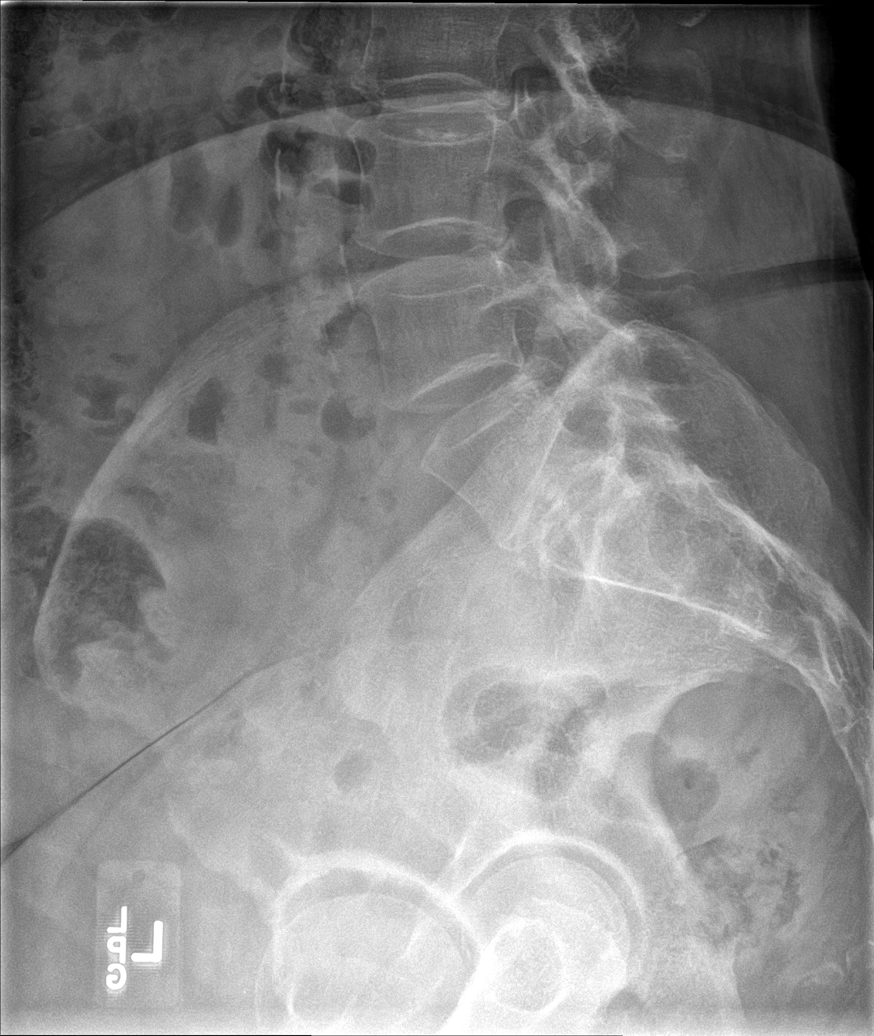

[5 of 5 positions shown; findings below may reference images not displayed]

FINDINGS: Diffuse osseous demineralization.

Six non-rib-bearing lumbar type vertebra.

Vertebral body and disc space heights maintained.

No fracture, subluxation, bone destruction, or spondylolysis.

SI joints preserved.

Atherosclerotic calcifications aorta.
IMPRESSION: No acute osseous abnormalities.

## 2020-08-04 ENCOUNTER — Other Ambulatory Visit: Payer: Self-pay | Admitting: Family Medicine

## 2020-08-04 NOTE — Telephone Encounter (Signed)
Patient is requesting a refill of the following medications: Requested Prescriptions   Pending Prescriptions Disp Refills  . letrozole (FEMARA) 2.5 MG tablet [Pharmacy Med Name: LETROZOLE 2.5 MG TABLET] 90 tablet 1    Sig: TAKE 1 TABLET BY MOUTH EVERY DAY    Date of patient request: 08/04/20 Last office visit: 04/12/20 Date of last refill: 02/22/20 Last refill amount: 90 + 1 Follow up time period per chart: None scheduled

## 2020-08-04 NOTE — Telephone Encounter (Signed)
   Notes to clinic This med has no protocol, please assess.

## 2020-10-05 ENCOUNTER — Encounter: Payer: Self-pay | Admitting: Family Medicine

## 2020-10-11 ENCOUNTER — Ambulatory Visit: Payer: Self-pay | Admitting: Family Medicine

## 2020-10-24 ENCOUNTER — Telehealth: Payer: Self-pay

## 2020-10-24 ENCOUNTER — Emergency Department (HOSPITAL_COMMUNITY)
Admission: EM | Admit: 2020-10-24 | Discharge: 2020-10-24 | Disposition: A | Discharge status: Home / Self Care | Payer: No Typology Code available for payment source | Attending: Emergency Medicine | Admitting: Emergency Medicine

## 2020-10-24 ENCOUNTER — Other Ambulatory Visit: Payer: Self-pay

## 2020-10-24 DIAGNOSIS — R030 Elevated blood-pressure reading, without diagnosis of hypertension: Secondary | ICD-10-CM | POA: Diagnosis not present

## 2020-10-24 DIAGNOSIS — Z7984 Long term (current) use of oral hypoglycemic drugs: Secondary | ICD-10-CM | POA: Insufficient documentation

## 2020-10-24 DIAGNOSIS — Z79899 Other long term (current) drug therapy: Secondary | ICD-10-CM | POA: Insufficient documentation

## 2020-10-24 DIAGNOSIS — Z853 Personal history of malignant neoplasm of breast: Secondary | ICD-10-CM | POA: Diagnosis not present

## 2020-10-24 DIAGNOSIS — E039 Hypothyroidism, unspecified: Secondary | ICD-10-CM | POA: Diagnosis not present

## 2020-10-24 LAB — CBC WITH DIFFERENTIAL/PLATELET
Abs Immature Granulocytes: 0.01 10*3/uL (ref 0.00–0.07)
Basophils Absolute: 0 10*3/uL (ref 0.0–0.1)
Basophils Relative: 1 %
Eosinophils Absolute: 0.3 10*3/uL (ref 0.0–0.5)
Eosinophils Relative: 4 %
HCT: 45.6 % (ref 36.0–46.0)
Hemoglobin: 15 g/dL (ref 12.0–15.0)
Immature Granulocytes: 0 %
Lymphocytes Relative: 33 %
Lymphs Abs: 2.4 10*3/uL (ref 0.7–4.0)
MCH: 32.8 pg (ref 26.0–34.0)
MCHC: 32.9 g/dL (ref 30.0–36.0)
MCV: 99.8 fL (ref 80.0–100.0)
Monocytes Absolute: 0.7 10*3/uL (ref 0.1–1.0)
Monocytes Relative: 10 %
Neutro Abs: 3.9 10*3/uL (ref 1.7–7.7)
Neutrophils Relative %: 52 %
Platelets: 289 10*3/uL (ref 150–400)
RBC: 4.57 MIL/uL (ref 3.87–5.11)
RDW: 11.5 % (ref 11.5–15.5)
WBC: 7.4 10*3/uL (ref 4.0–10.5)
nRBC: 0 % (ref 0.0–0.2)

## 2020-10-24 LAB — COMPREHENSIVE METABOLIC PANEL
ALT: 53 U/L — ABNORMAL HIGH (ref 0–44)
AST: 67 U/L — ABNORMAL HIGH (ref 15–41)
Albumin: 4.3 g/dL (ref 3.5–5.0)
Alkaline Phosphatase: 57 U/L (ref 38–126)
Anion gap: 9 (ref 5–15)
BUN: 16 mg/dL (ref 8–23)
CO2: 28 mmol/L (ref 22–32)
Calcium: 10.3 mg/dL (ref 8.9–10.3)
Chloride: 102 mmol/L (ref 98–111)
Creatinine, Ser: 0.77 mg/dL (ref 0.44–1.00)
GFR, Estimated: >60 mL/min (ref >60)
Glucose, Bld: 120 mg/dL — ABNORMAL HIGH (ref 70–99)
Potassium: 4.6 mmol/L (ref 3.5–5.1)
Sodium: 139 mmol/L (ref 135–145)
Total Bilirubin: 0.7 mg/dL (ref 0.3–1.2)
Total Protein: 7.7 g/dL (ref 6.5–8.1)

## 2020-10-24 LAB — TROPONIN I (HIGH SENSITIVITY)
Troponin I (High Sensitivity): 3 ng/L (ref <18)
Troponin I (High Sensitivity): 3 ng/L (ref <18)

## 2020-10-24 LAB — TSH: TSH: 0.832 u[IU]/mL (ref 0.350–4.500)

## 2020-10-24 NOTE — Telephone Encounter (Signed)
Pt called in reporting BP 144/94 with headaches, and palpitations. Pt also reported other sxs light headed. Note all new sxs in the last 2 weeks.  Was advised to go to ED and was admitted at that time.   FYI to Dr Carlota Raspberry. Last OV 04/12/2020 no previous elevated readings  To scheduling if we could make her a hospital follow up once she has been discharged that would be appreciated

## 2020-10-24 NOTE — ED Triage Notes (Signed)
Pt reports sob, HTN, papillations and intermiittent headaches x2 weeks Pt reports does not have an hx of HTN, Pt reports her vit D levels are elevated. Pt denies any pain but can feel some chest flutter.

## 2020-10-24 NOTE — ED Notes (Signed)
Patient verbalized understanding of discharge instructions. Opportunity for questions and answers.  

## 2020-10-24 NOTE — Discharge Instructions (Addendum)
Your blood pressure readings were high in the emergency department.  There was no evidence of endorgan damage.  While you do not require further emergent workup, you do need to be seen by your PCP soon to evaluate this further.

## 2020-10-24 NOTE — Telephone Encounter (Signed)
Nurse Assessment Nurse: Rolin Barry, RN, Levada Dy Date/Time Eilene Ghazi Time): 10/24/2020 1:59:37 PM Confirm and document reason for call. If symptomatic, describe symptoms. ---Caller states she has high blood pressure 144/94, headaches and palpitations. Caller advised that all the sx are new. Advised that over the past two weeks she is having sx. Does the patient have any new or worsening symptoms? ---Yes Will a triage be completed? ---Yes Related visit to physician within the last 2 weeks? ---No Does the PT have any chronic conditions? (i.e. diabetes, asthma, this includes High risk factors for pregnancy, etc.) ---Yes List chronic conditions. ---Diabetes HX of Breast CA , 8 years ago Is this a behavioral health or substance abuse call? ---No Guidelines Guideline Title Affirmed Question Affirmed Notes Nurse Date/Time (Eastern Time) Heart Rate and Heartbeat Questions New or worsened shortness of breath with activity (dyspnea on exertion) Deaton, RN, Levada Dy 10/24/2020 2:01:57 PM Disp. Time Eilene Ghazi Time) Disposition Final User PLEASE NOTE: All timestamps contained within this report are represented as [place] Standard Time. CONFIDENTIALTY NOTICE: This fax transmission is intended only for the addressee. It contains information that is legally privileged, confidential or otherwise protected from use or disclosure. If you are not the intended recipient, you are strictly prohibited from reviewing, disclosing, copying using or disseminating any of this information or taking any action in reliance on or regarding this information. If you have received this fax in error, please notify us immediately by telephone so that we can arrange for its return to [place]. Phone: [phone], Toll-Free: [phone], Fax: [phone] Page: 2 of 2 Call Id: [id number] 10/24/2020 2:07:37 PM Go to ED Now Yes Rolin Barry, RN, Levada Dy Disposition Overriden: Go to ED Now (or PCP triage) Override Reason: Patient's symptoms need a  higher level of care Caller Disagree/Comply Comply Caller Understands Yes PreDisposition Did not know what to do Care Advice Given Per Guideline GO TO ED NOW (OR PCP TRIAGE): CARE ADVICE given per Heart Rate and Heartbeat Questions (Adult) guideline. GO TO ED NOW: * You need to be seen in the Emergency Department. * Go to the ED at ___________ [place] now. Drive carefully. NOTE TO TRIAGER - DRIVING: * Another adult should drive. * Patient should not delay going to the emergency department. ANOTHER ADULT SHOULD DRIVE: * It is better and safer if another adult drives instead of you. CARE ADVICE given per Heart Rate and Heartbeat Questions (Adult) guideline. Referrals [place]

## 2020-10-24 NOTE — ED Provider Notes (Signed)
Emergency Medicine Provider Triage Evaluation Note  Brittany Vincent , a 65 y.o. female  was evaluated in triage.  Pt complains of shortness of breath and palpations. She reports new hypertension. All of this started two weeks ago.  She has been reading online and knows her vitamin D is high and thinks that may be related.   Review of Systems  Positive: Palpations, fatigue, headaches, short of breath with exertion Negative: Leg swelling, no coughing  Physical Exam  BP (!) 149/101 (BP Location: Left Arm)   Pulse 80   Temp 98.2 F (36.8 C) (Oral)   Resp 15   Ht 5\' 7"  (1.702 m)   Wt 96.2 kg   SpO2 97%   BMI 33.20 kg/m  Gen:   Awake, no distress   Resp:  Normal effort  MSK:   Moves extremities without difficulty    Medical Decision Making  Medically screening exam initiated at 5:16 PM.  Appropriate orders placed.  Brittany Vincent was informed that the remainder of the evaluation will be completed by another provider, this initial triage assessment does not replace that evaluation, and the importance of remaining in the ED until their evaluation is complete.     Lorin Glass, PA-C 10/24/20 1722    Charlesetta Shanks, MD 10/25/20 [phone]

## 2020-10-24 NOTE — ED Provider Notes (Signed)
[facility] EMERGENCY DEPARTMENT Provider Note   CSN: [id number] Arrival date & time: 10/24/20  1556     History Chief Complaint  Patient presents with  . Hypertension  . Headache    Brittany Vincent is a 65 y.o. female.  Patient with past medical history notable for breast cancer, hyperlipidemia, depression, and hypothyroidism presents to the emergency department with a chief complaint of elevated blood pressures.  She states that for the past few weeks her blood pressures have been higher than normal.  She states that today her pressures were higher than they have ever been, and she came to the emergency department for evaluation.  She states that she has had some intermittent headaches, but denies headache now.  She reports she has had intermittent palpitations, but denies any palpitations or chest pain now.  She denies any vision changes, slurred speech, numbness, weakness, or tingling.  She does not take anything for blood pressures.  She states that her vitamin D levels have been very high.  She is having these rechecked with her doctor.  The history is provided by the patient. No language interpreter was used.       Past Medical History:  Diagnosis Date  . Breast cancer ([name]) 2013  . Depression   . Hyperlipidemia   . Thyroid disease     Patient Active Problem List   Diagnosis Date Noted  . History of breast cancer 05/01/2016  . Hypothyroidism 05/01/2016  . Hyperlipidemia 05/01/2016    Past Surgical History:  Procedure Laterality Date  . BREAST SURGERY    . BUNIONECTOMY Right      OB History   No obstetric history on file.     Family History  Problem Relation Age of Onset  . Heart disease Mother   . Cancer Father   . Stroke Maternal Grandmother   . Cancer Maternal Grandfather   . Heart disease Paternal Grandmother   . Stroke Paternal Grandfather     Social History   Tobacco Use  . Smoking status: Never Smoker  . Smokeless tobacco:  Never Used  Vaping Use  . Vaping Use: Never used  Substance Use Topics  . Alcohol use: No  . Drug use: No    Home Medications Prior to Admission medications   Medication Sig Start Date End Date Taking? Authorizing Provider  blood glucose meter kit and supplies Dispense based on patient and insurance preference. Use up to four times daily as directed. (FOR ICD-10 E10.9, E11.9). 11/15/19   Greene, Jeffrey R, MD  Calcium Carbonate-Vit D-Min (CALTRATE 600+D PLUS MINERALS) 600-800 MG-UNIT CHEW Chew by mouth.    [provider]  Cholecalciferol (VITAMIN D) 50 MCG (2000 UT) CAPS Take by mouth.    [provider]  escitalopram (LEXAPRO) 20 MG tablet Take 1 tablet (20 mg total) by mouth daily. 11/15/19   Greene, Jeffrey R, MD  letrozole (FEMARA) 2.5 MG tablet TAKE 1 TABLET BY MOUTH EVERY DAY 08/04/20   Greene, Jeffrey R, MD  levothyroxine (SYNTHROID) 137 MCG tablet TAKE 1 TABLET (137 MCG TOTAL) BY MOUTH DAILY BEFORE BREAKFAST. 05/01/20   Greene, Jeffrey R, MD  metFORMIN (GLUCOPHAGE) 500 MG tablet TAKE 1 TABLET BY MOUTH EVERY DAY WITH BREAKFAST 02/11/20   Greene, Jeffrey R, MD  Multiple Vitamins-Minerals (MULTIVITAMIN WITH IRON-MINERALS) liquid Take by mouth daily.    [provider]  rosuvastatin (CRESTOR) 40 MG tablet Take 1 tablet (40 mg total) by mouth daily. 11/15/19   Greene, Jeffrey R,   MD    Allergies    Patient has no known allergies.  Review of Systems   Review of Systems  All other systems reviewed and are negative.   Physical Exam Updated Vital Signs BP (!) 169/92   Pulse 70   Temp 98.2 F (36.8 C) (Oral)   Resp 14   Ht 5' 7" (1.702 m)   Wt 96.2 kg   SpO2 98%   BMI 33.20 kg/m   Physical Exam Vitals and nursing note reviewed.  Constitutional:      General: She is not in acute distress.    Appearance: She is well-developed.  HENT:     Head: Normocephalic and atraumatic.  Eyes:     Conjunctiva/sclera: Conjunctivae normal.  Cardiovascular:      Rate and Rhythm: Normal rate and regular rhythm.     Heart sounds: No murmur heard.   Pulmonary:     Effort: Pulmonary effort is normal. No respiratory distress.     Breath sounds: Normal breath sounds.  Abdominal:     Palpations: Abdomen is soft.     Tenderness: There is no abdominal tenderness.  Musculoskeletal:        General: Normal range of motion.     Cervical back: Neck supple.  Skin:    General: Skin is warm and dry.  Neurological:     Mental Status: She is alert and oriented to person, place, and time.  Psychiatric:        Mood and Affect: Mood normal.        Behavior: Behavior normal.     ED Results / Procedures / Treatments   Labs (all labs ordered are listed, but only abnormal results are displayed) Labs Reviewed  COMPREHENSIVE METABOLIC PANEL - Abnormal; Notable for the following components:      Result Value   Glucose, Bld 120 (*)    AST 67 (*)    ALT 53 (*)    All other components within normal limits  CBC WITH DIFFERENTIAL/PLATELET  TSH  TROPONIN I (HIGH SENSITIVITY)  TROPONIN I (HIGH SENSITIVITY)    EKG None  Radiology No results found.  Procedures Procedures   Medications Ordered in ED Medications - No data to display  ED Course  I have reviewed the triage vital signs and the nursing notes.  Pertinent labs & imaging results that were available during my care of the patient were reviewed by me and considered in my medical decision making (see chart for details).    MDM Rules/Calculators/A&P                          Patient here with elevated blood pressure reading.  She reports that she has had high blood pressures for the past few weeks.  She states the highest reading was today, this prompted her to come to the emergency department.  On my exam, the patient is resting comfortably, she is not in any acute distress.  Blood pressure is 169/92.  She denies having any active headache, chest pain, numbness, weakness, tingling, slurred speech,  vision changes.  Laboratory work-up is reassuring.  No evidence of acute endorgan damage.  LFTs are mildly elevated, but appear to be about baseline for the patient.  Kidney function is normal.  TSH is normal.  Troponins x2 are negative.  At this time, with patient essentially being asymptomatic with elevated blood pressure, I feel that the patient can be safely discharged home with outpatient follow-up.  She does have good follow-up with her PCP.  I have instructed her to call her PCP for further work-up and evaluation.  I have notified her that she may need to start an antihypertensive agent, but will defer to PCP discretion on this. Final Clinical Impression(s) / ED Diagnoses Final diagnoses:  Elevated blood pressure reading    Rx / DC Orders ED Discharge Orders    None       Montine Circle, Hershal Coria 10/24/20 2253    Dorie Rank, MD 10/26/20 1115

## 2020-10-25 ENCOUNTER — Telehealth (INDEPENDENT_AMBULATORY_CARE_PROVIDER_SITE_OTHER): Payer: No Typology Code available for payment source | Admitting: Registered Nurse

## 2020-10-25 ENCOUNTER — Other Ambulatory Visit: Payer: Self-pay

## 2020-10-25 ENCOUNTER — Encounter: Payer: Self-pay | Admitting: Registered Nurse

## 2020-10-25 VITALS — BP 135/82 | HR 87 | Temp 97.8°F | Resp 17 | Ht 67.0 in | Wt 206.8 lb

## 2020-10-25 DIAGNOSIS — R002 Palpitations: Secondary | ICD-10-CM | POA: Diagnosis not present

## 2020-10-25 DIAGNOSIS — R030 Elevated blood-pressure reading, without diagnosis of hypertension: Secondary | ICD-10-CM

## 2020-10-25 DIAGNOSIS — R0681 Apnea, not elsewhere classified: Secondary | ICD-10-CM

## 2020-10-25 MED ORDER — AMLODIPINE BESYLATE 2.5 MG PO TABS: 2.5000 mg | ORAL_TABLET | Freq: Every day | ORAL | 0 refills | Status: DC

## 2020-10-25 NOTE — Patient Instructions (Addendum)
Ms Brittany Vincent to see you today.   Let me know if you have further concerns after our visit. You can reach myself or Dr. Carlota Raspberry on [address].  Your BP is good today, but I think a low dose of amlodipine 2.5mg  daily can make it better. Tightest guidelines recommend keeping bp below 130/80, though most folks still probably say below 140/90 may be fine.  I have referred you to Dr. Golden Hurter, Cardiology. She will take a look at your chart and have you in for a visit to determine if further steps are warranted. I do not think it would be a bad idea to have an echo and stress test, but then again, I'm not a cardiologist - I'll let Dr. Radford Pax decide!  Your symptoms are consistent with sleep apnea. I have referred you to Dr. Brett Fairy at [facility] for work up on this. Most sleep studies have been done at home due to [place]. This may take a few weeks to schedule.   Otherwise, no lab work warranted today - last night's labs looked good.  Dr. Carlota Raspberry and I will stay in the loop on how things go, certainly reach out if you need anything  Thanks,  Rich    If you have lab work done today you will be contacted with your lab results within the next 2 weeks.  If you have not heard from [place] then please contact us. The fastest way to get your results is to register for My Chart.   IF you received an x-ray today, you will receive an invoice from [facility]. Please contact [facility] at [phone] with questions or concerns regarding your invoice.   IF you received labwork today, you will receive an invoice from [facility]. Please contact [facility] at [phone] with questions or concerns regarding your invoice.   Our billing staff will not be able to assist you with questions regarding bills from these companies.  You will be contacted with the lab results as soon as they are available. The fastest way to get your results is to activate your My Chart account. Instructions are  located on the last page of this paperwork. If you have not heard from [place] regarding the results in 2 weeks, please contact this office.

## 2020-10-25 NOTE — Progress Notes (Signed)
Established Patient Office Visit  Subjective:  Patient ID: Brittany Vincent, female    DOB: 1955/10/29  Age: 65 y.o. MRN: [id number]  CC:  Chief Complaint  Patient presents with  . Hospitalization Follow-up    Patient states she has been experiencing some SOB, cough and  elevated BP. Patient had a ekg done and was told it didn't look good from urgent care to follow up with PCP    HPI Brittany Vincent presents for Er visit follow up   Was seen for elevated BP. Has had intermittent headaches on and off for past few months. Occasional "flutters" in chest, unsure if true palpitations  fam hx of heart disease and stroke. She does have hx of thyroid disease but euthyroid per Er labs last night. Hx of breast ca, on femara long term now.   Some hx of shob, ongoing for a few months. She attributes this to deconditioning as she does not exercise consistently. This has not dramatically changed in recent weeks.   No hx of cardiac concerns.   Does note that she snores at night - nonrestorative sleep, frequent witnessed apnea, wakes with headaches, wakes with dry mouth. This is confirmed by her partner. No hx of sleep study.  EKG from er reviewed. RRR. Perhaps some QRS widening in some leads - however nothing clearly diagnostic no acute concerns. No previous study available.   Past Medical History:  Diagnosis Date  . Breast cancer ([place]) 2013  . Depression   . Hyperlipidemia   . Thyroid disease     Past Surgical History:  Procedure Laterality Date  . BREAST SURGERY    . BUNIONECTOMY Right     Family History  Problem Relation Age of Onset  . Heart disease Mother   . Cancer Father   . Stroke Maternal Grandmother   . Cancer Maternal Grandfather   . Heart disease Paternal Grandmother   . Stroke Paternal Grandfather     Social History   Socioeconomic History  . Marital status: Married    Spouse name: Not on file  . Number of children: 3  . Years of education: Not on file  . Highest  education level: Not on file  Occupational History  . Occupation: Therapist, sports  Tobacco Use  . Smoking status: Never Smoker  . Smokeless tobacco: Never Used  Vaping Use  . Vaping Use: Never used  Substance and Sexual Activity  . Alcohol use: No  . Drug use: No  . Sexual activity: Not on file  Other Topics Concern  . Not on file  Social History Narrative   Lives with her husband.   Older son lives in [place], [place] with his wife and their 2 children.   Younger Nature conservation officer) son lives in [place], with his wife Lisbeth Renshaw) and their daughter, Ava.   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   Daughter recently moved to [place].   Moved to [place] from [place], [place] to be nearer to [place], [place] and Ava.   Social Determinants of Health   Financial Resource Strain: Not on file  Food Insecurity: Not on file  Transportation Needs: Not on file  Physical Activity: Not on file  Stress: Not on file  Social Connections: Not on file  Intimate Partner Violence: Not on file    Outpatient Medications Prior to Visit  Medication Sig Dispense Refill  . blood glucose meter kit and supplies Dispense based on patient and insurance preference. Use up to four times daily  as directed. (FOR ICD-10 E10.9, E11.9). 1 each 0  . Calcium Carbonate-Vit D-Min (CALTRATE 600+D PLUS MINERALS) 600-800 MG-UNIT CHEW Chew by mouth.    . Cholecalciferol (VITAMIN D) 50 MCG (2000 UT) CAPS Take by mouth.    . escitalopram (LEXAPRO) 20 MG tablet Take 1 tablet (20 mg total) by mouth daily. 90 tablet 3  . letrozole (FEMARA) 2.5 MG tablet TAKE 1 TABLET BY MOUTH EVERY DAY 90 tablet 1  . levothyroxine (SYNTHROID) 137 MCG tablet TAKE 1 TABLET (137 MCG TOTAL) BY MOUTH DAILY BEFORE BREAKFAST. 90 tablet 1  . metFORMIN (GLUCOPHAGE) 500 MG tablet TAKE 1 TABLET BY MOUTH EVERY DAY WITH BREAKFAST 90 tablet 1  . Multiple Vitamins-Minerals (MULTIVITAMIN WITH IRON-MINERALS) liquid Take by mouth daily.    . rosuvastatin (CRESTOR) 40  MG tablet Take 1 tablet (40 mg total) by mouth daily. 90 tablet 3   No facility-administered medications prior to visit.    No Known Allergies  ROS Review of Systems Per hpi     Objective:    Physical Exam Vitals and nursing note reviewed.  Constitutional:      General: She is not in acute distress.    Appearance: Normal appearance. She is normal weight. She is not ill-appearing, toxic-appearing or diaphoretic.  Cardiovascular:     Rate and Rhythm: Normal rate and regular rhythm.     Heart sounds: Normal heart sounds. No murmur heard. No friction rub. No gallop.   Pulmonary:     Effort: Pulmonary effort is normal. No respiratory distress.     Breath sounds: Normal breath sounds. No stridor. No wheezing, rhonchi or rales.  Chest:     Chest wall: No tenderness.  Skin:    General: Skin is warm and dry.  Neurological:     General: No focal deficit present.     Mental Status: She is alert and oriented to person, place, and time. Mental status is at baseline.  Psychiatric:        Mood and Affect: Mood normal.        Behavior: Behavior normal.        Thought Content: Thought content normal.        Judgment: Judgment normal.     BP 135/82 Comment: per patient  Pulse 87   Temp 97.8 F (36.6 C) (Temporal)   Resp 17   Ht _0  (1.702 m)   Wt 206 lb 12.8 oz (93.8 kg)   SpO2 100%   BMI 32.39 kg/m  Wt Readings from Last 3 Encounters:  10/25/20 206 lb 12.8 oz (93.8 kg)  10/24/20 212 lb (96.2 kg)  04/12/20 205 lb 9.6 oz (93.3 kg)     Health Maintenance Due  Topic Date Due  . URINE MICROALBUMIN  11/14/2020    There are no preventive care reminders to display for this patient.  Lab Results  Component Value Date   TSH 0.832 10/24/2020   Lab Results  Component Value Date   WBC 7.4 10/24/2020   HGB 15.0 10/24/2020   HCT 45.6 10/24/2020   MCV 99.8 10/24/2020   PLT 289 10/24/2020   Lab Results  Component Value Date   NA 139 10/24/2020   K 4.6 10/24/2020   CO2  28 10/24/2020   GLUCOSE 120 (H) 10/24/2020   BUN 16 10/24/2020   CREATININE 0.77 10/24/2020   BILITOT 0.7 10/24/2020   ALKPHOS 57 10/24/2020   AST 67 (H) 10/24/2020   ALT 53 (H) 10/24/2020   PROT 7.7 10/24/2020  ALBUMIN 4.3 10/24/2020   CALCIUM 10.3 10/24/2020   ANIONGAP 9 10/24/2020   Lab Results  Component Value Date   CHOL 157 12/22/2019   Lab Results  Component Value Date   HDL 41 12/22/2019   Lab Results  Component Value Date   LDLCALC 85 12/22/2019   Lab Results  Component Value Date   TRIG 182 (H) 12/22/2019   Lab Results  Component Value Date   CHOLHDL 3.8 12/22/2019   Lab Results  Component Value Date   HGBA1C 6.4 (H) 04/12/2020      Assessment & Plan:   Problem List Items Addressed This Visit   None   Visit Diagnoses    Witnessed episode of apnea    -  Primary   Relevant Orders   Ambulatory referral to Neurology   Palpitations       Relevant Orders   Ambulatory referral to Cardiology   Elevated BP without diagnosis of hypertension       Relevant Medications   amLODipine (NORVASC) 2.5 MG tablet      Meds ordered this encounter  Medications  . amLODipine (NORVASC) 2.5 MG tablet    Sig: Take 1 tablet (2.5 mg total) by mouth daily.    Dispense:  90 tablet    Refill:  0    Order Specific Question:   Supervising Provider    Answer:   Carlota Raspberry, JEFFREY R [[id number]]    Follow-up: No follow-ups on file.   PLAN  Start low dose amlodipine for elevated BP without formal dx of htn. BP borderline today at 135/82, will pursue goal of below 130/80  Concern for long term use of femara and increased risk of heart disease. With some lingering shob and palpitations, pt may benefit from cardiology referral to consult for potential echo, stress test, monitor, etc. Per pt request will refer to Dr. Radford Pax, though pt open to other options should the wait be too long  Refer to Dr. Brett Fairy in neuro for sleep study. Hx consistent with OSA. This is likely affecting  her BP adversely. Controlling OSA would benefit her in a number of regards.  Return if worsening or failing to improve  Patient encouraged to call clinic with any questions, comments, or concerns.  I spent 36 minutes with this patient, more than 50% of which was spent counseling and/or educating.  Maximiano Coss, NP

## 2020-10-30 ENCOUNTER — Telehealth: Payer: Self-pay

## 2020-10-30 ENCOUNTER — Encounter: Payer: Self-pay | Admitting: Registered Nurse

## 2020-10-30 NOTE — Telephone Encounter (Signed)
We received a voicemail in new patient referrals from this patient checking in on her referral for a sleep study. Can you please give her a call and get that scheduled with her? Thanks!

## 2020-11-01 ENCOUNTER — Telehealth: Payer: Self-pay

## 2020-11-01 NOTE — Telephone Encounter (Signed)
Patient has dropped off FMLA forms to be completed.    States that she had a recent appt with Delfino Lovett and that he is aware of FMLA.  Is requesting FMLA to start 10/25/2020.    I have placed form in providers box up front.

## 2020-11-02 ENCOUNTER — Other Ambulatory Visit: Payer: Self-pay | Admitting: Registered Nurse

## 2020-11-02 NOTE — Telephone Encounter (Signed)
Placed these forms in your folder today.

## 2020-11-03 ENCOUNTER — Other Ambulatory Visit: Payer: Self-pay

## 2020-11-03 DIAGNOSIS — R0683 Snoring: Secondary | ICD-10-CM

## 2020-11-03 NOTE — Telephone Encounter (Signed)
Completed  Thanks  Rich

## 2020-11-06 ENCOUNTER — Ambulatory Visit (INDEPENDENT_AMBULATORY_CARE_PROVIDER_SITE_OTHER): Payer: No Typology Code available for payment source

## 2020-11-06 ENCOUNTER — Other Ambulatory Visit: Payer: Self-pay

## 2020-11-06 ENCOUNTER — Ambulatory Visit (INDEPENDENT_AMBULATORY_CARE_PROVIDER_SITE_OTHER): Payer: No Typology Code available for payment source | Admitting: Cardiology

## 2020-11-06 ENCOUNTER — Encounter: Payer: Self-pay | Admitting: Cardiology

## 2020-11-06 VITALS — BP 132/76 | HR 77 | Ht 67.0 in | Wt 210.0 lb

## 2020-11-06 DIAGNOSIS — I4510 Unspecified right bundle-branch block: Secondary | ICD-10-CM | POA: Diagnosis not present

## 2020-11-06 DIAGNOSIS — R002 Palpitations: Secondary | ICD-10-CM

## 2020-11-06 DIAGNOSIS — G4719 Other hypersomnia: Secondary | ICD-10-CM

## 2020-11-06 NOTE — Progress Notes (Unsigned)
Patient enrolled for [employer] to ship a 14 day ZIO XT long term holter monitor to her home. 

## 2020-11-06 NOTE — Progress Notes (Signed)
Cardiology Consult Note    Date:  11/06/2020   ID:  Brittany Vincent, DOB 10-25-1955, MRN [id number]  PCP:  Wendie Agreste, MD  Cardiologist:  Fransico Him, MD   Chief Complaint  Patient presents with  . New Patient (Initial Visit)    Palpitations and snoring with excessive daytime sleepiness    History of Present Illness:  Chistina Vincent is a 65 y.o. female who is being seen today for the evaluation of Palpitations and possible OSA at the request of Maximiano Coss, NP.  She recently went to see her PCP and BP was 144/46mHg and she usually runs around 124/743mg.  She says that recently her DBP has been creeping up into the 80's.  She has checked it some at home and has been in the 150's/80's.  Her husband is with her today and says that she snores a lot at night and feels tired when she gets up in the am.  She does not dream much.  She works from home and takes her lunch hour to nap.  She has on occasion snorted in her sleep and wakes up.  She has not had any episodes of awakening gasping for breath.  She wakes up some at night with headaches and is new for her.  Her TSH was normal and HbA1C was 6.4 in the past year.  She also has noticed palpitations that are sporadic.  She sometimes notices them at night.  She describes it as a flip flop.  She has not worn a heart monitor.  She denies any chest pain.  She has had some pressure in her chest in the past but said that it was work related.  Occasionally she will have some DOE that she thinks is related to deconditioning.  She is newly dx with pre DM recently and has gained weight as well.  She has not had any dizziness or syncope.  SHe has never smoked.  Her mother had heart valve problems and CAD and had a CABG and valve replacement in her 8025's    Past Medical History:  Diagnosis Date  . [place]  . Depression   . Hyperlipidemia   . Thyroid disease     Past Surgical History:  Procedure Laterality Date  . BREAST SURGERY     . BUNIONECTOMY Right     Current Medications: Current Meds  Medication Sig  . amLODipine (NORVASC) 2.5 MG tablet Take 1 tablet (2.5 mg total) by mouth daily.  . blood glucose meter kit and supplies Dispense based on patient and insurance preference. Use up to four times daily as directed. (FOR ICD-10 E10.9, E11.9).  . [place] Kitchenscitalopram (LEXAPRO) 20 MG tablet Take 1 tablet (20 mg total) by mouth daily.  . [place] Kitchenetrozole (FEMARA) 2.5 MG tablet TAKE 1 TABLET BY MOUTH EVERY DAY  . levothyroxine (SYNTHROID) 137 MCG tablet TAKE 1 TABLET (137 MCG TOTAL) BY MOUTH DAILY BEFORE BREAKFAST.  . metFORMIN (GLUCOPHAGE) 500 MG tablet TAKE 1 TABLET BY MOUTH EVERY DAY WITH BREAKFAST  . rosuvastatin (CRESTOR) 40 MG tablet Take 1 tablet (40 mg total) by mouth daily.    Allergies:   No known allergies   Social History   Socioeconomic History  . Marital status: Married    Spouse name: Not on file  . Number of children: 3  . Years of education: Not on file  . Highest education level: Not on file  Occupational History  . Occupation: RNTherapist, sportsTobacco Use  .  Smoking status: Never Smoker  . Smokeless tobacco: Never Used  Vaping Use  . Vaping Use: Never used  Substance and Sexual Activity  . Alcohol use: No  . Drug use: No  . Sexual activity: Not on file  Other Topics Concern  . Not on file  Social History Narrative   Lives with her husband.   Older son lives in [place], [place] with his wife and their 2 children.   Younger Nature conservation officer) son lives in [place], with his wife Brittany Vincent) and their daughter, Ava.   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   Daughter recently moved to [place].   Moved to [place] from [place], [place] to be nearer to [place], [place] and [place].   Social Determinants of Health   Financial Resource Strain: Not on file  Food Insecurity: Not on file  Transportation Needs: Not on file  Physical Activity: Not on file  Stress: Not on file  Social Connections: Not on file      Family History:  The patient's family history includes Cancer in her father and maternal grandfather; Heart disease in her mother and paternal grandmother; Stroke in her maternal grandmother and paternal grandfather.   ROS:   Please see the history of present illness.    ROS All other systems reviewed and are negative.  No flowsheet data found.  PHYSICAL EXAM:   VS:  BP 132/76 (BP Location: Left Arm, Patient Position: Sitting, Cuff Size: Large) Comment: 124/78 REG CUFF SITTING LEFT ARM  Pulse 77   Ht 5' 7" (1.702 m)   Wt 210 lb (95.3 kg)   SpO2 97%   BMI 32.89 kg/m    GEN: Well nourished, well developed, in no acute distress  HEENT: normal  Neck: no JVD, carotid bruits, or masses Cardiac: RRR; no murmurs, rubs, or gallops,no edema.  Intact distal pulses bilaterally.  Respiratory:  clear to auscultation bilaterally, normal work of breathing GI: soft, nontender, nondistended, + BS MS: no deformity or atrophy  Skin: warm and dry, no rash Neuro:  Alert and Oriented x 3, Strength and sensation are intact Psych: euthymic mood, full affect  Wt Readings from Last 3 Encounters:  11/06/20 210 lb (95.3 kg)  10/25/20 206 lb 12.8 oz (93.8 kg)  10/24/20 212 lb (96.2 kg)      Studies/Labs Reviewed:   EKG:  EKG is not ordered today.  The ekg ordered 10/25/2020 demonstrates NSR with RBBB and anterior infarct>>reported by PCP to be new  Recent Labs: 10/24/2020: ALT 53; BUN 16; Creatinine, Ser 0.77; Hemoglobin 15.0; Platelets 289; Potassium 4.6; Sodium 139; TSH 0.832   Lipid Panel    Component Value Date/Time   CHOL 157 12/22/2019 1130   TRIG 182 (H) 12/22/2019 1130   HDL 41 12/22/2019 1130   CHOLHDL 3.8 12/22/2019 1130   LDLCALC 85 12/22/2019 1130   Additional studies/ records that were reviewed today include:  EKG from PCP, labs from PCP  ASSESSMENT:    1. Palpitations   2. Excessive daytime sleepiness   3. RBBB      PLAN:  In order of problems listed  above:  1. Palpitations -these may be due to undx OSA -I think we should be a 2 week ziopatch to rule out PAF  2.  Excessive daytime sleepiness -she has a hx of loud snoring and daytime sleepiness and I suspect that she has sleep apnea -I will get an Tiger home sleep study to evaluate  3.  Abnormal  EKG/RBBB -EKG noted to have RBBB which is reportedly new per PCP -she is asymptomatic except for some DOE that she attributes to deconditioning from sedentary state, weight gain but does have CRFs including fm hx of CAD but at a late age, pre DM and HLD. -I will get a Lexsican myoview to rule out ischemia -Shared Decision Making/Informed Consent The risks [chest pain, shortness of breath, cardiac arrhythmias, dizziness, blood pressure fluctuations, myocardial infarction, stroke/transient ischemic attack, nausea, vomiting, allergic reaction, radiation exposure, metallic taste sensation and life-threatening complications (estimated to be 1 in 10,000)], benefits (risk stratification, diagnosing coronary artery disease, treatment guidance) and alternatives of a nuclear stress test were discussed in detail with Ms. Mccollister and she agrees to proceed. -check 2D echo to assess LVF and valvular disease  Medication Adjustments/Labs and Tests Ordered: Current medicines are reviewed at length with the patient today.  Concerns regarding medicines are outlined above.  Medication changes, Labs and Tests ordered today are listed in the Patient Instructions below.  There are no Patient Instructions on file for this visit.   Signed, Fransico Him, MD  11/06/2020 2:17 PM    [facility] [place], [place], [place]  [address] Phone: [phone]; Fax: [phone]

## 2020-11-06 NOTE — Addendum Note (Signed)
Addended by: Antonieta Iba on: 11/06/2020 03:06 PM   Modules accepted: Orders

## 2020-11-06 NOTE — Addendum Note (Signed)
Addended by: Antonieta Iba on: 11/06/2020 02:24 PM   Modules accepted: Orders

## 2020-11-06 NOTE — Patient Instructions (Addendum)
Medication Instructions:  Your physician recommends that you continue on your current medications as directed. Please refer to the Current Medication list given to you today.  *If you need a refill on your cardiac medications before your next appointment, please call your pharmacy*  Testing/Procedures: Your physician has requested that you have a lexiscan myoview. For further information please visit [email or web address]. Please follow instruction sheet, as given.  Your physician has requested that you have an echocardiogram. Echocardiography is a painless test that uses sound waves to create images of your heart. It provides your doctor with information about the size and shape of your heart and how well your heart's chambers and valves are working. This procedure takes approximately one hour. There are no restrictions for this procedure.  Your physician has recommended that you wear an event monitor. Event monitors are medical devices that record the heart's electrical activity. Doctors most often [place] these monitors to diagnose arrhythmias. Arrhythmias are problems with the speed or rhythm of the heartbeat. The monitor is a small, portable device. You can wear one while you do your normal daily activities. This is usually used to diagnose what is causing palpitations/syncope (passing out).  Your physician has recommended that you have a sleep study. This test records several body functions during sleep, including: brain activity, eye movement, oxygen and carbon dioxide blood levels, heart rate and rhythm, breathing rate and rhythm, the flow of air through your mouth and nose, snoring, body muscle movements, and chest and belly movement.  Follow-Up: At [facility], you and your health needs are our priority.  As part of our continuing mission to provide you with exceptional heart care, we have created designated Provider Care Teams.  These Care Teams include your primary Cardiologist (physician) and  Advanced Practice Providers (APPs -  Physician Assistants and Nurse Practitioners) who all work together to provide you with the care you need, when you need it.  Follow up with Dr. Radford Pax as needed based on results of testing.    Other Instructions: You have been referred to the Healthy Weight and Wellness Program   [place] Instructions   Your physician has requested you wear a ZIO patch monitor for 14 days.  This is a single patch monitor.   [employer] supplies one patch monitor per enrollment. Additional stickers are not available. Please do not apply patch if you will be having a Nuclear Stress Test, Echocardiogram, Cardiac CT, MRI, or Chest Xray during the period you would be wearing the monitor. The patch cannot be worn during these tests. You cannot remove and re-apply the ZIO XT patch monitor.  Your ZIO patch monitor will be sent Fed Ex from [employer] directly to your home address. It may take 3-5 days to receive your monitor after you have been enrolled.  Once you have received your monitor, please review the enclosed instructions. Your monitor has already been registered assigning a specific monitor serial # to you.  Billing and Patient Assistance Program Information   We have supplied [employer] with any of your insurance information on file for billing purposes. [employer] offers a sliding scale Patient Assistance Program for patients that do not have insurance, or whose insurance does not completely cover the cost of the ZIO monitor.   You must apply for the Patient Assistance Program to qualify for this discounted rate.     To apply, please call [employer] at [phone], select option 4, then select option 2, and ask to apply for  Patient Assistance Program.  Theodore Demark will ask your household income, and how many people are in your household.  They will quote your out-of-pocket cost based on that information.  [employer] will also be able to set up a 42-month,  interest-free payment plan if needed.  Applying the monitor   Shave hair from upper left chest.  Hold abrader disc by orange tab. Rub abrader in 40 strokes over the upper left chest as indicated in your monitor instructions.  Clean area with 4 enclosed alcohol pads. Let dry.  Apply patch as indicated in monitor instructions. Patch will be placed under collarbone on left side of chest with arrow pointing upward.  Rub patch adhesive wings for 2 minutes. Remove white label marked "1". Remove the white label marked "2". Rub patch adhesive wings for 2 additional minutes.  While looking in a mirror, press and release button in center of patch. A small green light will flash 3-4 times. This will be your only indicator that the monitor has been turned on. ?  Do not shower for the first 24 hours. You may shower after the first 24 hours.  Press the button if you feel a symptom. You will hear a small click. Record Date, Time and Symptom in the Patient Logbook.  When you are ready to remove the patch, follow instructions on the last 2 pages of the Patient Logbook. Stick patch monitor onto the last page of Patient Logbook.  Place Patient Logbook in the blue and white box.  Use locking tab on box and tape box closed securely.  The blue and white box has prepaid postage on it. Please place it in the mailbox as soon as possible. Your physician should have your test results approximately 7 days after the monitor has been mailed back to [facility].  Call [facility] at [phone] if you have questions regarding your ZIO XT patch monitor. Call them immediately if you see an orange light blinking on your monitor.  If your monitor falls off in less than 4 days, contact our Monitor department at [phone]. ?If your monitor becomes loose or falls off after 4 days call [employer] at [phone] for suggestions on securing your monitor.?

## 2020-11-06 NOTE — Addendum Note (Signed)
Addended by: Rasheena Talmadge R on: 11/06/2020 03:50 PM   Modules accepted: Orders  

## 2020-11-06 NOTE — Addendum Note (Signed)
Addended by: Antonieta Iba on: 11/06/2020 02:25 PM   Modules accepted: Orders

## 2020-11-07 ENCOUNTER — Other Ambulatory Visit: Payer: Self-pay | Admitting: Family Medicine

## 2020-11-07 DIAGNOSIS — F418 Other specified anxiety disorders: Secondary | ICD-10-CM

## 2020-11-07 DIAGNOSIS — E785 Hyperlipidemia, unspecified: Secondary | ICD-10-CM

## 2020-11-08 ENCOUNTER — Telehealth: Payer: Self-pay | Admitting: *Deleted

## 2020-11-08 ENCOUNTER — Encounter (INDEPENDENT_AMBULATORY_CARE_PROVIDER_SITE_OTHER): Payer: No Typology Code available for payment source | Admitting: Cardiology

## 2020-11-08 DIAGNOSIS — G4733 Obstructive sleep apnea (adult) (pediatric): Secondary | ICD-10-CM | POA: Diagnosis not present

## 2020-11-08 DIAGNOSIS — G4734 Idiopathic sleep related nonobstructive alveolar hypoventilation: Secondary | ICD-10-CM | POA: Diagnosis not present

## 2020-11-08 NOTE — Telephone Encounter (Signed)
No PA is required by [facility] for HST. Ok to activate PIN.

## 2020-11-08 NOTE — Telephone Encounter (Signed)
-----   Message from Antonieta Iba, RN sent at 11/03/2020  9:31 AM EDT ----- Brittany Vincent sleep study has been ordered.

## 2020-11-08 NOTE — Telephone Encounter (Signed)
I received the ok no PA was needed for sleep study and ok to call pt with the PIN #. I called and s/w the pt and she has been given the PIN # [id number] and to do her sleep study tonight 11/08/20. Pt is agreeable to plan of care and thanked me for the call.

## 2020-11-08 NOTE — Telephone Encounter (Signed)
-----   Message from Lauralee Evener, [place] sent at 11/08/2020  9:53 AM EDT ----- No PA is required for HST. Ok to activate PIN. ----- Message ----- From: Antonieta Iba, RN Sent: 11/03/2020   9:32 AM EDT To: [facility] Sleep Studies  [place] sleep study has been ordered.

## 2020-11-13 DIAGNOSIS — I4510 Unspecified right bundle-branch block: Secondary | ICD-10-CM | POA: Diagnosis not present

## 2020-11-13 DIAGNOSIS — R002 Palpitations: Secondary | ICD-10-CM

## 2020-11-15 ENCOUNTER — Ambulatory Visit (INDEPENDENT_AMBULATORY_CARE_PROVIDER_SITE_OTHER): Payer: No Typology Code available for payment source | Admitting: Family Medicine

## 2020-11-15 ENCOUNTER — Other Ambulatory Visit: Payer: Self-pay

## 2020-11-15 ENCOUNTER — Encounter: Payer: Self-pay | Admitting: Family Medicine

## 2020-11-15 VITALS — BP 134/78 | HR 76 | Temp 98.2°F | Resp 16 | Ht 67.0 in | Wt 208.2 lb

## 2020-11-15 DIAGNOSIS — E039 Hypothyroidism, unspecified: Secondary | ICD-10-CM

## 2020-11-15 DIAGNOSIS — R030 Elevated blood-pressure reading, without diagnosis of hypertension: Secondary | ICD-10-CM

## 2020-11-15 DIAGNOSIS — F418 Other specified anxiety disorders: Secondary | ICD-10-CM

## 2020-11-15 DIAGNOSIS — R519 Headache, unspecified: Secondary | ICD-10-CM

## 2020-11-15 DIAGNOSIS — E1165 Type 2 diabetes mellitus with hyperglycemia: Secondary | ICD-10-CM | POA: Diagnosis not present

## 2020-11-15 DIAGNOSIS — R7989 Other specified abnormal findings of blood chemistry: Secondary | ICD-10-CM

## 2020-11-15 DIAGNOSIS — R002 Palpitations: Secondary | ICD-10-CM

## 2020-11-15 DIAGNOSIS — E782 Mixed hyperlipidemia: Secondary | ICD-10-CM | POA: Diagnosis not present

## 2020-11-15 LAB — COMPREHENSIVE METABOLIC PANEL
ALT: 35 U/L (ref 0–35)
AST: 45 U/L — ABNORMAL HIGH (ref 0–37)
Albumin: 4.7 g/dL (ref 3.5–5.2)
Alkaline Phosphatase: 60 U/L (ref 39–117)
BUN: 16 mg/dL (ref 6–23)
CO2: 29 mEq/L (ref 19–32)
Calcium: 10.2 mg/dL (ref 8.4–10.5)
Chloride: 101 mEq/L (ref 96–112)
Creatinine, Ser: 0.7 mg/dL (ref 0.40–1.20)
GFR: 91.26 mL/min (ref >60.00)
Glucose, Bld: 120 mg/dL — ABNORMAL HIGH (ref 70–99)
Potassium: 4 mEq/L (ref 3.5–5.1)
Sodium: 139 mEq/L (ref 135–145)
Total Bilirubin: 0.5 mg/dL (ref 0.2–1.2)
Total Protein: 7.9 g/dL (ref 6.0–8.3)

## 2020-11-15 LAB — LIPID PANEL
Cholesterol: 169 mg/dL (ref 0–200)
HDL: 42.6 mg/dL (ref >39.00)
NonHDL: 126
Total CHOL/HDL Ratio: 4
Triglycerides: 271 mg/dL — ABNORMAL HIGH (ref 0.0–149.0)
VLDL: 54.2 mg/dL — ABNORMAL HIGH (ref 0.0–40.0)

## 2020-11-15 LAB — HEMOGLOBIN A1C: Hgb A1c MFr Bld: 7.2 % — ABNORMAL HIGH (ref 4.6–6.5)

## 2020-11-15 LAB — TSH: TSH: 1.33 u[IU]/mL (ref 0.35–4.50)

## 2020-11-15 LAB — LDL CHOLESTEROL, DIRECT: Direct LDL: 100 mg/dL

## 2020-11-15 MED ORDER — METFORMIN HCL 500 MG PO TABS: ORAL_TABLET | ORAL | 1 refills | Status: DC

## 2020-11-15 MED ORDER — LEVOTHYROXINE SODIUM 137 MCG PO TABS: 137.0000 ug | ORAL_TABLET | Freq: Every day | ORAL | 1 refills | Status: DC

## 2020-11-15 NOTE — Progress Notes (Signed)
Subjective:  Patient ID: Brittany Vincent, female    DOB: 08/04/1955  Age: 65 y.o. MRN: [id number]  CC:  Chief Complaint  Patient presents with  . Diabetes    Pt doing okay today, has been trying to work on diet /maintain as she has been losing weight  . Follow-up    Pt saw Maximiano Coss 5/4 for SOB and bp saw cards and is currently wearing a heart monitor as well as waiting to hear results from Sleep study. Pt reports about same in severity/ sxs mostly headache and high BP (upper 130).  Also requesting order for Vitamin D mid June as previous level was too high   . Hyperlipidemia    Pt is due for lab work but is otherwise well recent refill already sent for pt    HPI Brittany Vincent presents for   Diabetes: With hyperglycemia.  Improving A1c in October.  Previously treated with metformin 500 mg daily.  She is on statin with Crestor, microalbumin ratio 23 last May. Recent referral to weight management.  No new side effects with metformin 570m qd.  Home readings low 100's. Highest 138. No sx lows.  Wt Readings from Last 3 Encounters:  11/15/20 208 lb 3.2 oz (94.4 kg)  11/06/20 210 lb (95.3 kg)  10/25/20 206 lb 12.8 oz (93.8 kg)  weight 205 in 04/12/20.  [place]   optho - next month.  foot exam today.  pneumovax:  Immunization History  Administered Date(s) Administered  . Influenza,inj,Quad PF,6+ Mos 03/25/2019, 04/12/2020  . PFIZER(Purple Top)SARS-COV-2 Vaccination 08/04/2019, 08/25/2019, 04/03/2020, 10/01/2020  pneumonia vaccine 10 years ago. Plans on vaccine at age 65   Lab Results  Component Value Date   HGBA1C 6.4 (H) 04/12/2020   HGBA1C 7.2 (H) 11/09/2019   HGBA1C 6.4 (H) 03/19/2019   Lab Results  Component Value Date   LDLCALC 85 12/22/2019   CREATININE 0.77 10/24/2020   Hyperlipidemia: crestor 422mqd. No new myalgias.  Lab Results  Component Value Date   CHOL 157 12/22/2019   HDL 41 12/22/2019   LDLCALC 85 12/22/2019   TRIG 182 (H)  12/22/2019   CHOLHDL 3.8 12/22/2019   Lab Results  Component Value Date   ALT 53 (H) 10/24/2020   AST 67 (H) 10/24/2020   ALKPHOS 57 10/24/2020   BILITOT 0.7 10/24/2020   Hypothyroidism: Lab Results  Component Value Date   TSH 0.832 10/24/2020   Taking medication daily. Synthroid 13720mqd.  No new hot or cold intolerance. No new hair or skin changes, heart palpitations as above. no  new fatigue. No new weight changes.   Anxiety: Doing ok on Lexapro - managed with current dose, episodic irritability only.    Cardiac See previous notes.  ED visit 10/24/2020.  Video visit with my colleague on May 4.  Intermittent headaches.  Palpitations.  Dyspnea exertion for a few months.  Thought to be possible deconditioning.  Snoring with nonrestorative sleep, witnessed apnea, daytime headaches.  Started on amlodipine low-dose, referred to cardiology, and referred to sleep specialist to evaluate for OSA.  Amlodipine increased to 5 mg on May 9. Cardiology eval May 16.  home sleep study (done and waiting on results), heart monitor Zio patch ordered and referred to medical weight management.  Right bundle branch block noted.  And with family history of CAD Lexiscan Myoview ordered.  Echo ordered. Plan on in June.  Min DOE, no new symptoms, no chest pain. Mild HA at  times. No new sx's.  BP 130/70's on norvasc 67m qd. Tolerating this dose.   Elevated vitamin D: Had been taking 4000iu and in other supplements. Stopped all vitamin D for now - 2nd week of April. Recommended recheck in 8 weeks. [facility] CW[facility]att Mage- her oncologist. Repeat planned today.   History Patient Active Problem List   Diagnosis Date Noted  . History of breast cancer 05/01/2016  . Hypothyroidism 05/01/2016  . Hyperlipidemia 05/01/2016   Past Medical History:  Diagnosis Date  . Breast cancer ([place] 2013  . Depression   . Hyperlipidemia   . Thyroid disease    Past Surgical  History:  Procedure Laterality Date  . BREAST SURGERY    . BUNIONECTOMY Right    Allergies  Allergen Reactions  . No Known Allergies    Prior to Admission medications   Medication Sig Start Date End Date Taking? Authorizing Provider  amLODipine (NORVASC) 2.5 MG tablet Take 1 tablet (2.5 mg total) by mouth daily. 10/25/20  Yes MMaximiano Coss NP  blood glucose meter kit and supplies Dispense based on patient and insurance preference. Use up to four times daily as directed. (FOR ICD-10 E10.9, E11.9). 11/15/19  Yes GWendie Agreste MD  escitalopram (LEXAPRO) 20 MG tablet TAKE 1 TABLET BY MOUTH EVERY DAY 11/07/20  Yes GWendie Agreste MD  letrozole ([facility] 2.5 MG tablet TAKE 1 TABLET BY MOUTH EVERY DAY 08/04/20  Yes GWendie Agreste MD  levothyroxine (SYNTHROID) 137 MCG tablet TAKE 1 TABLET (137 MCG TOTAL) BY MOUTH DAILY BEFORE BREAKFAST. 05/01/20  Yes GWendie Agreste MD  metFORMIN (GLUCOPHAGE) 500 MG tablet TAKE 1 TABLET BY MOUTH EVERY DAY WITH BREAKFAST 02/11/20  Yes GWendie Agreste MD  rosuvastatin (CRESTOR) 40 MG tablet TAKE 1 TABLET BY MOUTH EVERY DAY 11/07/20  Yes GWendie Agreste MD   Social History   Socioeconomic History  . Marital status: Married    Spouse name: Not on file  . Number of children: 3  . Years of education: Not on file  . Highest education level: Not on file  Occupational History  . Occupation: RTherapist, sports Tobacco Use  . Smoking status: Never Smoker  . Smokeless tobacco: Never Used  Vaping Use  . Vaping Use: Never used  Substance and Sexual Activity  . Alcohol use: No  . Drug use: No  . Sexual activity: Not on file  Other Topics Concern  . Not on file  Social History Narrative   Lives with her husband.   [place] 2 children.   Younger (Nature conservation officer son [place] wife (Brittany Vincent and their daughter, Ava.   ROttie Glazierare both physician assistants with [facility] [employer]   [place]   [place]   Social Determinants of Health   Financial Resource Strain: Not on file  Food Insecurity: Not on file  Transportation Needs: Not on file  Physical Activity: Not on file  Stress: Not on file  Social Connections: Not on file  Intimate Partner Violence: Not on file    Review of Systems  Constitutional: Negative for fatigue and unexpected weight change.  Respiratory: Negative for chest tightness and shortness of breath (no new sx -as above. ).   Cardiovascular: Negative for chest pain, palpitations (as above -  denies new symptoms. ) and leg swelling.  Gastrointestinal: Negative for abdominal pain and blood in stool.  Neurological: Negative for dizziness, syncope and light-headedness.  episodic HA as above - no recent changes.     Objective:   Vitals:   11/15/20 1339  BP: 134/78  Pulse: 76  Resp: 16  Temp: 98.2 F (36.8 C)  TempSrc: Temporal  SpO2: 96%  Weight: 208 lb 3.2 oz (94.4 kg)  Height: 5' 7"  (1.702 m)     Physical Exam Vitals reviewed.  Constitutional:      Appearance: She is well-developed.  HENT:     Head: Normocephalic and atraumatic.  Eyes:     Conjunctiva/sclera: Conjunctivae normal.     Pupils: Pupils are equal, round, and reactive to light.  Neck:     Vascular: No carotid bruit.     Comments: No thyromegaly/nodule.  Cardiovascular:     Rate and Rhythm: Normal rate and regular rhythm.     Heart sounds: Normal heart sounds.  Pulmonary:     Effort: Pulmonary effort is normal.     Breath sounds: Normal breath sounds.  Abdominal:     Palpations: Abdomen is soft. There is no pulsatile mass.     Tenderness: There is no abdominal tenderness.  Musculoskeletal:     Right lower leg: No edema.     Left lower leg: No edema.  Skin:    General: Skin is warm and dry.  Neurological:     Mental Status: She is alert and oriented to person, place, and time.  Psychiatric:         Behavior: Behavior normal.     Assessment & Plan:  Aidaly Cordner is a 65 y.o. female . Type 2 diabetes mellitus with hyperglycemia, without long-term current use of insulin ([name]) - Plan: metFORMIN (GLUCOPHAGE) 500 MG tablet, Microalbumin / creatinine urine ratio, Comprehensive metabolic panel, Hemoglobin A1c  -Proved control on last A1c, home readings stable.  Check labs.  Plan for Pneumovax at next visit -at age 28.  Elevated BP without diagnosis of hypertension  -Continue follow-up with cardiology as planned, continue same dose amlodipine for now.  Hypothyroidism, unspecified type - Plan: levothyroxine (SYNTHROID) 137 MCG tablet, TSH  -Tolerating current regimen, check TSH.  Refilled same meds.  High serum vitamin D - Plan: VITAMIN D 25 Hydroxy (Vit-D Deficiency, Fractures)  -As above had elevated reading outside of office at other provider.  Unable to see those results at this time.  Has now cut back on vitamin D supplementation.  We will check level today.  Palpitations Intermittent headache  - Work-up as above with cardiology.  Agree with evaluation for sleep apnea.  RTC/ER precautions  Mixed hyperlipidemia - Plan: Lipid panel  -Tolerating statin, continue same, check labs.  Situational anxiety  -Overall stable with current dose of Lexapro, continue same.  Meds ordered this encounter  Medications  . levothyroxine (SYNTHROID) 137 MCG tablet    Sig: Take 1 tablet (137 mcg total) by mouth daily before breakfast.    Dispense:  90 tablet    Refill:  1  . metFORMIN (GLUCOPHAGE) 500 MG tablet    Sig: TAKE 1 TABLET BY MOUTH EVERY DAY WITH BREAKFAST    Dispense:  90 tablet    Refill:  1    DX Code Needed  .   Patient Instructions  No change in meds for now.  Keep follow up with cardiology as planned.          Signed, Dellis Filbert  Carlota Raspberry, MD [facility]

## 2020-11-15 NOTE — Patient Instructions (Addendum)
No change in meds for now.  Keep follow up with cardiology as planned. I will watch for results as well.   Thanks for coming in today and let me know if there are questions.

## 2020-11-16 LAB — MICROALBUMIN / CREATININE URINE RATIO
Creatinine,U: 159.9 mg/dL
Microalb Creat Ratio: 2 mg/g (ref 0.0–30.0)
Microalb, Ur: 3.2 mg/dL — ABNORMAL HIGH (ref 0.0–1.9)

## 2020-11-23 ENCOUNTER — Ambulatory Visit: Payer: No Typology Code available for payment source

## 2020-11-23 DIAGNOSIS — I4510 Unspecified right bundle-branch block: Secondary | ICD-10-CM

## 2020-11-23 DIAGNOSIS — R002 Palpitations: Secondary | ICD-10-CM

## 2020-11-23 DIAGNOSIS — G4719 Other hypersomnia: Secondary | ICD-10-CM

## 2020-11-23 NOTE — Procedures (Signed)
Sleep Study Report Patient Information First Name: Brittany Last Name: Vincent ID: [id number] Birth Date: Age: Gender: Female Insurer: BMI: 32.9 (W=209 lb, H=5' 7'') Sleep Study Information Study Date: Referring Physician Information First Name: Last Name: E-mail: Work Phone: Mobile Phone: Fax: Neck [place].: [place]: Address: Nov 08, 2020 S/H/A Version: [id number] / [id number] / 77 Mobile Phone: PreAuth #: Summary & Diagnosis TEST DESCRIPTION: Home sleep apnea testing was completed using the WatchPat, a Type 1 device, utilizing  peripheral arterial tonometry (PAT), chest movement, actigraphy, pulse oximetry, pulse rate, body position and snore.  AHI was calculated with apnea and hypopnea using valid sleep time as the denominator. RDI includes apneas,  hypopneas, and RERAs. The data acquired and the scoring of sleep and all associated events were performed in  accordance with the recommended standards and specifications as outlined in the AASM Manual for the Scoring of  Sleep and Associated Events 2.2.0 (2015). FINDINGS: 1. Severe Obstructive Sleep Apnea with AHI 35.4/hr.  2. Minimal Central Sleep Apnea with pAHIc 6.5/hr. 3. Oxygen desaturations as low as 83%. 4. Severe snoring was present. O2 sats were < 88% for 11.6 min. 5. Total sleep time was 7 hrs and 2 min. 6. 14.2% of total sleep time was spent in REM sleep.  7. Normal sleep onset latency at 21 min.  8. Normal REM sleep onset latency at 72 min.  9. Total awakenings were 5.   DIAGNOSIS:  Severe Obstructive Sleep Apnea (G47.33) Nocturnal Hypoxemia  RECOMMENDATIONS: 1. Clinical correlation of these findings is necessary. The decision to treat obstructive sleep apnea (OSA) is usually  based on the presence of apnea symptoms or the presence of associated medical conditions such as Hypertension,  Congestive Heart Failure, Atrial Fibrillation or Obesity. The most common symptoms of OSA are snoring, gasping for  breath while sleeping,  daytime sleepiness and fatigue.   2. Initiating apnea therapy is recommended given the presence of symptoms and/or associated conditions.  Recommend proceeding with one of the following:   a. Auto-CPAP therapy with a pressure range of 5-20cm H2O.   b. An oral appliance (OA) that can be obtained from certain dentists with expertise in sleep medicine. These are  primarily of use in non-obese patients with mild and moderate disease.   c. An ENT consultation which may be useful to look for specific causes of obstruction and possible treatment  Options.   d. If patient is intolerant to PAP therapy, consider referral to ENT for evaluation for hypoglossal nerve stimulator.   3. Close follow-up is necessary to ensure success with CPAP or oral appliance therapy for maximum benefit .  4. A follow-up oximetry study on CPAP is recommended to assess the adequacy of therapy and determine the need  for supplemental oxygen or the potential need for Bi-level therapy. An arterial blood gas to determine the adequacy of  baseline ventilation and oxygenation should also be considered.  5. Healthy sleep recommendations include: adequate nightly sleep (normal 7-9 hrs/night), avoidance of caffeine after  noon and alcohol near bedtime, and maintaining a sleep environment that is cool, dark and quiet.  6. Weight loss for overweight patients is recommended. Even modest amounts of weight loss can significantly  improve the severity of sleep apnea.  7. Snoring recommendations include: weight loss where appropriate, side sleeping, and avoidance of alcohol before  Bed.  8. Operation of motor vehicle or dangerous equipment must be avoided when feeling drowsy, excessively sleepy, or  mentally fatigued.  Report prepared by: Signature:  Fransico Him, MD; [facility]; [place], [place] Board of Sleep Medicine  Electronically Signed: Nov 23, 2020

## 2020-11-28 ENCOUNTER — Telehealth (HOSPITAL_COMMUNITY): Payer: Self-pay | Admitting: *Deleted

## 2020-11-28 ENCOUNTER — Telehealth: Payer: Self-pay | Admitting: Cardiology

## 2020-11-28 NOTE — Telephone Encounter (Signed)
Pt is calling in regards to obtaining the results from her most recent sleep study on 11/06/20. Please advise

## 2020-11-28 NOTE — Telephone Encounter (Signed)
Spoke with patient about sleep study results and went over recommendations listed in the note.  Patient is ready to move on to a CPAP machine and feeling better.  Let her know I would share this information with the  nurse that works with Dr. Radford Pax.  Patient is appreciative of call.

## 2020-11-28 NOTE — Telephone Encounter (Signed)
Patient given detailed instructions per Myocardial Perfusion Study Information Sheet for the test on 12/06/20 at 1030. Patient notified to arrive 15 minutes early and that it is imperative to arrive on time for appointment to keep from having the test rescheduled.  If you need to cancel or reschedule your appointment, please call the office within 24 hours of your appointment. . Patient verbalized understanding.Brittany Vincent, Ranae Palms Mychart letter not needed per pt.

## 2020-11-30 ENCOUNTER — Encounter (HOSPITAL_COMMUNITY): Payer: No Typology Code available for payment source

## 2020-12-01 ENCOUNTER — Telehealth: Payer: Self-pay

## 2020-12-01 DIAGNOSIS — R002 Palpitations: Secondary | ICD-10-CM

## 2020-12-01 DIAGNOSIS — I4510 Unspecified right bundle-branch block: Secondary | ICD-10-CM

## 2020-12-01 NOTE — Telephone Encounter (Signed)
-----   Message from Sueanne Margarita, MD sent at 12/01/2020 10:21 AM EDT ----- Regarding: RE: Theophilus Bones OK to order ETT ----- Message ----- From: Sharol Harness Sent: 12/01/2020  10:09 AM EDT To: Sueanne Margarita, MD, Antonieta Iba, RN Subject: Theophilus Bones                                    Good Morning,  The pt's nuc Josem Kaufmann has been denied. I've faxed an appeal. Following are the details:  Based on [employer] Cardiac Imaging Guidelines Section(s): CD 1.4 Stress Testing with  Imaging - Indications, we cannot approve this request. Your records show that you may  have a problem with the blood vessels in your heart. The request cannot be approved  because: You must have one of the following.  -Findings on your current electrocardiogram or ECG (a tracing of your heart's electrical  activity) that could make it hard to determine whether or not your heart is getting enough  blood supply with exercise.  -An inability to walk on a treadmill as much as needed to reach the target heart rate during  an exercise treadmill test or ETT (an ECG and blood pressure check done before, during,  and after walking on a treadmill). This would be due to the need for ambulatory assistance  (such as a wheelchair, cane, or walker), a significant neurologic (nerves and/or nervous  system) issue, or an orthopedic issue.  -A history of an ETT with results that were abnormal but did not appear to be due to  coronary artery disease (a disease to the blood vessels that provide blood to your heart  muscle).  Thank you,  Anisah

## 2020-12-04 ENCOUNTER — Telehealth: Payer: Self-pay

## 2020-12-04 MED ORDER — DILTIAZEM HCL ER COATED BEADS 180 MG PO CP24: 180.0000 mg | ORAL_CAPSULE | Freq: Every day | ORAL | 3 refills | Status: DC

## 2020-12-04 NOTE — Telephone Encounter (Signed)
-----   Message from Sueanne Margarita, MD sent at 12/03/2020 11:43 AM EDT ----- Heart monitor showed NSR with extra heart beats from the top of the heart called atrial tachycardia and occasional extra heart beats from the bottom of the heart called PVCs. May be driving by her severe OSA that was just dx.   Stop amlodipine and start Cardizem CD 180mg  daily.  CHeck BP and HR daily for a week and call with results. Get 2D echo for PVCs

## 2020-12-04 NOTE — Telephone Encounter (Signed)
The patient has been notified of the result and verbalized understanding.  All questions (if any) were answered. Antonieta Iba, RN 12/04/2020 1:32 PM  Echo is scheduled for 6/15.  Patient will stop amlodipine and start Cardizem CD 180 mg daily. She will check BP for one week and send [place] a message with her readings.

## 2020-12-05 ENCOUNTER — Telehealth: Payer: Self-pay | Admitting: *Deleted

## 2020-12-05 NOTE — Telephone Encounter (Signed)
The patient has been notified of the result and verbalized understanding.  All questions (if any) were answered. Brittany Vincent, [place] 12/05/2020 3:16 PM   Titration sent to sleep pool.

## 2020-12-05 NOTE — Telephone Encounter (Signed)
The patient has been notified of the result and verbalized understanding.  All questions (if any) were answered. Marolyn Hammock, [place] 12/05/2020 3:06 PM   Titration sent to sleep pool

## 2020-12-05 NOTE — Telephone Encounter (Signed)
-----   Message from Sueanne Margarita, MD sent at 11/23/2020  8:52 AM EDT ----- Please let patient know that they have sleep apnea.  Recommend therapeutic CPAP titration for treatment of patient's sleep disordered breathing.  If unable to perform an in lab titration then initiate ResMed auto CPAP from 4 to 15cm H2O with heated humidity and mask of choice and overnight pulse ox on CPAP.

## 2020-12-05 NOTE — Telephone Encounter (Signed)
Patient was returning call 

## 2020-12-05 NOTE — Telephone Encounter (Signed)
Patient was returning call to Gershon Cull. Please see above phone note.

## 2020-12-06 ENCOUNTER — Other Ambulatory Visit: Payer: Self-pay

## 2020-12-06 ENCOUNTER — Ambulatory Visit (HOSPITAL_COMMUNITY): Payer: No Typology Code available for payment source | Attending: Internal Medicine

## 2020-12-06 ENCOUNTER — Other Ambulatory Visit: Payer: No Typology Code available for payment source

## 2020-12-06 ENCOUNTER — Encounter (HOSPITAL_COMMUNITY): Payer: No Typology Code available for payment source

## 2020-12-06 DIAGNOSIS — R002 Palpitations: Secondary | ICD-10-CM | POA: Insufficient documentation

## 2020-12-06 DIAGNOSIS — I4510 Unspecified right bundle-branch block: Secondary | ICD-10-CM | POA: Insufficient documentation

## 2020-12-06 LAB — ECHOCARDIOGRAM COMPLETE
Area-P 1/2: 3.37 cm2
S' Lateral: 3.1 cm

## 2020-12-06 NOTE — Telephone Encounter (Signed)
The patient has been notified of the result and verbalized understanding.  All questions (if any) were answered. Marolyn Hammock, [place] 12/05/2020 3:16 PM   Titration sent to sleep pool.

## 2020-12-06 NOTE — Addendum Note (Signed)
Addended by: Freada Bergeron on: 12/06/2020 03:10 PM   Modules accepted: Orders, Level of Service, SmartSet

## 2020-12-06 NOTE — Telephone Encounter (Signed)
This encounter was created in error - please disregard.

## 2020-12-06 NOTE — Telephone Encounter (Signed)
The patient has been notified of the result and verbalized understanding.  All questions (if any) were answered. Marolyn Hammock, [place] 12/05/2020 3:16 PM   Titration sent to sleep pool.

## 2020-12-06 NOTE — Progress Notes (Signed)
Patient ID: Brittany Vincent, female   DOB: [birth year], 65 y.o.   MRN: [id number]  Echocardiogram 2D Echocardiogram has been performed.  Jennette Dubin 12/06/20

## 2020-12-11 ENCOUNTER — Telehealth: Payer: Self-pay | Admitting: *Deleted

## 2020-12-11 DIAGNOSIS — G4719 Other hypersomnia: Secondary | ICD-10-CM

## 2020-12-11 DIAGNOSIS — G4733 Obstructive sleep apnea (adult) (pediatric): Secondary | ICD-10-CM

## 2020-12-11 DIAGNOSIS — I4510 Unspecified right bundle-branch block: Secondary | ICD-10-CM

## 2020-12-11 DIAGNOSIS — R002 Palpitations: Secondary | ICD-10-CM

## 2020-12-11 NOTE — Telephone Encounter (Signed)
Prior Authorization for CPAP titration sent to [facility] via fax via to [phone]. Pending reference # [id number].

## 2020-12-11 NOTE — Telephone Encounter (Signed)
-----   Message from Freada Bergeron, [place] sent at 12/05/2020  1:54 PM EDT ----- Regarding: precert Recommend therapeutic CPAP titration

## 2020-12-12 MED ORDER — LOSARTAN POTASSIUM 25 MG PO TABS: 25.0000 mg | ORAL_TABLET | Freq: Every day | ORAL | 3 refills | Status: DC

## 2020-12-13 ENCOUNTER — Encounter: Payer: Self-pay | Admitting: Genetic Counselor

## 2020-12-13 ENCOUNTER — Ambulatory Visit (HOSPITAL_BASED_OUTPATIENT_CLINIC_OR_DEPARTMENT_OTHER): Payer: No Typology Code available for payment source | Admitting: Genetic Counselor

## 2020-12-13 ENCOUNTER — Inpatient Hospital Stay: Payer: No Typology Code available for payment source | Attending: Genetic Counselor

## 2020-12-13 DIAGNOSIS — Z800 Family history of malignant neoplasm of digestive organs: Secondary | ICD-10-CM | POA: Insufficient documentation

## 2020-12-13 DIAGNOSIS — Z808 Family history of malignant neoplasm of other organs or systems: Secondary | ICD-10-CM

## 2020-12-13 DIAGNOSIS — Z8049 Family history of malignant neoplasm of other genital organs: Secondary | ICD-10-CM | POA: Insufficient documentation

## 2020-12-13 DIAGNOSIS — C50919 Malignant neoplasm of unspecified site of unspecified female breast: Secondary | ICD-10-CM

## 2020-12-13 DIAGNOSIS — Z8041 Family history of malignant neoplasm of ovary: Secondary | ICD-10-CM

## 2020-12-13 LAB — GENETIC SCREENING ORDER

## 2020-12-13 NOTE — Progress Notes (Signed)
REFERRING PROVIDER: Wendie Agreste, MD [address] [place],  [place] [address]  PRIMARY PROVIDER:  Wendie Agreste, MD  PRIMARY REASON FOR VISIT:  1. Malignant neoplasm of female breast, unspecified estrogen receptor status, unspecified laterality, unspecified site of breast ([place])   2. Family history of malignant neoplasm of digestive organ   3. Family history of malignant neoplasm of ovary   4. Family history of melanoma   5. Family history of uterine cancer      HISTORY OF PRESENT ILLNESS:   Brittany Vincent, a 65 y.o. female, was seen for a [place] cancer genetics consultation at the request of Dr. Carlota Raspberry due to a personal and family history of cancer.  Brittany Vincent presents to clinic today to discuss the possibility of a hereditary predisposition to cancer, genetic testing, and to further clarify her future cancer risks, as well as potential cancer risks for family members.   In 2013, at the age of 56, Brittany Vincent was diagnosed with breast cancer.  She had a bilateral mastectomy and did not need chemotherapy or radiation.  At that time she had genetic testing for BRCA1 and BRCA2 only.  This reportedly was negative.      CANCER HISTORY:  Oncology History   No history exists.     RISK FACTORS:  Menarche was at age 66.  First live birth at age 78.  OCP use for approximately  <1  years.  Ovaries intact: yes.  Hysterectomy: no.  Menopausal status: postmenopausal.  HRT use: 0 years. Colonoscopy: yes; normal. Mammogram within the last year: n/a. Number of breast biopsies: 1. Up to date with pelvic exams: yes. Any excessive radiation exposure in the past: no  Past Medical History:  Diagnosis Date   Breast cancer ([place]) 2013   Depression    Family history of malignant neoplasm of digestive organ    Family history of malignant neoplasm of ovary    Family history of melanoma    Family history of uterine cancer    Hyperlipidemia    Personal history of malignant neoplasm of  breast    Thyroid disease     Past Surgical History:  Procedure Laterality Date   BREAST SURGERY     BUNIONECTOMY Right     Social History   Socioeconomic History   Marital status: Married    Spouse name: Not on file   Number of children: 3   Years of education: Not on file   Highest education level: Not on file  Occupational History   Occupation: RN  Tobacco Use   Smoking status: Never   Smokeless tobacco: Never  Vaping Use   Vaping Use: Never used  Substance and Sexual Activity   Alcohol use: No   Drug use: No   Sexual activity: Not on file  Other Topics Concern   Not on file  Social History Narrative   Lives with her husband.   Older son lives in [place], [place] with his wife and their 2 children.   Younger Nature conservation officer) son lives in [place], with his wife Lisbeth Renshaw) and their daughter, Ava.   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   Daughter recently moved to [place].   Moved to [place] from [place], [place] to be nearer to [place], [place] and A[place].   Social Determinants of Health   Financial Resource Strain: Not on file  Food Insecurity: Not on file  Transportation Needs: Not on file  Physical Activity: Not  on file  Stress: Not on file  Social Connections: Not on file     FAMILY HISTORY:  We obtained a detailed, 4-generation family history.  Significant diagnoses are listed below: Family History  Problem Relation Age of Onset   Heart disease Mother    Cancer Father    Stroke Maternal Grandmother    Cancer Maternal Grandfather    Heart disease Paternal Grandmother    Stroke Paternal Grandfather     The patient has two sons and an adopted daughter who are all cancer free.  She is an only child. Both of her parents are living.  The patient's mother had uterine cancer in her 3's and died at 23 from dementia.  She had two brothers and a sister.  One brother had throat cancer, another brother had liver cancer and the sister had uterine,  ovarian and colon cancer.  The sister daughter with melanoma in her 78's.  The maternal grandparents are deceased.  The grandfather had colon cancer at 39 and the grandmother died from complications of a broken hip.  The patient's father had lung cancer at 41.  He had thee sisters and four brothers.  Most died of heart disease, one had lung cancer.  The paternal grandparents are deceased from non cancer related issues.  Brittany Vincent is aware of previous family history of genetic testing for hereditary cancer risks. Patient's maternal ancestors are of [place] descent, and paternal ancestors are of [place] and [place] descent. There is no reported Ashkenazi Jewish ancestry. There is no known consanguinity.  GENETIC COUNSELING ASSESSMENT: Brittany Vincent is a 65 y.o. female with a personal and family history of cancer which is somewhat suggestive of a hereditary cancer syndrome and predisposition to cancer given the combination of cancer in the family. We, therefore, discussed and recommended the following at today's visit.   DISCUSSION: We discussed that 5 - 10% of breast cancer is hereditary, with most cases associated with BRCA mutations. She has had BRCA testing in the past that is reportedly negative.  We discussed that there are other genes that can increase the risk for breast cancer as well as other cancers in her family.  There are other genes that can be associated with hereditary breast cancer syndromes.  These include ATM, CHEK2 and PALB2.  We discussed that testing is beneficial for several reasons including knowing how to follow individuals after completing their treatment, identifying whether potential treatment options such as PARP inhibitors would be beneficial, and understand if other family members could be at risk for cancer and allow them to undergo genetic testing.   We reviewed the characteristics, features and inheritance patterns of hereditary cancer syndromes. We also discussed genetic testing,  including the appropriate family members to test, the process of testing, insurance coverage and turn-around-time for results. We discussed the implications of a negative, positive, carrier and/or variant of uncertain significant result. We recommended Brittany Vincent pursue genetic testing for the CancerNext-Expanded+RNA gene panel. The CancerNext-Expanded gene panel offered by [facility] and includes sequencing and rearrangement analysis for the following 77 genes: AIP, ALK, APC*, ATM*, AXIN2, BAP1, BARD1, BLM, BMPR1A, BRCA1*, BRCA2*, BRIP1*, CDC73, CDH1*, CDK4, CDKN1B, CDKN2A, CHEK2*, CTNNA1, DICER1, FANCC, FH, FLCN, GALNT12, KIF1B, LZTR1, MAX, MEN1, MET, MLH1*, MSH2*, MSH3, MSH6*, MUTYH*, NBN, NF1*, NF2, NTHL1, PALB2*, PHOX2B, PMS2*, POT1, PRKAR1A, PTCH1, PTEN*, RAD51C*, RAD51D*, RB1, RECQL, RET, SDHA, SDHAF2, SDHB, SDHC, SDHD, SMAD4, SMARCA4, SMARCB1, SMARCE1, STK11, SUFU, TMEM127, TP53*, TSC1, TSC2, VHL and XRCC2 (sequencing and deletion/duplication); EGFR,  EGLN1, HOXB13, KIT, MITF, PDGFRA, POLD1, and POLE (sequencing only); EPCAM and GREM1 (deletion/duplication only). DNA and RNA analyses performed for * genes.   Based on Brittany Vincent personal and family history of cancer, she meets medical criteria for genetic testing. Despite that she meets criteria, she may still have an out of pocket cost. We discussed that if her out of pocket cost for testing is over $100, the laboratory will call and confirm whether she wants to proceed with testing.  If the out of pocket cost of testing is less than $100 she will be billed by the genetic testing laboratory.   PLAN: After considering the risks, benefits, and limitations, Brittany Vincent provided informed consent to pursue genetic testing and the blood sample was sent to [facility] for analysis of the CancerNext-Expanded+RNAinsight testing. Results should be available within approximately 2-3 weeks' time, at which point they will be disclosed by telephone to  Brittany Vincent, as will any additional recommendations warranted by these results. Brittany Vincent will receive a summary of her genetic counseling visit and a copy of her results once available. This information will also be available in Epic.   Lastly, we encouraged Brittany Vincent to remain in contact with cancer genetics annually so that we can continuously update the family history and inform her of any changes in cancer genetics and testing that may be of benefit for this family.   Brittany Vincent questions were answered to her satisfaction today. Our contact information was provided should additional questions or concerns arise. Thank you for the referral and allowing [place] to share in the care of your patient.   Norely Schlick P. Florene Glen, [place], [facility] Licensed, Insurance risk surveyor [email or web address] phone: [phone]  The patient was seen for a total of 30 minutes in face-to-face genetic counseling.  The patient brought her husband This patient was discussed with Drs. Magrinat, Lindi Adie and/or Burr Medico who agrees with the above.    _______________________________________________________________________ For Office Staff:  Number of people involved in session: 2 Was an Intern/ student involved with case: no

## 2020-12-13 NOTE — Progress Notes (Deleted)
REFERRING PROVIDER: Wendie Agreste, MD [address] [place],  [place] [address]  PRIMARY PROVIDER:  Wendie Agreste, MD  PRIMARY REASON FOR VISIT:  No diagnosis found.   HISTORY OF PRESENT ILLNESS:   Ms. Huffstetler, a 65 y.o. female, was seen for a [place] cancer genetics consultation at the request of Dr. Carlota Raspberry due to a {Personal/family:20331} history of {cancer/polyps}.  Ms. Henthorn presents to clinic today to discuss the possibility of a hereditary predisposition to cancer, genetic testing, and to further clarify her future cancer risks, as well as potential cancer risks for family members.   In ***, at the age of ***, Ms. Fleisher was diagnosed with {CA PATHOLOGY:63853} of the {right left (wildcard):15202} {CA WNUUV:25366}. The treatment plan ***.    *** Ms. Downs is a 65 y.o. female with no personal history of cancer.    CANCER HISTORY:  Oncology History   No history exists.     RISK FACTORS:  Menarche was at age ***.  First live birth at age ***.  OCP use for approximately {Numbers 1-12 multi-select:20307} years.  Ovaries intact: {Yes/No-Ex:120004}.  Hysterectomy: {Yes/No-Ex:120004}.  Menopausal status: {Menopause:31378}.  HRT use: {Numbers 1-12 multi-select:20307} years. Colonoscopy: {Yes/No-Ex:120004}; {normal/abnormal/not examined:14677}. Mammogram within the last year: {Yes/No-Ex:120004}. Number of breast biopsies: {Numbers 1-12 multi-select:20307}. Up to date with pelvic exams: {Yes/No-Ex:120004}. Any excessive radiation exposure in the past: {Yes/No-Ex:120004}  Past Medical History:  Diagnosis Date   Breast cancer ([place]) 2013   Depression    Family history of malignant neoplasm of digestive organ    Family history of malignant neoplasm of ovary    Family history of melanoma    Family history of uterine cancer    Hyperlipidemia    Personal history of malignant neoplasm of breast    Thyroid disease     Past Surgical History:  Procedure Laterality Date    BREAST SURGERY     BUNIONECTOMY Right     Social History   Socioeconomic History   Marital status: Married    Spouse name: Not on file   Number of children: 3   Years of education: Not on file   Highest education level: Not on file  Occupational History   Occupation: RN  Tobacco Use   Smoking status: Never   Smokeless tobacco: Never  Vaping Use   Vaping Use: Never used  Substance and Sexual Activity   Alcohol use: No   Drug use: No   Sexual activity: Not on file  Other Topics Concern   Not on file  Social History Narrative   Lives with her husband.   Older son lives in [place], [place] with his wife and their 2 children.   Younger Nature conservation officer) son lives in [place], with his wife Lisbeth Renshaw) and their daughter, Ava.   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   Daughter recently moved to [place].   Moved to [place] from [place], [place] to be nearer to [place], [place] and Ava.   Social Determinants of Health   Financial Resource Strain: Not on file  Food Insecurity: Not on file  Transportation Needs: Not on file  Physical Activity: Not on file  Stress: Not on file  Social Connections: Not on file     FAMILY HISTORY:  We obtained a detailed, 4-generation family history.  Significant diagnoses are listed below: Family History  Problem Relation Age of Onset   Heart disease Mother    Cancer Father    Stroke  Maternal Grandmother    Cancer Maternal Grandfather    Heart disease Paternal Grandmother    Stroke Paternal Grandfather     Ms. Petterson is {aware/unaware} of previous family history of genetic testing for hereditary cancer risks. Patient's maternal ancestors are of *** descent, and paternal ancestors are of *** descent. There {IS NO:12509} reported Ashkenazi Jewish ancestry. There {IS NO:12509} known consanguinity.  GENETIC COUNSELING ASSESSMENT: Ms. Accardi is a 65 y.o. female with a {Personal/family:20331} history of {cancer/polyps} which is somewhat  suggestive of a {DISEASE} and predisposition to cancer given ***. We, therefore, discussed and recommended the following at today's visit.   DISCUSSION: We discussed that *** - ***% of *** is hereditary, with most cases associated with ***.  There are other genes that can be associated with hereditary *** cancer syndromes.  These include ***.  We discussed that testing is beneficial for several reasons including knowing how to follow individuals after completing their treatment, identifying whether potential treatment options such as PARP inhibitors would be beneficial, and understand if other family members could be at risk for cancer and allow them to undergo genetic testing.   We reviewed the characteristics, features and inheritance patterns of hereditary cancer syndromes. We also discussed genetic testing, including the appropriate family members to test, the process of testing, insurance coverage and turn-around-time for results. We discussed the implications of a negative, positive, carrier and/or variant of uncertain significant result. We recommended Ms. Arroyo pursue genetic testing for the *** gene panel.   Based on Ms. Franson {Personal/family:20331} history of cancer, she meets medical criteria for genetic testing. Despite that she meets criteria, she may still have an out of pocket cost. We discussed that if her out of pocket cost for testing is over $100, the laboratory will call and confirm whether she wants to proceed with testing.  If the out of pocket cost of testing is less than $100 she will be billed by the genetic testing laboratory.  reviewed the characteristics, features and inheritance patterns of hereditary cancer syndromes. We also discussed genetic testing, including the appropriate family members to test, the process of testing, insurance coverage and turn-around-time for results. We discussed the implications of a negative, positive and/or variant of uncertain significant result.  In order to get genetic test results in a timely manner so that Ms. Grundman can use these genetic test results for surgical decisions, we recommended Ms. Hietpas pursue genetic testing for the ***. Once complete, we recommend Ms. Desilets pursue reflex genetic testing to the *** gene panel.   Based on Ms. Mumford {Personal/family:20331} history of cancer, she meets medical criteria for genetic testing. Despite that she meets criteria, she may still have an out of pocket cost.  discussed with Ms. Cutillo that the {Personal/family:20331} history does not meet insurance or NCCN criteria for genetic testing and, therefore, is not highly consistent with a familial hereditary cancer syndrome.  We feel she is at low risk to harbor a gene mutation associated with such a condition. Thus, we did not recommend any genetic testing, at this time, and recommended Ms. Nunley continue to follow the cancer screening guidelines given by her primary healthcare provider. order to estimate her chance of having a {CA GENE:62345} mutation, we used statistical models ({GENMODELS:62370}) that consider her personal medical history, family history and ancestry.  Because each model is different, there can be a lot of variability in the risks they give.  Therefore, these numbers must be considered a rough range and not  a precise risk of having a {CA GENE:62345} mutation.  These models estimate that she has approximately a ***-***% chance of having a mutation. Based on this assessment of her family and personal history, genetic testing {IS/ISNOT:34056} recommended.  on the patient's {Personal/family:20331} history, a statistical model ({GENMODELS:62370}) was used to estimate her risk of developing {CA HX:54794}. This estimates her lifetime risk of developing {CA HX:54794} to be approximately ***%. This estimation does not consider any genetic testing results.  The patient's lifetime breast cancer risk is a preliminary estimate based on available  information using one of several models endorsed by the [place] (ACS). The ACS recommends consideration of breast MRI screening as an adjunct to mammography for patients at high risk (defined as 20% or greater lifetime risk). Please note that a woman's breast cancer risk changes over time. It may increase or decrease based on age and any changes to the personal and/or family medical history. The risks and recommendations listed above apply to this patient at this point in time. In the future, she may or may not be eligible for the same medical management strategies and, in some cases, other medical management strategies may become available to her. If she is interested in an updated breast cancer risk assessment at a later date, she can contact us. Ms. Nowaczyk has been determined to be at high risk for breast cancer.  Therefore, we recommend that annual screening with mammography and breast MRI be performed.  ***begin at age 24, or 10 years prior to the age of breast cancer diagnosis in a relative (whichever is earlier).  We discussed that Ms. Anthis should discuss her individual situation with her referring physician and determine a breast cancer screening plan with which they are both comfortable.    PLAN: After considering the risks, benefits, and limitations, Ms. Cude provided informed consent to pursue genetic testing and the blood sample was sent to {Lab} Laboratories for analysis of the {test}. Results should be available within approximately {TAT TIME} weeks' time, at which point they will be disclosed by telephone to Ms. Carrol, as will any additional recommendations warranted by these results. Ms. Randhawa will receive a summary of her genetic counseling visit and a copy of her results once available. This information will also be available in Epic.   Lastly, we encouraged Ms. Bujak to remain in contact with cancer genetics annually so that we can continuously update the family history and inform  her of any changes in cancer genetics and testing that may be of benefit for this family.   Ms. Lastinger questions were answered to her satisfaction today. Our contact information was provided should additional questions or concerns arise. Thank you for the referral and allowing [place] to share in the care of your patient.   Baila Rouse P. Florene Glen, [place], [facility] Licensed, Insurance risk surveyor [email or web address] phone: [phone]  The patient was seen for a total of *** minutes in face-to-face genetic counseling.  The patient brought her husband This patient was discussed with Drs. Magrinat, Lindi Adie and/or Burr Medico who agrees with the above.    _______________________________________________________________________ For Office Staff:  Number of people involved in session: *** Was an Intern/ student involved with case: {YES/NO:63}

## 2020-12-14 ENCOUNTER — Other Ambulatory Visit: Payer: Self-pay

## 2020-12-14 ENCOUNTER — Ambulatory Visit (INDEPENDENT_AMBULATORY_CARE_PROVIDER_SITE_OTHER): Payer: No Typology Code available for payment source

## 2020-12-14 DIAGNOSIS — I4510 Unspecified right bundle-branch block: Secondary | ICD-10-CM

## 2020-12-14 DIAGNOSIS — R002 Palpitations: Secondary | ICD-10-CM | POA: Diagnosis not present

## 2020-12-14 LAB — EXERCISE TOLERANCE TEST
Estimated workload: 5 METS
Exercise duration (min): 3 min
Exercise duration (sec): 38 s
MPHR: 156 {beats}/min
Peak HR: 134 {beats}/min
Percent HR: 134 %
RPE: 19
Rest HR: 70 {beats}/min

## 2020-12-14 NOTE — Telephone Encounter (Signed)
Received approval from [facility] for CPAP titration sleep study. Auth # [id number]. Valid dates 12/13/20 to 06/11/21. Message sent to [facility] for scheduling.

## 2020-12-14 NOTE — Telephone Encounter (Signed)
Pt would like for you to contact her because her Insurance company approved the CPAP prescription or medical equipment we use.

## 2020-12-15 ENCOUNTER — Telehealth: Payer: Self-pay

## 2020-12-15 DIAGNOSIS — R030 Elevated blood-pressure reading, without diagnosis of hypertension: Secondary | ICD-10-CM

## 2020-12-15 NOTE — Telephone Encounter (Signed)
-----   Message from Sueanne Margarita, MD sent at 12/14/2020  4:03 PM EDT ----- Please let patient know that stress test was fine but BP got very high.  PLease get a 48 hour BP monitor

## 2020-12-20 ENCOUNTER — Other Ambulatory Visit: Payer: Self-pay

## 2020-12-20 ENCOUNTER — Other Ambulatory Visit: Payer: No Typology Code available for payment source | Admitting: *Deleted

## 2020-12-20 DIAGNOSIS — I4510 Unspecified right bundle-branch block: Secondary | ICD-10-CM

## 2020-12-20 DIAGNOSIS — R002 Palpitations: Secondary | ICD-10-CM

## 2020-12-20 DIAGNOSIS — G4719 Other hypersomnia: Secondary | ICD-10-CM

## 2020-12-21 LAB — BASIC METABOLIC PANEL
BUN/Creatinine Ratio: 12 (ref 12–28)
BUN: 10 mg/dL (ref 8–27)
CO2: 23 mmol/L (ref 20–29)
Calcium: 9.9 mg/dL (ref 8.7–10.3)
Chloride: 102 mmol/L (ref 96–106)
Creatinine, Ser: 0.82 mg/dL (ref 0.57–1.00)
Glucose: 146 mg/dL — ABNORMAL HIGH (ref 65–99)
Potassium: 4.5 mmol/L (ref 3.5–5.2)
Sodium: 142 mmol/L (ref 134–144)
eGFR: 80 mL/min/{1.73_m2} (ref >59)

## 2020-12-21 NOTE — Telephone Encounter (Signed)
Reached out to patient and informed her I have her on the list of patients to schedule sleep studies. Patient was grateful for the call.

## 2020-12-22 ENCOUNTER — Encounter: Payer: Self-pay | Admitting: Genetic Counselor

## 2020-12-22 ENCOUNTER — Ambulatory Visit: Payer: Self-pay | Admitting: Genetic Counselor

## 2020-12-22 ENCOUNTER — Telehealth: Payer: Self-pay | Admitting: Genetic Counselor

## 2020-12-22 DIAGNOSIS — Z1379 Encounter for other screening for genetic and chromosomal anomalies: Secondary | ICD-10-CM

## 2020-12-22 NOTE — Telephone Encounter (Signed)
Revealed negative genetic testing.  Discussed that we do not know why she has has had breast cancer or why there is cancer in the family. It could be due to a different gene that we are not testing, or maybe our current technology may not be able to pick something up.  It will be important for her to keep in contact with genetics to keep up with whether additional testing may be needed.

## 2020-12-22 NOTE — Addendum Note (Signed)
Addended by: Freada Bergeron on: 12/22/2020 01:13 PM   Modules accepted: Orders

## 2020-12-22 NOTE — Progress Notes (Signed)
HPI:  Brittany Vincent was previously seen in the [facility] due to a personal and family history of cancer and concerns regarding a hereditary predisposition to cancer. Please refer to our prior cancer genetics clinic note for more information regarding our discussion, assessment and recommendations, at the time. Brittany Vincent recent genetic test results were disclosed to her, as were recommendations warranted by these results. These results and recommendations are discussed in more detail below.  CANCER HISTORY:  Oncology History   No history exists.    FAMILY HISTORY:  We obtained a detailed, 4-generation family history.  Significant diagnoses are listed below: Family History  Problem Relation Age of Onset   Heart disease Mother    Cancer Father    Stroke Maternal Grandmother    Cancer Maternal Grandfather    Heart disease Paternal Grandmother    Stroke Paternal Grandfather     The patient has two sons and an adopted daughter who are all cancer free.  She is an only child. Both of her parents are living.   The patient's mother had uterine cancer in her 87's and died at 64 from dementia.  She had two brothers and a sister.  One brother had throat cancer, another brother had liver cancer and the sister had uterine, ovarian and colon cancer.  The sister daughter with melanoma in her 37's.  The maternal grandparents are deceased.  The grandfather had colon cancer at 106 and the grandmother died from complications of a broken hip.   The patient's father had lung cancer at 73.  He had thee sisters and four brothers.  Most died of heart disease, one had lung cancer.  The paternal grandparents are deceased from non cancer related issues.   Brittany Vincent is aware of previous family history of genetic testing for hereditary cancer risks. Patient's maternal ancestors are of [place] descent, and paternal ancestors are of [place] and [place] descent. There is no reported Ashkenazi Jewish ancestry.  There is no known consanguinity.  GENETIC TEST RESULTS: Genetic testing reported out on December 22, 2020 through the CancerNext-Expanded+RNAinsight cancer panel found no pathogenic mutations. The CancerNext-Expanded gene panel offered by [facility] and includes sequencing and rearrangement analysis for the following 77 genes: AIP, ALK, APC*, ATM*, AXIN2, BAP1, BARD1, BLM, BMPR1A, BRCA1*, BRCA2*, BRIP1*, CDC73, CDH1*, CDK4, CDKN1B, CDKN2A, CHEK2*, CTNNA1, DICER1, FANCC, FH, FLCN, GALNT12, KIF1B, LZTR1, MAX, MEN1, MET, MLH1*, MSH2*, MSH3, MSH6*, MUTYH*, NBN, NF1*, NF2, NTHL1, PALB2*, PHOX2B, PMS2*, POT1, PRKAR1A, PTCH1, PTEN*, RAD51C*, RAD51D*, RB1, RECQL, RET, SDHA, SDHAF2, SDHB, SDHC, SDHD, SMAD4, SMARCA4, SMARCB1, SMARCE1, STK11, SUFU, TMEM127, TP53*, TSC1, TSC2, VHL and XRCC2 (sequencing and deletion/duplication); EGFR, EGLN1, HOXB13, KIT, MITF, PDGFRA, POLD1, and POLE (sequencing only); EPCAM and GREM1 (deletion/duplication only). DNA and RNA analyses performed for * genes. The test report has been scanned into EPIC and is located under the Molecular Pathology section of the Results Review tab.  A portion of the result report is included below for reference.     We discussed with Brittany Vincent that because current genetic testing is not perfect, it is possible there may be a gene mutation in one of these genes that current testing cannot detect, but that chance is small.  We also discussed, that there could be another gene that has not yet been discovered, or that we have not yet tested, that is responsible for the cancer diagnoses in the family. It is also possible there is a hereditary cause for the cancer in  the family that Brittany Vincent did not inherit and therefore was not identified in her testing.  Therefore, it is important to remain in touch with cancer genetics in the future so that we can continue to offer Brittany Vincent the most up to date genetic testing.   ADDITIONAL GENETIC TESTING: We discussed with [name] that her genetic testing was fairly extensive.  If there are genes identified to increase cancer risk that can be analyzed in the future, we would be happy to discuss and coordinate this testing at that time.    CANCER SCREENING RECOMMENDATIONS: Brittany Vincent test result is considered negative (normal).  This means that we have not identified a hereditary cause for her personal and family history of cancer at this time. Most cancers happen by chance and this negative test suggests that her cancer may fall into this category.    While reassuring, this does not definitively rule out a hereditary predisposition to cancer. It is still possible that there could be genetic mutations that are undetectable by current technology. There could be genetic mutations in genes that have not been tested or identified to increase cancer risk.  Therefore, it is recommended she continue to follow the cancer management and screening guidelines provided by her oncology and primary healthcare provider.   An individual's cancer risk and medical management are not determined by genetic test results alone. Overall cancer risk assessment incorporates additional factors, including personal medical history, family history, and any available genetic information that may result in a personalized plan for cancer prevention and surveillance  RECOMMENDATIONS FOR FAMILY MEMBERS:  Individuals in this family might be at some increased risk of developing cancer, over the general population risk, simply due to the family history of cancer.  We recommended women in this family have a yearly mammogram beginning at age 40, or 45 years younger than the earliest onset of cancer, an annual clinical breast exam, and perform monthly breast self-exams. Women in this family should also have a gynecological exam as recommended by their primary provider. All family members should be referred for colonoscopy starting at age 59.  It is also possible there  is a hereditary cause for the cancer in Brittany Vincent's family that she did not inherit and therefore was not identified in her.  Based on Brittany Vincent family history, none of her family members with cancer are currently living, but we recommended others have genetic counseling and testing. Brittany Vincent will let us know if we can be of any assistance in coordinating genetic counseling and/or testing for this family member.   FOLLOW-UP: Lastly, we discussed with Brittany Vincent that cancer genetics is a rapidly advancing field and it is possible that new genetic tests will be appropriate for her and/or her family members in the future. We encouraged her to remain in contact with cancer genetics on an annual basis so we can update her personal and family histories and let her know of advances in cancer genetics that may benefit this family.   Our contact number was provided. Brittany Vincent questions were answered to her satisfaction, and she knows she is welcome to call us at anytime with additional questions or concerns.   Roma Kayser, [place], [facility] Licensed, Certified Genetic Counselor [email or web address]

## 2020-12-27 NOTE — Telephone Encounter (Signed)
Patient is scheduled for lab study on 03/02/21. Patient understands her sleep study will be done at [facility] sleep lab. Patient understands she will receive a sleep packet in a week or so. Patient understands to call if she does not receive the sleep packet in a timely manner. Patient agrees with treatment and thanked me for call.

## 2021-01-01 ENCOUNTER — Ambulatory Visit: Payer: No Typology Code available for payment source | Admitting: Cardiology

## 2021-01-02 ENCOUNTER — Telehealth: Payer: Self-pay | Admitting: *Deleted

## 2021-01-02 ENCOUNTER — Institutional Professional Consult (permissible substitution): Payer: No Typology Code available for payment source | Admitting: Neurology

## 2021-01-02 NOTE — Telephone Encounter (Signed)
Attempted to contact patient to schedule 24 Hour Ambulatory Blood Pressure monitor.  No answer, No DPR.  Available appointments Thursday 7/14 or Monday 7/18.  If patient calls, please forward to [facility] in [place].

## 2021-01-08 ENCOUNTER — Encounter (INDEPENDENT_AMBULATORY_CARE_PROVIDER_SITE_OTHER): Payer: No Typology Code available for payment source | Admitting: Ophthalmology

## 2021-01-08 ENCOUNTER — Other Ambulatory Visit: Payer: Self-pay

## 2021-01-08 DIAGNOSIS — H33301 Unspecified retinal break, right eye: Secondary | ICD-10-CM | POA: Diagnosis not present

## 2021-01-08 DIAGNOSIS — H2513 Age-related nuclear cataract, bilateral: Secondary | ICD-10-CM

## 2021-01-08 DIAGNOSIS — H35033 Hypertensive retinopathy, bilateral: Secondary | ICD-10-CM

## 2021-01-08 DIAGNOSIS — H43813 Vitreous degeneration, bilateral: Secondary | ICD-10-CM

## 2021-01-08 DIAGNOSIS — I10 Essential (primary) hypertension: Secondary | ICD-10-CM | POA: Diagnosis not present

## 2021-01-11 ENCOUNTER — Encounter (INDEPENDENT_AMBULATORY_CARE_PROVIDER_SITE_OTHER): Payer: Self-pay | Admitting: Family Medicine

## 2021-01-11 ENCOUNTER — Other Ambulatory Visit (INDEPENDENT_AMBULATORY_CARE_PROVIDER_SITE_OTHER): Payer: Self-pay | Admitting: Family Medicine

## 2021-01-11 ENCOUNTER — Ambulatory Visit (INDEPENDENT_AMBULATORY_CARE_PROVIDER_SITE_OTHER): Payer: No Typology Code available for payment source | Admitting: Family Medicine

## 2021-01-11 ENCOUNTER — Other Ambulatory Visit: Payer: Self-pay

## 2021-01-11 VITALS — BP 122/79 | HR 69 | Temp 98.4°F | Ht 67.0 in | Wt 203.0 lb

## 2021-01-11 DIAGNOSIS — R0609 Other forms of dyspnea: Secondary | ICD-10-CM

## 2021-01-11 DIAGNOSIS — E1165 Type 2 diabetes mellitus with hyperglycemia: Secondary | ICD-10-CM

## 2021-01-11 DIAGNOSIS — E038 Other specified hypothyroidism: Secondary | ICD-10-CM

## 2021-01-11 DIAGNOSIS — Z9189 Other specified personal risk factors, not elsewhere classified: Secondary | ICD-10-CM | POA: Diagnosis not present

## 2021-01-11 DIAGNOSIS — R0600 Dyspnea, unspecified: Secondary | ICD-10-CM | POA: Diagnosis not present

## 2021-01-11 DIAGNOSIS — Z0289 Encounter for other administrative examinations: Secondary | ICD-10-CM

## 2021-01-11 DIAGNOSIS — I152 Hypertension secondary to endocrine disorders: Secondary | ICD-10-CM

## 2021-01-11 DIAGNOSIS — E782 Mixed hyperlipidemia: Secondary | ICD-10-CM

## 2021-01-11 DIAGNOSIS — E1159 Type 2 diabetes mellitus with other circulatory complications: Secondary | ICD-10-CM | POA: Diagnosis not present

## 2021-01-11 DIAGNOSIS — F3289 Other specified depressive episodes: Secondary | ICD-10-CM | POA: Diagnosis not present

## 2021-01-11 DIAGNOSIS — Z6831 Body mass index (BMI) 31.0-31.9, adult: Secondary | ICD-10-CM

## 2021-01-11 DIAGNOSIS — E669 Obesity, unspecified: Secondary | ICD-10-CM

## 2021-01-11 DIAGNOSIS — R5383 Other fatigue: Secondary | ICD-10-CM

## 2021-01-11 DIAGNOSIS — R7989 Other specified abnormal findings of blood chemistry: Secondary | ICD-10-CM

## 2021-01-12 LAB — INSULIN, RANDOM: INSULIN: 20.3 u[IU]/mL (ref 2.6–24.9)

## 2021-01-12 LAB — VITAMIN B12: Vitamin B-12: 569 pg/mL (ref 232–1245)

## 2021-01-12 LAB — FOLATE: Folate: 18.2 ng/mL (ref >3.0)

## 2021-01-16 ENCOUNTER — Encounter (INDEPENDENT_AMBULATORY_CARE_PROVIDER_SITE_OTHER): Payer: No Typology Code available for payment source | Admitting: Ophthalmology

## 2021-01-17 ENCOUNTER — Encounter (INDEPENDENT_AMBULATORY_CARE_PROVIDER_SITE_OTHER): Payer: No Typology Code available for payment source | Admitting: Ophthalmology

## 2021-01-17 ENCOUNTER — Other Ambulatory Visit: Payer: Self-pay

## 2021-01-17 DIAGNOSIS — H33301 Unspecified retinal break, right eye: Secondary | ICD-10-CM

## 2021-01-20 ENCOUNTER — Other Ambulatory Visit: Payer: Self-pay | Admitting: Registered Nurse

## 2021-01-20 DIAGNOSIS — R030 Elevated blood-pressure reading, without diagnosis of hypertension: Secondary | ICD-10-CM

## 2021-01-21 NOTE — Progress Notes (Signed)
Dear Dr. Fransico Vincent,   Thank you for referring Brittany Vincent to our clinic. The following note includes my evaluation and treatment recommendations.  Chief Complaint:   OBESITY Brittany Vincent (MR# [id number]) is a 65 y.o. female who presents for evaluation and treatment of obesity and related comorbidities. Current BMI is Body mass index is 31.79 kg/m. Brittany Vincent has been struggling with her weight for many years and has been unsuccessful in either losing weight, maintaining weight loss, or reaching her healthy weight goal.  Brittany Vincent is currently in the action stage of change and ready to dedicate time achieving and maintaining a healthier weight. Brittany Vincent is interested in becoming our patient and working on intensive lifestyle modifications including (but not limited to) diet and exercise for weight loss.  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 She used Rickard Patience along with regular walking and lost 30 lbs years ago. She craves meat and vegetables. Her worst habit is portion control. Pt is lactose intolerant.    Kary's habits were reviewed today and are as follows: Her family eats meals together, she thinks her family will eat healthier with her, her desired weight loss is 28 lbs, she started gaining weight in her early 17's, her heaviest weight ever was 215 pounds, she has significant food cravings issues, she is frequently drinking liquids with calories, she has problems with excessive hunger, she frequently eats larger portions than normal, she has binge eating behaviors, and she struggles with emotional eating.  Depression Screen Brittany Vincent's Food and Mood (modified PHQ-9) score was 9.  Depression screen Brittany Vincent 2/9 01/11/2021  Decreased Interest 0  Down, Depressed, Hopeless 0  PHQ - 2 Score 0  Altered sleeping 3  Tired, decreased energy 3  Change in appetite 3  Feeling bad or failure about yourself  0  Trouble concentrating 0  Moving slowly or  fidgety/restless 0  Suicidal thoughts 0  PHQ-9 Score 9  Difficult doing work/chores Not difficult at all   Subjective:   1. Other fatigue Brittany Vincent admits to daytime somnolence and admits to waking up still tired. Patent has a history of symptoms of daytime fatigue, morning fatigue, and morning headache. Brittany Vincent generally gets  8-10  hours of sleep per night, and states that she has poor sleep quality. Snoring is present. Apneic episodes are present. Epworth Sleepiness Score is 9. She has a history of OSA with CPAP titration Sept 9th. Pt understands the importance of controlling OSA symptoms for better BP control, bette energy, etc.  2. Dyspnea on exertion Zan notes increasing shortness of breath with exercising and seems to be worsening over time with weight gain. She notes getting out of breath sooner with activity than she used to. This has gotten worse recently. [name] denies shortness of breath at rest or orthopnea.  3. Type 2 diabetes mellitus with hyperglycemia, without long-term current use of insulin (Brittany Vincent) Brittany Vincent was first diagnosed 1.5 years ago. Her last A1c was 7.2 a month ago. She was told to increase Metformin to BID, but pt has not done so yet. She "dislikes taking medicine".  4. Hypertension associated with diabetes (Brittany Vincent) Managed by Dr. Fransico Vincent of cardiology. Brittany Vincent is taking losartan 25 mg and diltiazem 180 mg.  BP Readings from Last 3 Encounters:  01/11/21 122/79  11/15/20 134/78  11/06/20 132/76   Lab Results  Component Value Date   CREATININE 0.82 12/20/2020   CREATININE 0.70 11/15/2020   CREATININE 0.77 10/24/2020  5. Mixed hyperlipidemia Brittany Vincent has hyperlipidemia and is taking Crestor. She has been trying to improve her cholesterol levels with intensive lifestyle modification including a low saturated fat diet, exercise and weight loss. She denies any chest pain, claudication or myalgias.  Lab Results  Component Value Date   ALT 35 11/15/2020   AST 45  (H) 11/15/2020   ALKPHOS 60 11/15/2020   BILITOT 0.5 11/15/2020   Lab Results  Component Value Date   CHOL 169 11/15/2020   HDL 42.60 11/15/2020   LDLCALC 85 12/22/2019   LDLDIRECT 100.0 11/15/2020   TRIG 271.0 (H) 11/15/2020   CHOLHDL 4 11/15/2020   6. Other depression, with emotional eating Pt's PHQ-9 score is only 9. Her symptoms are well controlled with Lexapro.  7. Other specified hypothyroidism TSH lab was recently obtained and is within normal limits, via PCP. Brittany Vincent is taking levothyroxine.  8. At risk for heart disease Brittany Vincent is at a higher than average risk for cardiovascular disease due to obesity, diabetes mellitus, hyperlipidemia, hypertension, etc.  Assessment/Plan:   1. Other fatigue Brittany Vincent does feel that her weight is causing her energy to be lower than it should be. Fatigue may be related to obesity, depression or many other causes. Labs will be ordered, and in the meanwhile, Brittany Vincent will focus on self care including making healthy food choices, increasing physical activity and focusing on stress reduction. Check labs today.  - Folate - Vitamin B12  2. Dyspnea on exertion Brittany Vincent does feel that she gets out of breath more easily that she used to when she exercises. Brittany Vincent's shortness of breath appears to be obesity related and exercise induced. She has agreed to work on weight loss and gradually increase exercise to treat her exercise induced shortness of breath. Will continue to monitor closely. Check labs today.  - Folate - Vitamin B12  3. Type 2 diabetes mellitus with hyperglycemia, without long-term current use of insulin ([name]) Not at goal. Brittany Vincent will practice prudent nutritional plan and weight loss. We will closely monitor. Good blood sugar control is important to decrease the likelihood of diabetic complications such as nephropathy, neuropathy, limb loss, blindness, coronary artery disease, and death. Intensive lifestyle modification including diet,  exercise and weight loss are the first line of treatment for diabetes.  Check labs today.  - Insulin, random  4. Hypertension associated with diabetes ([place]) Brittany Vincent's BP is at goal today. She is working on healthy weight loss and exercise to improve blood pressure control. We will watch for signs of hypotension as she continues her lifestyle modifications. Continue management per cardiology.  5. Mixed hyperlipidemia Cardiovascular risk and specific lipid/LDL goals reviewed.  We discussed several lifestyle modifications today and Brittany Vincent will continue to work on diet, exercise and weight loss efforts. Orders and follow up as documented in patient record. Continue current treatment plan.  Counseling Intensive lifestyle modifications are the first line treatment for this issue. Dietary changes: Increase soluble fiber. Decrease simple carbohydrates. Exercise changes: Moderate to vigorous-intensity aerobic activity 150 minutes per week if tolerated. Lipid-lowering medications: see documented in medical record.  6. Other depression, with emotional eating Emotional eating habits are mild and pt's mood is currently controlled. We will monitor closely every 2-3 weeks. Behavior modification techniques were discussed today to help Brittany Vincent deal with her emotional/non-hunger eating behaviors.  Orders and follow up as documented in patient record.   7. Other specified hypothyroidism Patient with long-standing hypothyroidism, on levothyroxine therapy. She appears euthyroid. Orders and follow up as documented  in patient record.  Counseling Good thyroid control is important for overall health. Brittany Vincent thyroid levels are dangerous and will not improve weight loss results. The correct way to take levothyroxine is fasting, with water, separated by at least 30 minutes from breakfast, and separated by more than 4 hours from calcium, iron, multivitamins, acid reflux medications (PPIs).   8. At risk for  heart disease Brittany Vincent was given approximately 30 minutes of coronary artery disease prevention counseling today. She is 65 y.o. female and has risk factors for heart disease including obesity. We discussed intensive lifestyle modifications today with an emphasis on specific weight loss instructions and strategies.   Repetitive spaced learning was employed today to elicit superior memory formation and behavioral change.   9. Class 1 obesity with serious comorbidity and body mass index (BMI) of 31.0 to 31.9 in adult, unspecified obesity type  Brittany Vincent is currently in the action stage of change and her goal is to continue with weight loss efforts. I recommend Brittany Vincent begin the structured treatment plan as follows:  She has agreed to the Category 3 Plan.  Exercise goals:  As is.    Behavioral modification strategies: increasing lean protein intake, decreasing simple carbohydrates, no skipping meals, meal planning and cooking strategies, keeping healthy foods in the home, and planning for success.  She was informed of the importance of frequent follow-up visits to maximize her success with intensive lifestyle modifications for her multiple health conditions. She was informed we would discuss her lab results at her next visit unless there is a critical issue that needs to be addressed sooner. Brittany Vincent agreed to keep her next visit at the agreed upon time to discuss these results.  Objective:   Blood pressure 122/79, pulse 69, temperature 98.4 F (36.9 C), height '5\' 7"'$  (1.702 m), weight 203 lb (92.1 kg), SpO2 98 %. Body mass index is 31.79 kg/m.  EKG: Sinus rhythm, rate 77. Abnormal EKG [facility] 10/24/2020- Low voltage QRS, possible anterolateral infarct.  Indirect Calorimeter completed today shows a VO2 of 274 and a REE of 1886.  Her calculated basal metabolic rate is [id number] thus her basal metabolic rate is better than expected.  General: Cooperative, alert, well developed, in no acute  distress. HEENT: Conjunctivae and lids unremarkable. Cardiovascular: Regular rhythm.  Lungs: Normal work of breathing. Neurologic: No focal deficits.   Lab Results  Component Value Date   CREATININE 0.82 12/20/2020   BUN 10 12/20/2020   NA 142 12/20/2020   K 4.5 12/20/2020   CL 102 12/20/2020   CO2 23 12/20/2020   Lab Results  Component Value Date   ALT 35 11/15/2020   AST 45 (H) 11/15/2020   ALKPHOS 60 11/15/2020   BILITOT 0.5 11/15/2020   Lab Results  Component Value Date   HGBA1C 7.2 (H) 11/15/2020   HGBA1C 6.4 (H) 04/12/2020   HGBA1C 7.2 (H) 11/09/2019   HGBA1C 6.4 (H) 03/19/2019   Lab Results  Component Value Date   INSULIN 20.3 01/11/2021   Lab Results  Component Value Date   TSH 1.33 11/15/2020   Lab Results  Component Value Date   CHOL 169 11/15/2020   HDL 42.60 11/15/2020   LDLCALC 85 12/22/2019   LDLDIRECT 100.0 11/15/2020   TRIG 271.0 (H) 11/15/2020   CHOLHDL 4 11/15/2020   Lab Results  Component Value Date   WBC 7.4 10/24/2020   HGB 15.0 10/24/2020   HCT 45.6 10/24/2020   MCV 99.8 10/24/2020   PLT 289 10/24/2020  Attestation Statements:   Reviewed by clinician on day of visit: allergies, medications, problem list, medical history, surgical history, family history, social history, and previous encounter notes.  Coral Ceo, CMA, am acting as transcriptionist for [employer], DO.  I have reviewed the above documentation for accuracy and completeness, and I agree with the above. Marjory Sneddon, D.O.  The [place] was signed into law in 2016 which includes the topic of electronic health records.  This provides immediate access to information in MyChart.  This includes consultation notes, operative notes, office notes, lab results and pathology reports.  If you have any questions about what you read please let us know at your next visit so we can discuss your concerns and take corrective action if need be.  We are  right here with you.

## 2021-01-25 ENCOUNTER — Other Ambulatory Visit: Payer: Self-pay

## 2021-01-25 ENCOUNTER — Encounter (INDEPENDENT_AMBULATORY_CARE_PROVIDER_SITE_OTHER): Payer: Self-pay | Admitting: Family Medicine

## 2021-01-25 ENCOUNTER — Ambulatory Visit (INDEPENDENT_AMBULATORY_CARE_PROVIDER_SITE_OTHER): Payer: No Typology Code available for payment source | Admitting: Family Medicine

## 2021-01-25 VITALS — BP 116/74 | HR 72 | Temp 98.2°F | Ht 67.0 in | Wt 201.0 lb

## 2021-01-25 DIAGNOSIS — Z9189 Other specified personal risk factors, not elsewhere classified: Secondary | ICD-10-CM | POA: Diagnosis not present

## 2021-01-25 DIAGNOSIS — E669 Obesity, unspecified: Secondary | ICD-10-CM | POA: Diagnosis not present

## 2021-01-25 DIAGNOSIS — E66811 Obesity, class 1: Secondary | ICD-10-CM

## 2021-01-25 DIAGNOSIS — I152 Hypertension secondary to endocrine disorders: Secondary | ICD-10-CM

## 2021-01-25 DIAGNOSIS — E559 Vitamin D deficiency, unspecified: Secondary | ICD-10-CM | POA: Diagnosis not present

## 2021-01-25 DIAGNOSIS — E1159 Type 2 diabetes mellitus with other circulatory complications: Secondary | ICD-10-CM

## 2021-01-25 DIAGNOSIS — Z6831 Body mass index (BMI) 31.0-31.9, adult: Secondary | ICD-10-CM

## 2021-01-25 DIAGNOSIS — E119 Type 2 diabetes mellitus without complications: Secondary | ICD-10-CM | POA: Diagnosis not present

## 2021-01-25 MED ORDER — METFORMIN HCL 500 MG PO TABS: ORAL_TABLET | ORAL | 0 refills | Status: DC

## 2021-01-25 NOTE — Patient Instructions (Signed)
Health Maintenance Due  Topic Date Due   Pneumococcal Vaccine 43-65 Years old (1 - PCV) Never done   Zoster Vaccines- Shingrix (1 of 2) Never done   INFLUENZA VACCINE  01/22/2021    Depression screen [facility] 2/9 01/11/2021 11/15/2020 10/25/2020  Decreased Interest 0 0 0  Down, Depressed, Hopeless 0 0 0  PHQ - 2 Score 0 0 0  Altered sleeping 3 2 -  Tired, decreased energy 3 3 -  Change in appetite 3 2 -  Feeling bad or failure about yourself  0 0 -  Trouble concentrating 0 0 -  Moving slowly or fidgety/restless 0 0 -  Suicidal thoughts 0 0 -  PHQ-9 Score 9 7 -  Difficult doing work/chores Not difficult at all - -

## 2021-01-29 NOTE — Progress Notes (Signed)
Chief Complaint:   OBESITY Brittany Vincent is here to discuss her progress with her obesity treatment plan along with follow-up of her obesity related diagnoses. Brittany Vincent is on the Category 3 Plan and states she is following her eating plan approximately 90% of the time. Brittany Vincent states she is not currently exercising.  Today's visit was #: 2 Starting weight: 203 lbs Starting date: 01/11/2021 Today's weight: 201 lbs Today's date: 01/25/2021 Total lbs lost to date: 2 Total lbs lost since last in-office visit: 2  Interim History: Brittany Vincent is here today for her first follow-up office visit since starting the program with [place].  All blood work/ lab tests that were recently ordered by myself or an outside provider were reviewed with patient today per their request.   Extended time was spent counseling her on all new disease processes that were discovered or preexisting ones that are affected by BMI.  she understands that many of these abnormalities will need to monitored regularly along with the current treatment plan of prudent dietary changes, in which we are making each and every office visit, to improve these health parameters.  Brittany Vincent had pasta with salad for a couple of meals, and is not eating any on-plan bread. She is drinking 70 oz water a day. Pt denies cravings or hunger or issues with the plan  Subjective:   1. Type 2 diabetes mellitus without complication, without long-term current use of insulin ([name]) Worsening from prior. Discussed labs with patient today. FBS run 99-145. Brittany Vincent reports no lows but doesn't check regularly. She is on Metformin once a day.  Lab Results  Component Value Date   HGBA1C 7.2 (H) 11/15/2020   HGBA1C 6.4 (H) 04/12/2020   HGBA1C 7.2 (H) 11/09/2019   Lab Results  Component Value Date   MICROALBUR 3.2 (H) 11/15/2020   LDLCALC 85 12/22/2019   CREATININE 0.82 12/20/2020   Lab Results  Component Value Date   INSULIN 20.3 01/11/2021   2. Hypertension  associated with diabetes ([place]) Discussed labs with patient today. BP at goal. Brittany Vincent is taking losartan and Cardizem.  BP Readings from Last 3 Encounters:  01/25/21 116/74  01/11/21 122/79  11/15/20 134/78   Lab Results  Component Value Date   CREATININE 0.82 12/20/2020   CREATININE 0.70 11/15/2020   CREATININE 0.77 10/24/2020   3. Vitamin D deficiency She is currently taking no vitamin D supplement.   No results found for: VD25OH  Assessment/Plan:  No orders of the defined types were placed in this encounter.   Medications Discontinued During This Encounter  Medication Reason   metFORMIN (GLUCOPHAGE) 500 MG tablet Reorder     Meds ordered this encounter  Medications   metFORMIN (GLUCOPHAGE) 500 MG tablet    Sig: 1 po bid    Dispense:  60 tablet    Refill:  0    Ov for RF     1. Type 2 diabetes mellitus without complication, without long-term current use of insulin ([name]) Worsening A1c. Increase Metformin to BID. Good blood sugar control is important to decrease the likelihood of diabetic complications such as nephropathy, neuropathy, limb loss, blindness, coronary artery disease, and death. Intensive lifestyle modification including diet, exercise and weight loss are the first line of treatment for diabetes.   Refill with NEW instructions- metFORMIN (GLUCOPHAGE) 500 MG tablet; 1 po bid  Dispense: 60 tablet; Refill: 0  2. Hypertension associated with diabetes ([place]) At goal. Brittany Vincent is working on healthy weight loss and exercise  to improve blood pressure control. We will watch for signs of hypotension as she continues her lifestyle modifications.  3. Vitamin D deficiency We will check Vit D level at next OV. Low Vitamin D level contributes to fatigue and are associated with obesity, breast, and colon cancer. She agrees to follow-up for routine testing of Vitamin D, at least 2-3 times per year to avoid over-replacement.  4. At risk for impaired metabolic function Due to  Brittany Vincent's current state of health and medical condition(s), she is at a significantly higher risk for impaired metabolic function.   At least 22 minutes was spent on counseling Brittany Vincent about these concerns today.  This places the patient at a much greater risk to subsequently develop cardio-pulmonary conditions that can negatively affect the patient's quality of life.  I stressed the importance of reversing these risks factors.  The initial goal is to lose at least 5-10% of starting weight to help reduce risk factors.  Counseling:  Intensive lifestyle modifications discussed with Brittany Vincent as the most appropriate first line treatment.  she will continue to work on diet, exercise, and weight loss efforts.  We will continue to reassess these conditions on a fairly regular basis in an attempt to decrease the patient's overall morbidity and mortality.  5. Obesity, with current BMI of 31.6  Brittany Vincent is currently in the action stage of change. As such, her goal is to continue with weight loss efforts. She has agreed to the Category 3 Plan.   Check Vit D level at next OV.  Exercise goals:  As is  Behavioral modification strategies: increasing lean protein intake, decreasing simple carbohydrates, and planning for success.  Brittany Vincent has agreed to follow-up with our clinic in 2-2.5 weeks. She was informed of the importance of frequent follow-up visits to maximize her success with intensive lifestyle modifications for her multiple health conditions.   Objective:   Blood pressure 116/74, pulse 72, temperature 98.2 F (36.8 C), height '5\' 7"'$  (1.702 m), weight 201 lb (91.2 kg), SpO2 96 %. Body mass index is 31.48 kg/m.  General: Cooperative, alert, well developed, in no acute distress. HEENT: Conjunctivae and lids unremarkable. Cardiovascular: Regular rhythm.  Lungs: Normal work of breathing. Neurologic: No focal deficits.   Lab Results  Component Value Date   CREATININE 0.82 12/20/2020   BUN 10 12/20/2020    NA 142 12/20/2020   K 4.5 12/20/2020   CL 102 12/20/2020   CO2 23 12/20/2020   Lab Results  Component Value Date   ALT 35 11/15/2020   AST 45 (H) 11/15/2020   ALKPHOS 60 11/15/2020   BILITOT 0.5 11/15/2020   Lab Results  Component Value Date   HGBA1C 7.2 (H) 11/15/2020   HGBA1C 6.4 (H) 04/12/2020   HGBA1C 7.2 (H) 11/09/2019   HGBA1C 6.4 (H) 03/19/2019   Lab Results  Component Value Date   INSULIN 20.3 01/11/2021   Lab Results  Component Value Date   TSH 1.33 11/15/2020   Lab Results  Component Value Date   CHOL 169 11/15/2020   HDL 42.60 11/15/2020   LDLCALC 85 12/22/2019   LDLDIRECT 100.0 11/15/2020   TRIG 271.0 (H) 11/15/2020   CHOLHDL 4 11/15/2020   No results found for: VD25OH Lab Results  Component Value Date   WBC 7.4 10/24/2020   HGB 15.0 10/24/2020   HCT 45.6 10/24/2020   MCV 99.8 10/24/2020   PLT 289 10/24/2020    Attestation Statements:   Reviewed by clinician on day of visit: allergies, medications,  problem list, medical history, surgical history, family history, social history, and previous encounter notes.  Coral Ceo, CMA, am acting as transcriptionist for [employer], DO.  I have reviewed the above documentation for accuracy and completeness, and I agree with the above. Marjory Sneddon, D.O.  The [place] was signed into law in 2016 which includes the topic of electronic health records.  This provides immediate access to information in MyChart.  This includes consultation notes, operative notes, office notes, lab results and pathology reports.  If you have any questions about what you read please let us know at your next visit so we can discuss your concerns and take corrective action if need be.  We are right here with you.

## 2021-02-02 ENCOUNTER — Other Ambulatory Visit: Payer: Self-pay | Admitting: Family Medicine

## 2021-02-06 ENCOUNTER — Telehealth: Payer: Self-pay | Admitting: *Deleted

## 2021-02-06 NOTE — Telephone Encounter (Signed)
Second attempt to contact patient to schedule 24 hour ambulatory blood pressure monitor. Please forward any return calls to [employer], [phone].

## 2021-02-07 ENCOUNTER — Ambulatory Visit (INDEPENDENT_AMBULATORY_CARE_PROVIDER_SITE_OTHER): Payer: No Typology Code available for payment source | Admitting: Family Medicine

## 2021-02-14 ENCOUNTER — Ambulatory Visit (INDEPENDENT_AMBULATORY_CARE_PROVIDER_SITE_OTHER): Payer: No Typology Code available for payment source | Admitting: Adult Health

## 2021-02-14 ENCOUNTER — Encounter (INDEPENDENT_AMBULATORY_CARE_PROVIDER_SITE_OTHER): Payer: Self-pay | Admitting: Family Medicine

## 2021-02-14 ENCOUNTER — Ambulatory Visit (INDEPENDENT_AMBULATORY_CARE_PROVIDER_SITE_OTHER): Payer: No Typology Code available for payment source | Admitting: Family Medicine

## 2021-02-14 ENCOUNTER — Other Ambulatory Visit: Payer: Self-pay

## 2021-02-14 VITALS — BP 116/74 | HR 63 | Temp 98.2°F | Ht 67.0 in | Wt 202.0 lb

## 2021-02-14 DIAGNOSIS — Z6831 Body mass index (BMI) 31.0-31.9, adult: Secondary | ICD-10-CM | POA: Diagnosis not present

## 2021-02-14 DIAGNOSIS — G4733 Obstructive sleep apnea (adult) (pediatric): Secondary | ICD-10-CM

## 2021-02-14 DIAGNOSIS — E669 Obesity, unspecified: Secondary | ICD-10-CM

## 2021-02-14 DIAGNOSIS — E1165 Type 2 diabetes mellitus with hyperglycemia: Secondary | ICD-10-CM | POA: Diagnosis not present

## 2021-02-15 ENCOUNTER — Encounter (INDEPENDENT_AMBULATORY_CARE_PROVIDER_SITE_OTHER): Payer: Self-pay | Admitting: Family Medicine

## 2021-02-15 DIAGNOSIS — G4733 Obstructive sleep apnea (adult) (pediatric): Secondary | ICD-10-CM | POA: Insufficient documentation

## 2021-02-15 DIAGNOSIS — E669 Obesity, unspecified: Secondary | ICD-10-CM | POA: Insufficient documentation

## 2021-02-15 HISTORY — DX: Obstructive sleep apnea (adult) (pediatric): G47.33

## 2021-02-15 NOTE — Progress Notes (Signed)
Chief Complaint:   OBESITY Brittany Vincent is here to discuss her progress with her obesity treatment plan along with follow-up of her obesity related diagnoses. Brittany Vincent is on the Category 3 Plan and states she is following her eating plan approximately 50% of the time. Brittany Vincent states she is doing 0 minutes 0 times per week.  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 She notes weight has increased over the past few years since she started her job. She notes she has recently been on vacation and been to a birthday celebration. She is on plan at breakfast but notes she has not had proper groceries recently. To adhere to plan at the other meals.  She will retire in 5 weeks and plans on concentrating on the plan more after she retires.   Subjective:   1. OSA (obstructive sleep apnea) Brittany Vincent has an upcoming fitting (calibration for CPAP in September). She notes fatigue during the day as well as headaches.  2. Type 2 diabetes mellitus with hyperglycemia, without long-term current use of insulin ([name]) Brittany Vincent's last A1C was elevated at 7.2. She does not check her CBG's regularly. She is on metformin 500 mg BID.  Lab Results  Component Value Date   HGBA1C 7.2 (H) 11/15/2020   HGBA1C 6.4 (H) 04/12/2020   HGBA1C 7.2 (H) 11/09/2019   Lab Results  Component Value Date   MICROALBUR 3.2 (H) 11/15/2020   LDLCALC 85 12/22/2019   CREATININE 0.82 12/20/2020   Lab Results  Component Value Date   INSULIN 20.3 01/11/2021    Assessment/Plan:   1. OSA (obstructive sleep apnea) Brittany Vincent will follow up with sleep medicine.   2. Type 2 diabetes mellitus with hyperglycemia, without long-term current use of insulin (Brittany Vincent) Brittany Vincent will continue her metformin.  3. Obesity, with current BMI of  31.63 Brittany Vincent is currently in the action stage of change. As such, her goal is to continue with weight loss efforts. She has agreed to the Category 3 Plan.   Exercise goals: No exercise has been prescribed at this time. Brittany Vincent plans on starting water aerobics after retirement.  Behavioral modification strategies: increasing lean protein intake and decreasing simple carbohydrates.  Brittany Vincent has agreed to follow-up with our clinic in 2 weeks.   Objective:   Blood pressure 116/74, pulse 63, temperature 98.2 F (36.8 C), height '5\' 7"'$  (1.702 m), weight 202 lb (91.6 kg), SpO2 97 %. Body mass index is 31.64 kg/m.  General: Cooperative, alert, well developed, in no acute distress. HEENT: Conjunctivae and lids unremarkable. Cardiovascular: Regular rhythm.  Lungs: Normal work of breathing. Neurologic: No focal deficits.   Lab Results  Component Value Date   CREATININE 0.82 12/20/2020   BUN 10 12/20/2020   NA 142 12/20/2020   K 4.5 12/20/2020   CL 102 12/20/2020   CO2 23 12/20/2020   Lab Results  Component Value Date   ALT 35 11/15/2020   AST 45 (H) 11/15/2020   ALKPHOS 60 11/15/2020   BILITOT 0.5 11/15/2020   Lab Results  Component Value Date   HGBA1C 7.2 (H) 11/15/2020   HGBA1C 6.4 (H) 04/12/2020   HGBA1C 7.2 (H) 11/09/2019   HGBA1C 6.4 (H) 03/19/2019   Lab Results  Component Value Date   INSULIN 20.3 01/11/2021   Lab Results  Component  Value Date   TSH 1.33 11/15/2020   Lab Results  Component Value Date   CHOL 169 11/15/2020   HDL 42.60 11/15/2020   LDLCALC 85 12/22/2019   LDLDIRECT 100.0 11/15/2020   TRIG 271.0 (H) 11/15/2020   CHOLHDL 4 11/15/2020   No results found for: VD25OH Lab Results  Component Value Date   WBC 7.4 10/24/2020   HGB 15.0 10/24/2020   HCT 45.6 10/24/2020   MCV 99.8 10/24/2020   PLT 289 10/24/2020   No results found for: IRON, TIBC, FERRITIN  Attestation Statements:   Reviewed by clinician on day of visit: allergies, medications,  problem list, medical history, surgical history, family history, social history, and previous encounter notes.  I, Lizbeth Bark, RMA, am acting as Location manager for [employer], [place].   I have reviewed the above documentation for accuracy and completeness, and I agree with the above. -  Georgianne Fick, FNP

## 2021-02-17 DIAGNOSIS — E1165 Type 2 diabetes mellitus with hyperglycemia: Secondary | ICD-10-CM

## 2021-02-17 HISTORY — DX: Type 2 diabetes mellitus with hyperglycemia: E11.65

## 2021-02-24 ENCOUNTER — Other Ambulatory Visit: Payer: Self-pay | Admitting: Family Medicine

## 2021-02-24 DIAGNOSIS — E785 Hyperlipidemia, unspecified: Secondary | ICD-10-CM

## 2021-02-24 DIAGNOSIS — F418 Other specified anxiety disorders: Secondary | ICD-10-CM

## 2021-02-28 ENCOUNTER — Encounter (HOSPITAL_BASED_OUTPATIENT_CLINIC_OR_DEPARTMENT_OTHER): Payer: Self-pay

## 2021-02-28 ENCOUNTER — Ambulatory Visit (INDEPENDENT_AMBULATORY_CARE_PROVIDER_SITE_OTHER): Payer: No Typology Code available for payment source | Admitting: Physician Assistant

## 2021-02-28 ENCOUNTER — Other Ambulatory Visit: Payer: Self-pay

## 2021-02-28 ENCOUNTER — Encounter (INDEPENDENT_AMBULATORY_CARE_PROVIDER_SITE_OTHER): Payer: Self-pay | Admitting: Physician Assistant

## 2021-02-28 VITALS — BP 100/65 | HR 62 | Temp 97.6°F | Ht 67.0 in | Wt 196.0 lb

## 2021-02-28 DIAGNOSIS — E119 Type 2 diabetes mellitus without complications: Secondary | ICD-10-CM | POA: Diagnosis not present

## 2021-02-28 DIAGNOSIS — Z9189 Other specified personal risk factors, not elsewhere classified: Secondary | ICD-10-CM | POA: Diagnosis not present

## 2021-02-28 DIAGNOSIS — Z6831 Body mass index (BMI) 31.0-31.9, adult: Secondary | ICD-10-CM

## 2021-02-28 DIAGNOSIS — E669 Obesity, unspecified: Secondary | ICD-10-CM

## 2021-02-28 MED ORDER — LOSARTAN POTASSIUM 25 MG PO TABS: 25.0000 mg | ORAL_TABLET | Freq: Every day | ORAL | 3 refills | Status: DC

## 2021-02-28 MED ORDER — METFORMIN HCL 500 MG PO TABS: ORAL_TABLET | ORAL | 0 refills | Status: DC

## 2021-02-28 MED ORDER — DILTIAZEM HCL ER COATED BEADS 180 MG PO CP24
180.0000 mg | ORAL_CAPSULE | Freq: Every day | ORAL | 0 refills | Status: DC
  Filled 2021-12-03: qty 90, 90d supply, fill #0

## 2021-02-28 NOTE — Progress Notes (Signed)
Chief Complaint:   OBESITY Brittany Vincent is here to discuss her progress with her obesity treatment plan along with follow-up of her obesity related diagnoses. Brittany Vincent is on the Category 3 Plan and states she is following her eating plan approximately 40% of the time. Brittany Vincent states she is not currently exercising.  Today's visit was #: 4 Starting weight: 203 lbs Starting date: 01/11/2021 Today's weight: 196 lbs Today's date: 02/28/2021 Total lbs lost to date: 7 Total lbs lost since last in-office visit: 6  Interim History: Brittany Vincent does very well with breakfast. She is not eating potatoes or rice. She sometimes will substitute soup or add protein shake if she is unable to eat lunch on plan.  Subjective:   1. Type 2 diabetes mellitus without complication, without long-term current use of insulin ([name]) Brittany Vincent's last A1c was 7.2. She is taking Metformin BID, which has decreased her appetite. Pt did not check blood sugars the past 2 weeks.  2. At risk for hypoglycemia [name] is at increased risk for hypoglycemia due to changes in diet, diagnosis of diabetes, and/or insulin use.   Assessment/Plan:   1. Type 2 diabetes mellitus without complication, without long-term current use of insulin ([name]) Good blood sugar control is important to decrease the likelihood of diabetic complications such as nephropathy, neuropathy, limb loss, blindness, coronary artery disease, and death. Intensive lifestyle modification including diet, exercise and weight loss are the first line of treatment for diabetes.   Refill- metFORMIN (GLUCOPHAGE) 500 MG tablet; 1 po bid  Dispense: 60 tablet; Refill: 0  2. At risk for hypoglycemia Brittany Vincent was given approximately 15 minutes of counseling today regarding prevention of hypoglycemia. She was advised of symptoms of hypoglycemia. Brittany Vincent was instructed to avoid skipping meals, eat regular protein rich meals and schedule low calorie snacks as needed.   Repetitive spaced  learning was employed today to elicit superior memory formation and behavioral change  3. Obesity, with current BMI of 30.69  Brittany Vincent is currently in the action stage of change. As such, her goal is to continue with weight loss efforts. She has agreed to the Category 3 Plan.   Additional breakfast options provided.  Exercise goals:  As is  Behavioral modification strategies: meal planning and cooking strategies and keeping healthy foods in the home.  Brittany Vincent has agreed to follow-up with our clinic in 2 weeks. She was informed of the importance of frequent follow-up visits to maximize her success with intensive lifestyle modifications for her multiple health conditions.   Objective:   Blood pressure 100/65, pulse 62, temperature 97.6 F (36.4 C), height '5\' 7"'$  (1.702 m), weight 196 lb (88.9 kg), SpO2 96 %. Body mass index is 30.7 kg/m.  General: Cooperative, alert, well developed, in no acute distress. HEENT: Conjunctivae and lids unremarkable. Cardiovascular: Regular rhythm.  Lungs: Normal work of breathing. Neurologic: No focal deficits.   Lab Results  Component Value Date   CREATININE 0.82 12/20/2020   BUN 10 12/20/2020   NA 142 12/20/2020   K 4.5 12/20/2020   CL 102 12/20/2020   CO2 23 12/20/2020   Lab Results  Component Value Date   ALT 35 11/15/2020   AST 45 (H) 11/15/2020   ALKPHOS 60 11/15/2020   BILITOT 0.5 11/15/2020   Lab Results  Component Value Date   HGBA1C 7.2 (H) 11/15/2020   HGBA1C 6.4 (H) 04/12/2020   HGBA1C 7.2 (H) 11/09/2019   HGBA1C 6.4 (H) 03/19/2019   Lab Results  Component Value Date  INSULIN 20.3 01/11/2021   Lab Results  Component Value Date   TSH 1.33 11/15/2020   Lab Results  Component Value Date   CHOL 169 11/15/2020   HDL 42.60 11/15/2020   LDLCALC 85 12/22/2019   LDLDIRECT 100.0 11/15/2020   TRIG 271.0 (H) 11/15/2020   CHOLHDL 4 11/15/2020   No results found for: VD25OH Lab Results  Component Value Date   WBC 7.4  10/24/2020   HGB 15.0 10/24/2020   HCT 45.6 10/24/2020   MCV 99.8 10/24/2020   PLT 289 10/24/2020    Attestation Statements:   Reviewed by clinician on day of visit: allergies, medications, problem list, medical history, surgical history, family history, social history, and previous encounter notes.  Coral Ceo, CMA, am acting as transcriptionist for [employer], PA-C.  I have reviewed the above documentation for accuracy and completeness, and I agree with the above. Abby Potash, PA-C

## 2021-03-02 ENCOUNTER — Ambulatory Visit (HOSPITAL_BASED_OUTPATIENT_CLINIC_OR_DEPARTMENT_OTHER): Payer: No Typology Code available for payment source | Attending: Cardiology | Admitting: Cardiology

## 2021-03-02 ENCOUNTER — Other Ambulatory Visit: Payer: Self-pay

## 2021-03-02 VITALS — Ht 67.0 in | Wt 196.0 lb

## 2021-03-02 DIAGNOSIS — G4733 Obstructive sleep apnea (adult) (pediatric): Secondary | ICD-10-CM | POA: Diagnosis not present

## 2021-03-04 NOTE — Procedures (Signed)
   Patient Name: Brittany Vincent, Brittany Vincent Date: 03/02/2021 Gender: Female D.O.B: 04/25/1956 Age (years): 60 Referring Provider: Fransico Him MD, ABSM Height (inches): 67 Interpreting Physician: Fransico Him MD, ABSM Weight (lbs): 197 RPSGT: Jorge Ny BMI: 31 MRN: [id number] Neck Size: 16.50  CLINICAL INFORMATION The patient is referred for a CPAP titration to treat sleep apnea.  SLEEP STUDY TECHNIQUE As per the AASM Manual for the Scoring of Sleep and Associated Events v2.3 (April 2016) with a hypopnea requiring 4% desaturations.  The channels recorded and monitored were frontal, central and occipital EEG, electrooculogram (EOG), submentalis EMG (chin), nasal and oral airflow, thoracic and abdominal wall motion, anterior tibialis EMG, snore microphone, electrocardiogram, and pulse oximetry. Continuous positive airway pressure (CPAP) was initiated at the beginning of the study and titrated to treat sleep-disordered breathing.  MEDICATIONS Medications self-administered by patient taken the night of the study : N/A  TECHNICIAN COMMENTS Comments added by technician: Patient had difficulty initiating sleep. Comments added by scorer: N/A  RESPIRATORY PARAMETERS Optimal PAP Pressure (cm): 15  AHI at Optimal Pressure (/hr):1.2 Overall Minimal O2 (%):88.0  Supine % at Optimal Pressure (%):0 Minimal O2 at Optimal Pressure (%): 92.0   SLEEP ARCHITECTURE The study was initiated at 10:26:49 PM and ended at 4:51:57 AM.  Sleep onset time was 47.0 minutes and the sleep efficiency was 53.1%. The total sleep time was 204.5 minutes.  The patient spent 7.3% of the night in stage N1 sleep, 92.7% in stage N2 sleep, 0.0% in stage N3 and 0% in REM.Stage REM latency was N/A minutes  Wake after sleep onset was 133.6. Alpha intrusion was absent. Supine sleep was 25.18%.  CARDIAC DATA The 2 lead EKG demonstrated sinus rhythm. The mean heart rate was 56.3 beats per minute. Other EKG findings  include: PVCs.  LEG MOVEMENT DATA The total Periodic Limb Movements of Sleep (PLMS) were 0. The PLMS index was 0.0. A PLMS index of <15 is considered normal in adults.  IMPRESSIONS - The optimal PAP pressure was 15 cm of water. - Mild oxygen desaturations were observed during this titration (min O2 = 88.0%). - The patient snored with soft snoring volume during this titration study. - 2-lead EKG demonstrated: PVCs - Clinically significant periodic limb movements were not noted during this study. Arousals associated with PLMs were rare.  DIAGNOSIS - Obstructive Sleep Apnea (G47.33)  RECOMMENDATIONS - Trial of CPAP therapy on 15 cm H2O with a Medium size Resmed Full Face Mask AirFit F20 mask and heated humidification. - Avoid alcohol, sedatives and other CNS depressants that may worsen sleep apnea and disrupt normal sleep architecture. - Sleep hygiene should be reviewed to assess factors that may improve sleep quality. - Weight management and regular exercise should be initiated or continued. - Return to [facility] for re-evaluation after 4 weeks of therapy  [Electronically signed] 03/04/2021 09:04 PM  Fransico Him MD, ABSM Diplomate, American Board of Sleep Medicine

## 2021-03-12 ENCOUNTER — Telehealth: Payer: Self-pay | Admitting: *Deleted

## 2021-03-12 NOTE — Telephone Encounter (Signed)
-----   Message from Sueanne Margarita, MD sent at 03/04/2021  9:07 PM EDT ----- Please let patient know that they had a successful PAP titration and let DME know that orders are in EPIC.  Please set up 6 week OV with me.

## 2021-03-12 NOTE — Telephone Encounter (Signed)
The patient has been notified of the result and verbalized understanding.  All questions (if any) were answered. Brittany Vincent, [name] 03/12/2021 2:21 PM    Upon patient request DME selection is [facility] Patient understands he will be contacted by [facility] to set up his cpap. Patient understands to call if [facility] does not contact him with new setup in a timely manner. Patient understands they will be called once confirmation has been received from [facility] that they have received their new machine to schedule 10 week follow up appointment.   [facility] notified of new cpap order  Please add to airview Patient was grateful for the call and thanked me.

## 2021-03-21 ENCOUNTER — Encounter (INDEPENDENT_AMBULATORY_CARE_PROVIDER_SITE_OTHER): Payer: Self-pay | Admitting: Physician Assistant

## 2021-03-21 ENCOUNTER — Other Ambulatory Visit: Payer: Self-pay

## 2021-03-21 ENCOUNTER — Ambulatory Visit (INDEPENDENT_AMBULATORY_CARE_PROVIDER_SITE_OTHER): Payer: No Typology Code available for payment source | Admitting: Physician Assistant

## 2021-03-21 VITALS — BP 108/74 | HR 74 | Temp 98.0°F | Ht 67.0 in | Wt 195.0 lb

## 2021-03-21 DIAGNOSIS — E669 Obesity, unspecified: Secondary | ICD-10-CM | POA: Diagnosis not present

## 2021-03-21 DIAGNOSIS — Z9189 Other specified personal risk factors, not elsewhere classified: Secondary | ICD-10-CM

## 2021-03-21 DIAGNOSIS — E119 Type 2 diabetes mellitus without complications: Secondary | ICD-10-CM

## 2021-03-21 DIAGNOSIS — Z6831 Body mass index (BMI) 31.0-31.9, adult: Secondary | ICD-10-CM | POA: Diagnosis not present

## 2021-03-21 DIAGNOSIS — G4733 Obstructive sleep apnea (adult) (pediatric): Secondary | ICD-10-CM

## 2021-03-21 MED ORDER — METFORMIN HCL 500 MG PO TABS: ORAL_TABLET | ORAL | 0 refills | Status: DC

## 2021-03-21 NOTE — Progress Notes (Signed)
Chief Complaint:   OBESITY Brittany Vincent is here to discuss her progress with her obesity treatment plan along with follow-up of her obesity related diagnoses. Brittany Vincent is on the Category 3 Plan and states she is following her eating plan approximately 50% of the time. Brittany Vincent states she is doing 0 minutes 0 times per week.  Today's visit was #: 5 Starting weight: 203 lbs Starting date: 01/11/2021 Today's weight: 195 lbs Today's date: 03/21/2021 Total lbs lost to date: 8 Total lbs lost since last in-office visit: 1  Interim History: [name] is drinking a protein shake often for lunch. She is retiring in 2 days and will have more time to meal plan and exercise after that. She has not yet received her CPAP machine.  Subjective:   1. Type 2 diabetes mellitus without complication, without long-term current use of insulin ([name]) Brittany Vincent's last A1c was 7.2. She is on metformin BID, which has decreased her appetite.  2. OSA (obstructive sleep apnea) Velvia has not yet received her CPAP machine. She is not getting good quality sleep.  3. At risk for impaired metabolic function Brittany Vincent is at increased risk for impaired metabolic function due to not having CPAP machine as of yet.  Assessment/Plan:   1. Type 2 diabetes mellitus without complication, without long-term current use of insulin ([place]) Sarabeth will continue metformin, and we will refill for 1 month. Good blood sugar control is important to decrease the likelihood of diabetic complications such as nephropathy, neuropathy, limb loss, blindness, coronary artery disease, and death. Intensive lifestyle modification including diet, exercise and weight loss are the first line of treatment for diabetes.   - metFORMIN (GLUCOPHAGE) 500 MG tablet; 1 po BID  Dispense: 60 tablet; Refill: 0  2. OSA (obstructive sleep apnea) Intensive lifestyle modifications are the first line treatment for this issue. We discussed several lifestyle modifications today.  Shakiah will start using her CPAP machine when she receives it. She will continue to work on diet, exercise and weight loss efforts. We will continue to monitor. Orders and follow up as documented in patient record.   3. At risk for impaired metabolic function Brittany Vincent was given approximately 15 minutes of impaired  metabolic function prevention counseling today. We discussed intensive lifestyle modifications today with an emphasis on specific nutrition and exercise instructions and strategies.   Repetitive spaced learning was employed today to elicit superior memory formation and behavioral change.  4. Obesity, with current BMI of 30.53 Brittany Vincent is currently in the action stage of change. As such, her goal is to continue with weight loss efforts. She has agreed to the Category 3 Plan.   Exercise goals: No exercise has been prescribed at this time.  Behavioral modification strategies: no skipping meals and meal planning and cooking strategies.  Brittany Vincent has agreed to follow-up with our clinic in 3 weeks. She was informed of the importance of frequent follow-up visits to maximize her success with intensive lifestyle modifications for her multiple health conditions.   Objective:   Blood pressure 108/74, pulse 74, temperature 98 F (36.7 C), height 5\' 7"  (1.702 m), weight 195 lb (88.5 kg), SpO2 97 %. Body mass index is 30.54 kg/m.  General: Cooperative, alert, well developed, in no acute distress. HEENT: Conjunctivae and lids unremarkable. Cardiovascular: Regular rhythm.  Lungs: Normal work of breathing. Neurologic: No focal deficits.   Lab Results  Component Value Date   CREATININE 0.82 12/20/2020   BUN 10 12/20/2020   NA 142 12/20/2020   K  4.5 12/20/2020   CL 102 12/20/2020   CO2 23 12/20/2020   Lab Results  Component Value Date   ALT 35 11/15/2020   AST 45 (H) 11/15/2020   ALKPHOS 60 11/15/2020   BILITOT 0.5 11/15/2020   Lab Results  Component Value Date   HGBA1C 7.2 (H)  11/15/2020   HGBA1C 6.4 (H) 04/12/2020   HGBA1C 7.2 (H) 11/09/2019   HGBA1C 6.4 (H) 03/19/2019   Lab Results  Component Value Date   INSULIN 20.3 01/11/2021   Lab Results  Component Value Date   TSH 1.33 11/15/2020   Lab Results  Component Value Date   CHOL 169 11/15/2020   HDL 42.60 11/15/2020   LDLCALC 85 12/22/2019   LDLDIRECT 100.0 11/15/2020   TRIG 271.0 (H) 11/15/2020   CHOLHDL 4 11/15/2020   No results found for: VD25OH Lab Results  Component Value Date   WBC 7.4 10/24/2020   HGB 15.0 10/24/2020   HCT 45.6 10/24/2020   MCV 99.8 10/24/2020   PLT 289 10/24/2020   No results found for: IRON, TIBC, FERRITIN  Attestation Statements:   Reviewed by clinician on day of visit: allergies, medications, problem list, medical history, surgical history, family history, social history, and previous encounter notes.   Wilhemena Durie, am acting as transcriptionist for [employer], PA-C.  I have reviewed the above documentation for accuracy and completeness, and I agree with the above. Abby Potash, PA-C

## 2021-04-10 ENCOUNTER — Ambulatory Visit (INDEPENDENT_AMBULATORY_CARE_PROVIDER_SITE_OTHER): Payer: No Typology Code available for payment source | Admitting: Physician Assistant

## 2021-04-11 ENCOUNTER — Other Ambulatory Visit: Payer: Self-pay

## 2021-04-11 ENCOUNTER — Encounter (INDEPENDENT_AMBULATORY_CARE_PROVIDER_SITE_OTHER): Payer: Self-pay | Admitting: Adult Health

## 2021-04-11 ENCOUNTER — Ambulatory Visit (INDEPENDENT_AMBULATORY_CARE_PROVIDER_SITE_OTHER): Payer: PPO | Admitting: Adult Health

## 2021-04-11 VITALS — BP 123/72 | HR 74 | Temp 98.7°F | Ht 67.0 in | Wt 194.0 lb

## 2021-04-11 DIAGNOSIS — Z6831 Body mass index (BMI) 31.0-31.9, adult: Secondary | ICD-10-CM

## 2021-04-11 DIAGNOSIS — E119 Type 2 diabetes mellitus without complications: Secondary | ICD-10-CM | POA: Diagnosis not present

## 2021-04-11 DIAGNOSIS — E669 Obesity, unspecified: Secondary | ICD-10-CM

## 2021-04-11 NOTE — Progress Notes (Signed)
Chief Complaint:   OBESITY Brittany Vincent is here to discuss her progress with her obesity treatment plan along with follow-up of her obesity related diagnoses. Brittany Vincent is on the Category 3 Plan and states she is following her eating plan approximately 70-75% of the time. Brittany Vincent states she is not exercising regularly at this time.  Today's visit was #: 6 Starting weight: 203 lbs Starting date: 01/11/2021 Today's weight: 194 lbs Today's date: 04/11/2021 Total lbs lost to date: 9 lbs Total lbs lost since last in-office visit: 1 lb  Interim History: On 01/25/2021, Brittany Vincent's metformin was increased from 500 mg daily to 500 mg BID - denies GI upset. 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  Subjective:   1. Type 2 diabetes mellitus without complication, without long-term current use of insulin ([name]) BG has been elevated at last 3 checks. 11/15/20-A1c 7.2.   On 01/25/2021, Brittany Vincent's metformin was increased from 500 mg daily to 500 mg BID - denies GI upset.  Assessment/Plan:   1. Type 2 diabetes mellitus without complication, without long-term current use of insulin ([name]) Check BG 1-2 times per week. Check fasting labs at next office visit. Continue metformin 500 mg BID.  2. Obesity, with current BMI of 30.5  Brittany Vincent is currently in the action stage of change. As such, her goal is to continue with weight loss efforts. She has agreed to the Category 3 Plan.   Check fasting labs at next office visit.  Handout - Protein Content of Food.  High Protein/Low CHO Food List   Exercise goals:  As is.  Behavioral modification strategies: increasing lean protein intake, decreasing simple carbohydrates, meal planning and cooking strategies, keeping healthy foods in the home, and planning for success.  Brittany Vincent has agreed to follow-up with our clinic in 2 weeks, fasting. She was informed of the importance of frequent follow-up visits to maximize her success with intensive lifestyle modifications for  her multiple health conditions.   Objective:   Blood pressure 123/72, pulse 74, temperature 98.7 F (37.1 C), height 5\' 7"  (1.702 m), weight 194 lb (88 kg), SpO2 97 %. Body mass index is 30.38 kg/m.  General: Cooperative, alert, well developed, in no acute distress. HEENT: Conjunctivae and lids unremarkable. Cardiovascular: Regular rhythm.  Lungs: Normal work of breathing. Neurologic: No focal deficits.   Lab Results  Component Value Date   CREATININE 0.82 12/20/2020   BUN 10 12/20/2020   NA 142 12/20/2020   K 4.5 12/20/2020   CL 102 12/20/2020   CO2 23 12/20/2020   Lab Results  Component Value Date   ALT 35 11/15/2020   AST 45 (H) 11/15/2020   ALKPHOS 60 11/15/2020   BILITOT 0.5 11/15/2020   Lab Results  Component Value Date   HGBA1C 7.2 (H) 11/15/2020   HGBA1C 6.4 (H) 04/12/2020   HGBA1C 7.2 (H) 11/09/2019   HGBA1C 6.4 (H) 03/19/2019   Lab Results  Component Value Date   INSULIN 20.3 01/11/2021   Lab Results  Component Value Date   TSH 1.33 11/15/2020   Lab Results  Component Value Date   CHOL 169 11/15/2020   HDL 42.60 11/15/2020   LDLCALC 85 12/22/2019   LDLDIRECT 100.0 11/15/2020   TRIG 271.0 (H) 11/15/2020   CHOLHDL 4 11/15/2020   Lab Results  Component Value Date   WBC 7.4 10/24/2020   HGB 15.0 10/24/2020   HCT 45.6 10/24/2020   MCV 99.8 10/24/2020   PLT 289 10/24/2020   Attestation Statements:  Reviewed by clinician on day of visit: allergies, medications, problem list, medical history, surgical history, family history, social history, and previous encounter notes.  Time spent on visit including pre-visit chart review and post-visit care and charting was 30 minutes.   I, Water quality scientist, CMA, am acting as Location manager for Mina Marble, NP.  I have reviewed the above documentation for accuracy and completeness, and I agree with the above. -  Colon Rueth d. Julieanne Hadsall, NP-C

## 2021-05-01 ENCOUNTER — Ambulatory Visit (INDEPENDENT_AMBULATORY_CARE_PROVIDER_SITE_OTHER): Payer: PPO | Admitting: Adult Health

## 2021-05-03 ENCOUNTER — Ambulatory Visit (INDEPENDENT_AMBULATORY_CARE_PROVIDER_SITE_OTHER): Payer: PPO | Admitting: Adult Health

## 2021-05-07 ENCOUNTER — Ambulatory Visit (INDEPENDENT_AMBULATORY_CARE_PROVIDER_SITE_OTHER): Payer: PPO | Admitting: Adult Health

## 2021-05-08 ENCOUNTER — Encounter (INDEPENDENT_AMBULATORY_CARE_PROVIDER_SITE_OTHER): Payer: Self-pay

## 2021-05-10 ENCOUNTER — Other Ambulatory Visit: Payer: Self-pay | Admitting: Family Medicine

## 2021-05-10 DIAGNOSIS — F418 Other specified anxiety disorders: Secondary | ICD-10-CM

## 2021-05-10 DIAGNOSIS — E785 Hyperlipidemia, unspecified: Secondary | ICD-10-CM

## 2021-05-13 ENCOUNTER — Other Ambulatory Visit: Payer: Self-pay | Admitting: Family Medicine

## 2021-05-13 DIAGNOSIS — E039 Hypothyroidism, unspecified: Secondary | ICD-10-CM

## 2021-05-22 ENCOUNTER — Encounter (INDEPENDENT_AMBULATORY_CARE_PROVIDER_SITE_OTHER): Payer: No Typology Code available for payment source | Admitting: Ophthalmology

## 2021-05-23 ENCOUNTER — Encounter (INDEPENDENT_AMBULATORY_CARE_PROVIDER_SITE_OTHER): Payer: No Typology Code available for payment source | Admitting: Ophthalmology

## 2021-05-23 ENCOUNTER — Ambulatory Visit: Payer: Self-pay | Admitting: Family Medicine

## 2021-05-23 DIAGNOSIS — E782 Mixed hyperlipidemia: Secondary | ICD-10-CM

## 2021-05-23 DIAGNOSIS — I152 Hypertension secondary to endocrine disorders: Secondary | ICD-10-CM

## 2021-05-23 DIAGNOSIS — E559 Vitamin D deficiency, unspecified: Secondary | ICD-10-CM

## 2021-05-23 DIAGNOSIS — E119 Type 2 diabetes mellitus without complications: Secondary | ICD-10-CM

## 2021-05-23 DIAGNOSIS — E039 Hypothyroidism, unspecified: Secondary | ICD-10-CM

## 2021-05-24 ENCOUNTER — Encounter (INDEPENDENT_AMBULATORY_CARE_PROVIDER_SITE_OTHER): Payer: No Typology Code available for payment source | Admitting: Ophthalmology

## 2021-05-29 ENCOUNTER — Other Ambulatory Visit: Payer: Self-pay | Admitting: Family Medicine

## 2021-05-29 DIAGNOSIS — E119 Type 2 diabetes mellitus without complications: Secondary | ICD-10-CM

## 2021-05-30 ENCOUNTER — Encounter (INDEPENDENT_AMBULATORY_CARE_PROVIDER_SITE_OTHER): Payer: PPO | Admitting: Ophthalmology

## 2021-05-30 ENCOUNTER — Other Ambulatory Visit: Payer: Self-pay

## 2021-05-30 DIAGNOSIS — H33301 Unspecified retinal break, right eye: Secondary | ICD-10-CM

## 2021-05-30 DIAGNOSIS — H35033 Hypertensive retinopathy, bilateral: Secondary | ICD-10-CM | POA: Diagnosis not present

## 2021-05-30 DIAGNOSIS — H43813 Vitreous degeneration, bilateral: Secondary | ICD-10-CM | POA: Diagnosis not present

## 2021-05-30 DIAGNOSIS — I10 Essential (primary) hypertension: Secondary | ICD-10-CM | POA: Diagnosis not present

## 2021-06-13 ENCOUNTER — Other Ambulatory Visit: Payer: Self-pay | Admitting: Family Medicine

## 2021-06-13 DIAGNOSIS — E039 Hypothyroidism, unspecified: Secondary | ICD-10-CM

## 2021-07-05 ENCOUNTER — Ambulatory Visit (INDEPENDENT_AMBULATORY_CARE_PROVIDER_SITE_OTHER): Payer: PPO | Admitting: Family Medicine

## 2021-07-05 ENCOUNTER — Encounter: Payer: Self-pay | Admitting: Family Medicine

## 2021-07-05 VITALS — BP 118/72 | HR 60 | Temp 98.0°F | Resp 17 | Ht 67.0 in | Wt 193.8 lb

## 2021-07-05 DIAGNOSIS — Z1211 Encounter for screening for malignant neoplasm of colon: Secondary | ICD-10-CM

## 2021-07-05 DIAGNOSIS — G4733 Obstructive sleep apnea (adult) (pediatric): Secondary | ICD-10-CM

## 2021-07-05 DIAGNOSIS — E1165 Type 2 diabetes mellitus with hyperglycemia: Secondary | ICD-10-CM

## 2021-07-05 DIAGNOSIS — I152 Hypertension secondary to endocrine disorders: Secondary | ICD-10-CM

## 2021-07-05 DIAGNOSIS — E673 Hypervitaminosis D: Secondary | ICD-10-CM

## 2021-07-05 DIAGNOSIS — E785 Hyperlipidemia, unspecified: Secondary | ICD-10-CM

## 2021-07-05 DIAGNOSIS — Z23 Encounter for immunization: Secondary | ICD-10-CM

## 2021-07-05 DIAGNOSIS — Z9989 Dependence on other enabling machines and devices: Secondary | ICD-10-CM

## 2021-07-05 DIAGNOSIS — E1159 Type 2 diabetes mellitus with other circulatory complications: Secondary | ICD-10-CM | POA: Diagnosis not present

## 2021-07-05 DIAGNOSIS — Z800 Family history of malignant neoplasm of digestive organs: Secondary | ICD-10-CM | POA: Diagnosis not present

## 2021-07-05 NOTE — Patient Instructions (Signed)
Lab visit tomorrow. No med changes for now. Thanks for coming in today.

## 2021-07-05 NOTE — Progress Notes (Signed)
Subjective:  Patient ID: Brittany Vincent, female    DOB: 1956-06-12  Age: 66 y.o. MRN: [id number]  CC:  Chief Complaint  Patient presents with   Diabetes    Pt reports good /better BG readings, no concerns   Referral    Pt reports due for colonoscopy Brittany Vincent, would like referral sent    Hyperlipidemia    Pt here for recheck no concerns today feels well     HPI Brittany Vincent presents for   Diabetes: Without complication.  Has also been followed by healthy weight and wellness, Brittany Vincent, Brittany Vincent, last visit October 19. Metformin 500 mg twice daily She is on ARB, statin. Microalbumin: 3.2 with normal ratio 11/15/2020 Home readings - 98-121. Fasting.  Optho, foot exam, pneumovax:  Ophthalmology - Brittany. Brittany Vincent - July 2022. Retinal tear. Laser by Brittany. Brittany Vincent doing well.  Pneumovax: in [place]- not in past 10 years.   Requests vit D level - in MVI and caltrate and 2000iu per day.  Too high in April 2022 - has cut back on intake.   Last vitamin D No results found for: Davy Pique, VD25OH   Immunization History  Administered Date(s) Administered   Influenza,inj,Quad PF,6+ Mos 03/25/2019, 04/12/2020   Influenza-Unspecified 06/05/2021   PFIZER(Purple Top)SARS-COV-2 Vaccination 08/04/2019, 08/25/2019, 04/03/2020, 10/01/2020   PNEUMOCOCCAL CONJUGATE-20 07/05/2021  Shingrix recommended.    Lab Results  Component Value Date   HGBA1C 7.2 (H) 11/15/2020   HGBA1C 6.4 (H) 04/12/2020   HGBA1C 7.2 (H) 11/09/2019   Lab Results  Component Value Date   MICROALBUR 3.2 (H) 11/15/2020   LDLCALC 85 12/22/2019   CREATININE 0.82 12/20/2020    Hypothyroidism: Lab Results  Component Value Date   TSH 1.33 11/15/2020  Taking medication daily. Syntroid 173mg No new hot or cold intolerance. No new hair or skin changes, heart palpitations or new fatigue. No new weight changes.   Hyperlipidemia: Crestor 424mqd. No new myalgias/side effects. Nat fasting - will return for  labs.  Lab Results  Component Value Date   CHOL 169 11/15/2020   HDL 42.60 11/15/2020   LDLCALC 85 12/22/2019   LDLDIRECT 100.0 11/15/2020   TRIG 271.0 (H) 11/15/2020   CHOLHDL 4 11/15/2020   Lab Results  Component Value Date   ALT 35 11/15/2020   AST 45 (H) 11/15/2020   ALKPHOS 60 11/15/2020   BILITOT 0.5 11/15/2020   Hypertension: Diltiazem, losartan. Hx of RBBB, palpitations. No new side effects.  Cardiology - Brittany Vincent.  History of obstructive sleep apnea, CPAP recommended last year- started end of November. Feels well rested, sleeping better - nightly use.  Home readings: none.  BP Readings from Last 3 Encounters:  07/05/21 118/72  04/11/21 123/72  03/21/21 108/74   Lab Results  Component Value Date   CREATININE 0.82 12/20/2020   Personal hx breast CA, FH of colon CA Last colonoscopy in 2017 in [place], 1 benign polyp   History Patient Active Problem List   Diagnosis Date Noted   Type 2 diabetes mellitus with hyperglycemia, without long-term current use of insulin ([place]08/27/2022   OSA (obstructive sleep apnea) 02/15/2021   Class 1 obesity with serious comorbidity and body mass index (BMI) of 31.0 to 31.9 in adult 02/15/2021   Genetic testing 12/22/2020   Family history of malignant neoplasm of ovary 12/13/2020   Family history of malignant neoplasm of digestive organ 12/13/2020   Family history of melanoma 12/13/2020   Family history of uterine cancer 12/13/2020  Personal history of malignant neoplasm of breast 05/01/2016   Hypothyroidism 05/01/2016   Hyperlipidemia 05/01/2016   Breast cancer ([place]) 2013   Past Medical History:  Diagnosis Date   Breast cancer ([place]) 2013   Depression    Family history of malignant neoplasm of digestive organ    Family history of malignant neoplasm of ovary    Family history of melanoma    Family history of uterine cancer    Fatty liver    Hyperlipidemia    Hypertension    Infertility, female    Lactose  intolerance    Obese    Personal history of malignant neoplasm of breast    Pre-diabetes    Shortness of breath on exertion    Sleep apnea    Thyroid disease    Past Surgical History:  Procedure Laterality Date   BREAST SURGERY     BUNIONECTOMY Right    HERNIA REPAIR     REFRACTIVE SURGERY     Allergies  Allergen Reactions   No Known Allergies    Prior to Admission medications   Medication Sig Start Date End Date Taking? Authorizing Provider  blood glucose meter kit and supplies Dispense based on patient and insurance preference. Use up to four times daily as directed. (FOR ICD-10 E10.9, E11.9). 11/15/19  Yes Brittany Agreste, MD  diltiazem (CARDIZEM CD) 180 MG 24 hr capsule Take 1 capsule (180 mg total) by mouth daily. 02/28/21  Yes Brittany Margarita, MD  escitalopram (LEXAPRO) 20 MG tablet TAKE 1 TABLET BY MOUTH EVERY DAY 05/11/21  Yes Brittany Agreste, MD  letrozole [facility]) 2.5 MG tablet TAKE 1 TABLET BY MOUTH EVERY DAY 06/13/21  Yes Brittany Agreste, MD  levothyroxine (SYNTHROID) 137 MCG tablet TAKE 1 TABLET BY MOUTH EVERY DAY BEFORE BREAKFAST 06/13/21  Yes Brittany Agreste, MD  losartan (COZAAR) 25 MG tablet Take 1 tablet (25 mg total) by mouth daily. 02/28/21  Yes Brittany Margarita, MD  metFORMIN (GLUCOPHAGE) 500 MG tablet TAKE 1 TABLET BY MOUTH EVERY DAY WITH BREAKFAST 05/29/21  Yes Brittany Agreste, MD  rosuvastatin (CRESTOR) 40 MG tablet TAKE 1 TABLET BY MOUTH EVERY DAY 05/11/21  Yes Brittany Agreste, MD   Social History   Socioeconomic History   Marital status: Married    Spouse name: Not on file   Number of children: 3   Years of education: Not on file   Highest education level: Not on file  Occupational History   Occupation: RN   Occupation: Telephonic nursing  Tobacco Use   Smoking status: Never   Smokeless tobacco: Never  Vaping Use   Vaping Use: Never used  Substance and Sexual Activity   Alcohol use: No   Drug use: No   Sexual activity: Not on file   Other Topics Concern   Not on file  Social History Narrative   Lives with her husband.   Older son lives in [place], [place] with his wife and their 2 children.   Younger Nature conservation officer) son lives in [place], with his wife Brittany Vincent) and their daughter, Brittany Vincent.   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   Daughter recently moved to [place].   Moved to [place] from [place], [place] to be nearer to [place], [place] and Brittany Vincent.   Social Determinants of Health   Financial Resource Strain: Not on file  Food Insecurity: Not on file  Transportation Needs: Not on file  Physical Activity: Not on file  Stress: Not on file  Social Connections: Not on file  Intimate Partner Violence: Not on file    Review of Systems  Constitutional:  Negative for fatigue and unexpected weight change.  Respiratory:  Negative for chest tightness and shortness of breath.   Cardiovascular:  Negative for chest pain, palpitations and leg swelling.  Gastrointestinal:  Negative for abdominal pain and blood in stool.  Neurological:  Negative for dizziness, syncope, light-headedness and headaches.    Objective:   Vitals:   07/05/21 1438  BP: 118/72  Pulse: 60  Resp: 17  Temp: 98 F (36.7 C)  TempSrc: Temporal  SpO2: 95%  Weight: 193 lb 12.8 oz (87.9 kg)  Height: 5' 7"  (1.702 m)     Physical Exam Vitals reviewed.  Constitutional:      Appearance: Normal appearance. She is well-developed.  HENT:     Head: Normocephalic and atraumatic.  Eyes:     Conjunctiva/sclera: Conjunctivae normal.     Pupils: Pupils are equal, round, and reactive to light.  Neck:     Vascular: No carotid bruit.  Cardiovascular:     Rate and Rhythm: Normal rate and regular rhythm.     Heart sounds: Normal heart sounds.  Pulmonary:     Effort: Pulmonary effort is normal.     Breath sounds: Normal breath sounds.  Abdominal:     Palpations: Abdomen is soft. There is no pulsatile mass.     Tenderness: There is no abdominal  tenderness.  Musculoskeletal:     Right lower leg: No edema.     Left lower leg: No edema.  Skin:    General: Skin is warm and dry.  Neurological:     Mental Status: She is alert and oriented to person, place, and time.  Psychiatric:        Mood and Affect: Mood normal.        Behavior: Behavior normal.       Assessment & Plan:  Margrette Wynia is a 66 y.o. female . Type 2 diabetes mellitus with hyperglycemia, without long-term current use of insulin ([name]) - Plan: Comprehensive metabolic panel, Hemoglobin A1c, CANCELED: Comprehensive metabolic panel, CANCELED: Hemoglobin A1c  -Check labs at future lab visit for fasting labs, continue same regimen.  66-monthfollow-up.  Hyperlipidemia, unspecified hyperlipidemia type - Plan: Comprehensive metabolic panel, Lipid panel, CANCELED: Comprehensive metabolic panel, CANCELED: Lipid panel  -Tolerating statin current dose, check labs.  Continue same.  OSA on CPAP  -Improved sleep, denies daytime somnolence.  Tolerating CPAP.  Continue same  Hypertension associated with diabetes ([place] As above, no changes.  Need for pneumococcal vaccination - Plan: Pneumococcal conjugate vaccine 20-valent (Prevnar 20)  High vitamin D level - Plan: Vitamin D (25 hydroxy)  -Check vitamin D with prior elevated readings.  On supplementation as above.  May need to decrease further.  Screening for malignant neoplasm of colon - Plan: Ambulatory referral to Gastroenterology Family history of colon cancer - Plan: Ambulatory referral to Gastroenterology  -Refer to gastroenterology as possible 5-year repeat given family history of colon cancer.  No orders of the defined types were placed in this encounter.  Patient Instructions  Lab visit tomorrow. No med changes for now. Thanks for coming in today.     Signed,   JMerri Ray MD [facility] 07/05/21 7:06 PM

## 2021-07-06 ENCOUNTER — Other Ambulatory Visit (INDEPENDENT_AMBULATORY_CARE_PROVIDER_SITE_OTHER): Payer: Self-pay | Admitting: Physician Assistant

## 2021-07-06 ENCOUNTER — Other Ambulatory Visit: Payer: PPO

## 2021-07-06 DIAGNOSIS — E119 Type 2 diabetes mellitus without complications: Secondary | ICD-10-CM

## 2021-07-09 ENCOUNTER — Other Ambulatory Visit (INDEPENDENT_AMBULATORY_CARE_PROVIDER_SITE_OTHER): Payer: PPO

## 2021-07-09 DIAGNOSIS — E785 Hyperlipidemia, unspecified: Secondary | ICD-10-CM

## 2021-07-09 DIAGNOSIS — E1165 Type 2 diabetes mellitus with hyperglycemia: Secondary | ICD-10-CM

## 2021-07-09 DIAGNOSIS — R7989 Other specified abnormal findings of blood chemistry: Secondary | ICD-10-CM | POA: Diagnosis not present

## 2021-07-09 LAB — COMPREHENSIVE METABOLIC PANEL
ALT: 16 U/L (ref 0–35)
AST: 20 U/L (ref 0–37)
Albumin: 4.5 g/dL (ref 3.5–5.2)
Alkaline Phosphatase: 52 U/L (ref 39–117)
BUN: 12 mg/dL (ref 6–23)
CO2: 27 mEq/L (ref 19–32)
Calcium: 9.5 mg/dL (ref 8.4–10.5)
Chloride: 103 mEq/L (ref 96–112)
Creatinine, Ser: 0.68 mg/dL (ref 0.40–1.20)
GFR: 91.48 mL/min (ref >60.00)
Glucose, Bld: 102 mg/dL — ABNORMAL HIGH (ref 70–99)
Potassium: 4.2 mEq/L (ref 3.5–5.1)
Sodium: 138 mEq/L (ref 135–145)
Total Bilirubin: 0.5 mg/dL (ref 0.2–1.2)
Total Protein: 7.4 g/dL (ref 6.0–8.3)

## 2021-07-09 LAB — LIPID PANEL
Cholesterol: 140 mg/dL (ref 0–200)
HDL: 46.4 mg/dL (ref >39.00)
LDL Cholesterol: 70 mg/dL (ref 0–99)
NonHDL: 93.25
Total CHOL/HDL Ratio: 3
Triglycerides: 117 mg/dL (ref 0.0–149.0)
VLDL: 23.4 mg/dL (ref 0.0–40.0)

## 2021-07-09 LAB — VITAMIN D 25 HYDROXY (VIT D DEFICIENCY, FRACTURES): VITD: 74.24 ng/mL (ref 30.00–100.00)

## 2021-07-09 LAB — HEMOGLOBIN A1C: Hgb A1c MFr Bld: 6.3 % (ref 4.6–6.5)

## 2021-07-11 ENCOUNTER — Other Ambulatory Visit (INDEPENDENT_AMBULATORY_CARE_PROVIDER_SITE_OTHER): Payer: Self-pay | Admitting: Physician Assistant

## 2021-07-11 DIAGNOSIS — E119 Type 2 diabetes mellitus without complications: Secondary | ICD-10-CM

## 2021-07-11 NOTE — Telephone Encounter (Signed)
Katy ?

## 2021-08-14 ENCOUNTER — Encounter: Payer: Self-pay | Admitting: Family Medicine

## 2021-09-08 ENCOUNTER — Other Ambulatory Visit (INDEPENDENT_AMBULATORY_CARE_PROVIDER_SITE_OTHER): Payer: Self-pay | Admitting: Physician Assistant

## 2021-09-08 DIAGNOSIS — E119 Type 2 diabetes mellitus without complications: Secondary | ICD-10-CM

## 2021-09-12 ENCOUNTER — Encounter: Payer: Self-pay | Admitting: Family Medicine

## 2021-09-12 ENCOUNTER — Other Ambulatory Visit: Payer: Self-pay | Admitting: Family Medicine

## 2021-09-12 DIAGNOSIS — E119 Type 2 diabetes mellitus without complications: Secondary | ICD-10-CM

## 2021-09-12 DIAGNOSIS — F418 Other specified anxiety disorders: Secondary | ICD-10-CM

## 2021-09-12 DIAGNOSIS — E785 Hyperlipidemia, unspecified: Secondary | ICD-10-CM

## 2021-09-17 ENCOUNTER — Other Ambulatory Visit: Payer: Self-pay | Admitting: Family Medicine

## 2021-09-17 DIAGNOSIS — E119 Type 2 diabetes mellitus without complications: Secondary | ICD-10-CM

## 2021-10-10 ENCOUNTER — Telehealth: Payer: Self-pay | Admitting: Gastroenterology

## 2021-10-10 NOTE — Telephone Encounter (Signed)
I'm happy to see her. Will await records to review. ?Does she want a clinic visit to discuss a particular issue or just that she is due for a colonoscopy? ?If she has an issue she wants to discuss can book her in the office. If she just wants a colonoscopy I will review her records and determine timing of when she is due. Thanks ?

## 2021-10-10 NOTE — Telephone Encounter (Signed)
Good morning Dr. Havery Moros, ? ?This patient is requesting a transfer of care to you.  She had her last colonoscopy in [place] 11/11/16 and has a history of breast cancer and a family history of colon cancer.  She has no GI issues, no blood thinners, and is a Type II diabetic.  Her records are being sent to you for review.   ? ?Please advise as to how you would like to proceed.   ? ?Thank you. ?

## 2021-10-11 ENCOUNTER — Other Ambulatory Visit: Payer: Self-pay | Admitting: Family Medicine

## 2021-10-11 DIAGNOSIS — E119 Type 2 diabetes mellitus without complications: Secondary | ICD-10-CM

## 2021-10-12 ENCOUNTER — Other Ambulatory Visit (HOSPITAL_COMMUNITY): Payer: Self-pay

## 2021-10-12 MED ORDER — METFORMIN HCL 500 MG PO TABS
500.0000 mg | ORAL_TABLET | Freq: Every day | ORAL | 0 refills | Status: DC
  Filled 2021-10-12: qty 90, 90d supply, fill #0

## 2021-10-12 NOTE — Telephone Encounter (Signed)
Patient states she is not having any problems and advised of recommendation. States she have a family history and want to come in office to discuss have a colonoscopy sooner than recommended time frame. Scheduled OV 6/15.  ?

## 2021-10-17 DIAGNOSIS — Z01419 Encounter for gynecological examination (general) (routine) without abnormal findings: Secondary | ICD-10-CM | POA: Diagnosis not present

## 2021-10-17 DIAGNOSIS — Z01411 Encounter for gynecological examination (general) (routine) with abnormal findings: Secondary | ICD-10-CM | POA: Diagnosis not present

## 2021-10-17 DIAGNOSIS — Z0142 Encounter for cervical smear to confirm findings of recent normal smear following initial abnormal smear: Secondary | ICD-10-CM | POA: Diagnosis not present

## 2021-10-17 DIAGNOSIS — Z124 Encounter for screening for malignant neoplasm of cervix: Secondary | ICD-10-CM | POA: Diagnosis not present

## 2021-11-15 ENCOUNTER — Other Ambulatory Visit (HOSPITAL_COMMUNITY): Payer: Self-pay

## 2021-11-15 MED FILL — Letrozole Tab 2.5 MG: ORAL | 1 days supply | Qty: 1 | Fill #0 | Status: AC

## 2021-11-15 MED FILL — Letrozole Tab 2.5 MG: ORAL | 89 days supply | Qty: 89 | Fill #0 | Status: AC

## 2021-12-03 ENCOUNTER — Other Ambulatory Visit (HOSPITAL_COMMUNITY): Payer: Self-pay

## 2021-12-03 ENCOUNTER — Other Ambulatory Visit: Payer: Self-pay | Admitting: Family Medicine

## 2021-12-03 MED ORDER — METFORMIN HCL 500 MG PO TABS
500.0000 mg | ORAL_TABLET | Freq: Every day | ORAL | 0 refills | Status: DC
Start: 2021-12-03 — End: 2021-12-04
  Filled 2021-12-03: qty 90, 90d supply, fill #0

## 2021-12-04 ENCOUNTER — Other Ambulatory Visit: Payer: Self-pay | Admitting: Family Medicine

## 2021-12-04 ENCOUNTER — Other Ambulatory Visit (HOSPITAL_COMMUNITY): Payer: Self-pay

## 2021-12-04 DIAGNOSIS — E119 Type 2 diabetes mellitus without complications: Secondary | ICD-10-CM

## 2021-12-04 MED ORDER — METFORMIN HCL 500 MG PO TABS
500.0000 mg | ORAL_TABLET | Freq: Two times a day (BID) | ORAL | 1 refills | Status: DC
  Filled 2021-12-04: qty 60, 30d supply, fill #0
  Filled 2021-12-29: qty 60, 30d supply, fill #1

## 2021-12-04 NOTE — Progress Notes (Signed)
Correct metformin dose sent.

## 2021-12-06 ENCOUNTER — Ambulatory Visit: Payer: PPO | Admitting: Gastroenterology

## 2021-12-12 ENCOUNTER — Other Ambulatory Visit (HOSPITAL_COMMUNITY): Payer: Self-pay

## 2021-12-12 MED FILL — Escitalopram Oxalate Tab 20 MG (Base Equiv): ORAL | 90 days supply | Qty: 90 | Fill #0 | Status: AC

## 2021-12-12 MED FILL — Levothyroxine Sodium Tab 137 MCG: ORAL | 90 days supply | Qty: 90 | Fill #0 | Status: AC

## 2021-12-12 MED FILL — Rosuvastatin Calcium Tab 40 MG: ORAL | 90 days supply | Qty: 90 | Fill #0 | Status: AC

## 2021-12-14 ENCOUNTER — Encounter: Payer: Self-pay | Admitting: Cardiology

## 2021-12-14 ENCOUNTER — Telehealth: Payer: Self-pay | Admitting: Family Medicine

## 2021-12-14 ENCOUNTER — Other Ambulatory Visit (HOSPITAL_COMMUNITY): Payer: Self-pay

## 2021-12-14 ENCOUNTER — Encounter: Payer: Self-pay | Admitting: Family Medicine

## 2021-12-14 MED ORDER — LOSARTAN POTASSIUM 25 MG PO TABS: 25.0000 mg | ORAL_TABLET | Freq: Every day | ORAL | 3 refills | Status: DC

## 2021-12-14 NOTE — Telephone Encounter (Signed)
PT called to request a refill for losartan 25 mg. Pt states that she need refill before the weekend.

## 2021-12-14 NOTE — Telephone Encounter (Signed)
Medication prescribed today by Dr. Mayford Knife. Verified with patient

## 2021-12-31 ENCOUNTER — Other Ambulatory Visit (HOSPITAL_COMMUNITY): Payer: Self-pay

## 2022-01-09 ENCOUNTER — Other Ambulatory Visit (HOSPITAL_COMMUNITY): Payer: Self-pay

## 2022-01-09 ENCOUNTER — Ambulatory Visit (INDEPENDENT_AMBULATORY_CARE_PROVIDER_SITE_OTHER): Payer: PPO | Admitting: Family Medicine

## 2022-01-09 VITALS — BP 116/70 | HR 71 | Temp 98.4°F | Resp 19 | Ht 67.0 in | Wt 192.2 lb

## 2022-01-09 DIAGNOSIS — E2839 Other primary ovarian failure: Secondary | ICD-10-CM | POA: Diagnosis not present

## 2022-01-09 DIAGNOSIS — Z13820 Encounter for screening for osteoporosis: Secondary | ICD-10-CM

## 2022-01-09 DIAGNOSIS — E1165 Type 2 diabetes mellitus with hyperglycemia: Secondary | ICD-10-CM

## 2022-01-09 DIAGNOSIS — E785 Hyperlipidemia, unspecified: Secondary | ICD-10-CM

## 2022-01-09 DIAGNOSIS — Z853 Personal history of malignant neoplasm of breast: Secondary | ICD-10-CM | POA: Diagnosis not present

## 2022-01-09 DIAGNOSIS — Z111 Encounter for screening for respiratory tuberculosis: Secondary | ICD-10-CM

## 2022-01-09 DIAGNOSIS — E039 Hypothyroidism, unspecified: Secondary | ICD-10-CM | POA: Diagnosis not present

## 2022-01-09 DIAGNOSIS — F418 Other specified anxiety disorders: Secondary | ICD-10-CM | POA: Diagnosis not present

## 2022-01-09 DIAGNOSIS — E119 Type 2 diabetes mellitus without complications: Secondary | ICD-10-CM | POA: Diagnosis not present

## 2022-01-09 MED ORDER — ESCITALOPRAM OXALATE 20 MG PO TABS
20.0000 mg | ORAL_TABLET | Freq: Every day | ORAL | 1 refills | Status: DC
  Filled 2022-01-09 – 2022-03-11 (×2): qty 90, 90d supply, fill #0

## 2022-01-09 MED ORDER — ROSUVASTATIN CALCIUM 40 MG PO TABS
40.0000 mg | ORAL_TABLET | Freq: Every day | ORAL | 1 refills | Status: DC
  Filled 2022-01-09 – 2022-03-11 (×2): qty 90, 90d supply, fill #0
  Filled 2022-07-10: qty 90, 90d supply, fill #1

## 2022-01-09 MED ORDER — LEVOTHYROXINE SODIUM 137 MCG PO TABS
137.0000 ug | ORAL_TABLET | Freq: Every day | ORAL | 1 refills | Status: DC
  Filled 2022-01-09 – 2022-03-11 (×2): qty 90, 90d supply, fill #0
  Filled 2022-06-25 – 2022-06-26 (×2): qty 90, 90d supply, fill #1

## 2022-01-09 MED ORDER — LETROZOLE 2.5 MG PO TABS
2.5000 mg | ORAL_TABLET | Freq: Every day | ORAL | 1 refills | Status: DC
  Filled 2022-01-09 – 2022-02-13 (×2): qty 90, 90d supply, fill #0
  Filled 2022-05-10: qty 90, 90d supply, fill #1

## 2022-01-09 MED ORDER — METFORMIN HCL 500 MG PO TABS
500.0000 mg | ORAL_TABLET | Freq: Two times a day (BID) | ORAL | 1 refills | Status: DC
  Filled 2022-01-09 – 2022-01-18 (×2): qty 180, 90d supply, fill #0
  Filled 2022-04-18: qty 180, 90d supply, fill #1

## 2022-01-09 NOTE — Progress Notes (Signed)
Subjective:  Patient ID: Brittany Vincent, female    DOB: 06-Jul-1955  Age: 66 y.o. MRN: [id number]  CC:  Chief Complaint  Patient presents with   Hyperlipidemia   Hypertension   Hypothyroidism   Depression   Diabetes    HPI Brittany Vincent presents for   Diabetes: With prior hyperglycemia, obesity.  Previously followed by healthy weight and wellness.  Treated previously with 500 mg twice daily, she is on ARB and statin. Impressive improvement in A1c last visit from 7.2-6.3 in January.  LFTs had also normalized.  Normal lipids and vitamin D level at that time as well. Currently taking metformin BID. No GI or other side effects.  Home readings: lowest 90's to high 119. Average 111.  No symptomatic lows. Microalbumin: 3.2 with normal ratio 11/15/2020. Optho, foot exam, pneumovax: Up-to-date. Wt Readings from Last 3 Encounters:  01/09/22 192 lb 3.2 oz (87.2 kg)  07/05/21 193 lb 12.8 oz (87.9 kg)  04/11/21 194 lb (88 kg)    Lab Results  Component Value Date   HGBA1C 6.3 07/09/2021   HGBA1C 7.2 (H) 11/15/2020   HGBA1C 6.4 (H) 04/12/2020   Lab Results  Component Value Date   MICROALBUR 3.2 (H) 11/15/2020   LDLCALC 70 07/09/2021   CREATININE 0.68 07/09/2021   Hyperlipidemia: Crestor 40 mg daily. No new myalgias or side effects. Not fasting - just ate.  Lab Results  Component Value Date   CHOL 140 07/09/2021   HDL 46.40 07/09/2021   LDLCALC 70 07/09/2021   LDLDIRECT 100.0 11/15/2020   TRIG 117.0 07/09/2021   CHOLHDL 3 07/09/2021   Lab Results  Component Value Date   ALT 16 07/09/2021   AST 20 07/09/2021   ALKPHOS 52 07/09/2021   BILITOT 0.5 07/09/2021   Hypertension: With OSA on CPAP.  Dr. Radford Pax.appt next month Losartan 5 mg daily, diltiazem 180 mg daily, no new side effects.  Home readings: none.  BP Readings from Last 3 Encounters:  01/09/22 116/70  07/05/21 118/72  04/11/21 123/72   Lab Results  Component Value Date   CREATININE 0.68 07/09/2021     Hypothyroidism: Lab Results  Component Value Date   TSH 1.33 11/15/2020  Taking medication daily.  Synthroid 137 mcg daily. No new hot or cold intolerance. No new hair or skin changes, heart palpitations or new fatigue. No new weight changes.   Anxiety: Treated with Lexapro. Managed well. No new concerns.     01/09/2022    2:37 PM 07/05/2021    2:40 PM 01/11/2021    9:38 AM 11/15/2020    2:58 PM 10/25/2020   11:10 AM  Depression screen PHQ 2/9  Decreased Interest 0 0 0 0 0  Down, Depressed, Hopeless 0 0 0 0 0  PHQ - 2 Score 0 0 0 0 0  Altered sleeping  _0 Tired, decreased energy  0 3 3   Change in appetite  0 3 2   Feeling bad or failure about yourself   0 0 0   Trouble concentrating  0 0 0   Moving slowly or fidgety/restless  0 0 0   Suicidal thoughts  0 0 0   PHQ-9 Score  _1 Difficult doing work/chores   Not difficult at all      History of breast cancer Diagnosed in 2013 with bilateral mastectomy, chemo for 6 months, on Femara daily. Oncology appt with mammogram, ultrasound this Friday in [place].   Health maintenance,  bone density ordered today. 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 Needs quantiferon testing.   History Patient Active Problem List   Diagnosis Date Noted   Type 2 diabetes mellitus with hyperglycemia, without long-term current use of insulin ([place]) 02/17/2021   OSA (obstructive sleep apnea) 02/15/2021   Class 1 obesity with serious comorbidity and body mass index (BMI) of 31.0 to 31.9 in adult 02/15/2021   Genetic testing 12/22/2020   Family history of malignant neoplasm of ovary 12/13/2020   Family history of malignant neoplasm of digestive organ 12/13/2020   Family history of melanoma 12/13/2020   Family history of uterine cancer 12/13/2020   Personal history of malignant neoplasm of breast 05/01/2016   Hypothyroidism 05/01/2016   Hyperlipidemia 05/01/2016   Breast cancer ([place]) 2013   Past Medical History:  Diagnosis Date    Breast cancer ([place]) 2013   Depression    Family history of malignant neoplasm of digestive organ    Family history of malignant neoplasm of ovary    Family history of melanoma    Family history of uterine cancer    Fatty liver    Hyperlipidemia    Hypertension    Infertility, female    Lactose intolerance    Obese    Personal history of malignant neoplasm of breast    Pre-diabetes    Shortness of breath on exertion    Sleep apnea    Thyroid disease    Past Surgical History:  Procedure Laterality Date   BREAST SURGERY     BUNIONECTOMY Right    HERNIA REPAIR     REFRACTIVE SURGERY     Allergies  Allergen Reactions   No Known Allergies    Prior to Admission medications   Medication Sig Start Date End Date Taking? Authorizing Provider  blood glucose meter kit and supplies Dispense based on patient and insurance preference. Use up to four times daily as directed. (FOR ICD-10 E10.9, E11.9). 11/15/19  Yes Wendie Agreste, MD  Cholecalciferol (VITAMIN D) 125 MCG (5000 UT) CAPS Take by mouth.   Yes [provider]  diltiazem (CARDIZEM CD) 180 MG 24 hr capsule Take 1 capsule (180 mg total) by mouth daily. 02/28/21  Yes Turner, Eber Hong, MD  doxylamine, Sleep, (UNISOM) 25 MG tablet Take 25 mg by mouth at bedtime as needed for sleep. 1 nightly   Yes [provider]  escitalopram (LEXAPRO) 20 MG tablet Take 1 tablet (20 mg total) by mouth daily. 09/12/21  Yes Wendie Agreste, MD  letrozole [facility]) 2.5 MG tablet Take 1 tablet (2.5 mg total) by mouth daily. 09/12/21  Yes Wendie Agreste, MD  levothyroxine (SYNTHROID) 137 MCG tablet Take 1 tablet (137 mcg total) by mouth daily before breakfast. 06/13/21  Yes Wendie Agreste, MD  losartan (COZAAR) 25 MG tablet Take 1 tablet (25 mg total) by mouth daily. 12/14/21  Yes Turner, Eber Hong, MD  metFORMIN (GLUCOPHAGE) 500 MG tablet Take 1 tablet (500 mg total) by mouth 2 (two) times daily. 12/04/21  Yes Wendie Agreste, MD   rosuvastatin (CRESTOR) 40 MG tablet Take 1 tablet (40 mg total) by mouth daily. 09/12/21  Yes Wendie Agreste, MD   Social History   Socioeconomic History   Marital status: Married    Spouse name: Not on file   Number of children: 3   Years of education: Not on file   Highest education level: Not on file  Occupational History  Occupation: Therapist, sports   Occupation: Holiday representative  Tobacco Use   Smoking status: Never   Smokeless tobacco: Never  Vaping Use   Vaping Use: Never used  Substance and Sexual Activity   Alcohol use: No   Drug use: No   Sexual activity: Not on file  Other Topics Concern   Not on file  Social History Narrative   Lives with her husband.   Older son lives in [place], [place] with his wife and their 2 children.   Younger Nature conservation officer) son lives in [place], with his wife Lisbeth Renshaw) and their daughter, Ava.   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   Daughter recently moved to [place].   Moved to [place] from [place], [place] to be nearer to [place], [place] and Ava.   Social Determinants of Health   Financial Resource Strain: Not on file  Food Insecurity: Not on file  Transportation Needs: Not on file  Physical Activity: Not on file  Stress: Not on file  Social Connections: Not on file  Intimate Partner Violence: Not on file    Review of Systems  Constitutional:  Negative for fatigue and unexpected weight change.  Respiratory:  Negative for chest tightness and shortness of breath.   Cardiovascular:  Negative for chest pain, palpitations and leg swelling.  Gastrointestinal:  Negative for abdominal pain and blood in stool.  Neurological:  Negative for dizziness, syncope, light-headedness and headaches.     Objective:   Vitals:   01/09/22 1417  BP: 116/70  Pulse: 71  Resp: 19  Temp: 98.4 F (36.9 C)  TempSrc: Oral  SpO2: 96%  Weight: 192 lb 3.2 oz (87.2 kg)  Height: _0  (1.702 m)     Physical Exam Vitals reviewed.   Constitutional:      Appearance: Normal appearance. She is well-developed.  HENT:     Head: Normocephalic and atraumatic.  Eyes:     Conjunctiva/sclera: Conjunctivae normal.     Pupils: Pupils are equal, round, and reactive to light.  Neck:     Vascular: No carotid bruit.  Cardiovascular:     Rate and Rhythm: Normal rate and regular rhythm.     Heart sounds: Normal heart sounds.  Pulmonary:     Effort: Pulmonary effort is normal.     Breath sounds: Normal breath sounds.  Abdominal:     Palpations: Abdomen is soft. There is no pulsatile mass.     Tenderness: There is no abdominal tenderness.  Musculoskeletal:     Right lower leg: No edema.     Left lower leg: No edema.  Skin:    General: Skin is warm and dry.  Neurological:     Mental Status: She is alert and oriented to person, place, and time.  Psychiatric:        Mood and Affect: Mood normal.        Behavior: Behavior normal.        Thought Content: Thought content normal.        Assessment & Plan:  Brittany Vincent is a 66 y.o. female . Type 2 diabetes mellitus with hyperglycemia, without long-term current use of insulin ([name]) - Plan: metFORMIN (GLUCOPHAGE) 500 MG tablet, Comprehensive metabolic panel, Hemoglobin A1c, Microalbumin / creatinine urine ratio  -Check updated labs -fasting lab visit planned.  Tolerating current dose metformin, continue same, dosage adjustments or changes based on results.  Check urine microalbumin/creatinine ratio.  Hyperlipidemia, unspecified hyperlipidemia type - Plan: rosuvastatin (CRESTOR) 40 MG tablet, Comprehensive  metabolic panel, Lipid panel  -  Stable, tolerating current regimen. Medications refilled. Labs pending as above.   Situational anxiety - Plan: escitalopram (LEXAPRO) 20 MG tablet, TSH  -Stable on current dose Lexapro, check TSH, continue same dose with RTC precautions.  Hypothyroidism, unspecified type - Plan: levothyroxine (SYNTHROID) 137 MCG tablet  -  Stable, tolerating  current regimen. Medications refilled. Labs pending as above.   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  Screening for osteoporosis - Plan: DG BONE DENSITY (DXA) Estrogen deficiency - Plan: DG BONE DENSITY (DXA)  History of breast cancer - Plan: letrozole (FEMARA) 2.5 MG tablet  -Continue follow-up with oncologist and screening exams as above, continue letrozole for now.  Meds ordered this encounter  Medications   rosuvastatin (CRESTOR) 40 MG tablet    Sig: Take 1 tablet (40 mg total) by mouth daily.    Dispense:  90 tablet    Refill:  1   escitalopram (LEXAPRO) 20 MG tablet    Sig: Take 1 tablet (20 mg total) by mouth daily.    Dispense:  90 tablet    Refill:  1   letrozole (FEMARA) 2.5 MG tablet    Sig: Take 1 tablet (2.5 mg total) by mouth daily.    Dispense:  90 tablet    Refill:  1   levothyroxine (SYNTHROID) 137 MCG tablet    Sig: Take 1 tablet (137 mcg total) by mouth daily before breakfast.    Dispense:  90 tablet    Refill:  1   metFORMIN (GLUCOPHAGE) 500 MG tablet    Sig: Take 1 tablet (500 mg total) by mouth 2 (two) times daily.    Dispense:  180 tablet    Refill:  1   Patient Instructions  Have fasting labs done in next 1 week.  [facility] in 8:30-4:30 during weekdays, no appointment needed [facility].  [place], [place] [address]  No change in meds today.     Signed,   Merri Ray, MD [place] 01/09/22 2:32 PM

## 2022-01-09 NOTE — Patient Instructions (Addendum)
Have fasting labs done in next 1 week.  [facility] 8:30-4:30 during weekdays, no appointment needed [place].  [place], [place] [address]  No change in meds today.

## 2022-01-10 LAB — MICROALBUMIN / CREATININE URINE RATIO
Creatinine,U: 75.1 mg/dL
Microalb Creat Ratio: 1.4 mg/g (ref 0.0–30.0)
Microalb, Ur: 1 mg/dL (ref 0.0–1.9)

## 2022-01-11 ENCOUNTER — Encounter: Payer: Self-pay | Admitting: Family Medicine

## 2022-01-11 DIAGNOSIS — Z853 Personal history of malignant neoplasm of breast: Secondary | ICD-10-CM | POA: Diagnosis not present

## 2022-01-11 DIAGNOSIS — Z801 Family history of malignant neoplasm of trachea, bronchus and lung: Secondary | ICD-10-CM | POA: Diagnosis not present

## 2022-01-11 DIAGNOSIS — Z08 Encounter for follow-up examination after completed treatment for malignant neoplasm: Secondary | ICD-10-CM | POA: Diagnosis not present

## 2022-01-11 DIAGNOSIS — Z9013 Acquired absence of bilateral breasts and nipples: Secondary | ICD-10-CM | POA: Diagnosis not present

## 2022-01-11 DIAGNOSIS — L905 Scar conditions and fibrosis of skin: Secondary | ICD-10-CM | POA: Diagnosis not present

## 2022-01-11 DIAGNOSIS — Z800 Family history of malignant neoplasm of digestive organs: Secondary | ICD-10-CM | POA: Diagnosis not present

## 2022-01-11 DIAGNOSIS — C50911 Malignant neoplasm of unspecified site of right female breast: Secondary | ICD-10-CM | POA: Diagnosis not present

## 2022-01-11 LAB — HM MAMMOGRAPHY

## 2022-01-15 ENCOUNTER — Encounter: Payer: Self-pay | Admitting: Family Medicine

## 2022-01-15 ENCOUNTER — Other Ambulatory Visit (HOSPITAL_COMMUNITY): Payer: Self-pay

## 2022-01-16 ENCOUNTER — Other Ambulatory Visit (INDEPENDENT_AMBULATORY_CARE_PROVIDER_SITE_OTHER): Payer: PPO

## 2022-01-16 DIAGNOSIS — E1165 Type 2 diabetes mellitus with hyperglycemia: Secondary | ICD-10-CM

## 2022-01-16 DIAGNOSIS — F418 Other specified anxiety disorders: Secondary | ICD-10-CM

## 2022-01-16 DIAGNOSIS — E785 Hyperlipidemia, unspecified: Secondary | ICD-10-CM

## 2022-01-16 DIAGNOSIS — Z111 Encounter for screening for respiratory tuberculosis: Secondary | ICD-10-CM | POA: Diagnosis not present

## 2022-01-16 LAB — LIPID PANEL
Cholesterol: 151 mg/dL (ref 0–200)
HDL: 48.1 mg/dL (ref >39.00)
LDL Cholesterol: 64 mg/dL (ref 0–99)
NonHDL: 102.76
Total CHOL/HDL Ratio: 3
Triglycerides: 192 mg/dL — ABNORMAL HIGH (ref 0.0–149.0)
VLDL: 38.4 mg/dL (ref 0.0–40.0)

## 2022-01-16 LAB — COMPREHENSIVE METABOLIC PANEL
ALT: 18 U/L (ref 0–35)
AST: 22 U/L (ref 0–37)
Albumin: 4.5 g/dL (ref 3.5–5.2)
Alkaline Phosphatase: 48 U/L (ref 39–117)
BUN: 16 mg/dL (ref 6–23)
CO2: 27 mEq/L (ref 19–32)
Calcium: 9.7 mg/dL (ref 8.4–10.5)
Chloride: 104 mEq/L (ref 96–112)
Creatinine, Ser: 0.7 mg/dL (ref 0.40–1.20)
GFR: 90.51 mL/min (ref >60.00)
Glucose, Bld: 107 mg/dL — ABNORMAL HIGH (ref 70–99)
Potassium: 4.2 mEq/L (ref 3.5–5.1)
Sodium: 139 mEq/L (ref 135–145)
Total Bilirubin: 0.4 mg/dL (ref 0.2–1.2)
Total Protein: 7.7 g/dL (ref 6.0–8.3)

## 2022-01-16 LAB — TSH: TSH: 1.92 u[IU]/mL (ref 0.35–5.50)

## 2022-01-16 LAB — HEMOGLOBIN A1C: Hgb A1c MFr Bld: 6.5 % (ref 4.6–6.5)

## 2022-01-17 ENCOUNTER — Other Ambulatory Visit (HOSPITAL_COMMUNITY): Payer: Self-pay

## 2022-01-18 ENCOUNTER — Other Ambulatory Visit (HOSPITAL_COMMUNITY): Payer: Self-pay

## 2022-01-19 LAB — QUANTIFERON-TB GOLD PLUS
Mitogen-NIL: 7.8 IU/mL
NIL: 0.01 IU/mL
QuantiFERON-TB Gold Plus: NEGATIVE
TB1-NIL: 0.03 IU/mL
TB2-NIL: 0.07 IU/mL

## 2022-01-23 ENCOUNTER — Ambulatory Visit: Payer: PPO | Admitting: Gastroenterology

## 2022-01-30 ENCOUNTER — Encounter (INDEPENDENT_AMBULATORY_CARE_PROVIDER_SITE_OTHER): Payer: Self-pay

## 2022-02-06 ENCOUNTER — Ambulatory Visit: Payer: PPO | Admitting: Cardiology

## 2022-02-06 ENCOUNTER — Telehealth: Payer: Self-pay | Admitting: *Deleted

## 2022-02-06 ENCOUNTER — Encounter: Payer: Self-pay | Admitting: Cardiology

## 2022-02-06 VITALS — BP 120/78 | HR 76 | Ht 67.0 in | Wt 192.0 lb

## 2022-02-06 DIAGNOSIS — I10 Essential (primary) hypertension: Secondary | ICD-10-CM

## 2022-02-06 DIAGNOSIS — I4510 Unspecified right bundle-branch block: Secondary | ICD-10-CM

## 2022-02-06 DIAGNOSIS — G4733 Obstructive sleep apnea (adult) (pediatric): Secondary | ICD-10-CM

## 2022-02-06 DIAGNOSIS — I471 Supraventricular tachycardia: Secondary | ICD-10-CM

## 2022-02-06 NOTE — Patient Instructions (Signed)
Medication Instructions:  Your physician recommends that you continue on your current medications as directed. Please refer to the Current Medication list given to you today.  *If you need a refill on your cardiac medications before your next appointment, please call your pharmacy*  Testing/Procedures: Your physician has requested that you have a calcium score CT scan. There is a $99 fee for the scan.   Follow-Up: At [facility], you and your health needs are our priority.  As part of our continuing mission to provide you with exceptional heart care, we have created designated Provider Care Teams.  These Care Teams include your primary Cardiologist (physician) and Advanced Practice Providers (APPs -  Physician Assistants and Nurse Practitioners) who all work together to provide you with the care you need, when you need it.  Your next appointment:   1 year(s)  The format for your next appointment:   In Person  Provider:   Fransico Him, MD     Other Instructions We will order you new supplies for your CPAP machine and will get you an appointment with your DME  Important Information About [place]

## 2022-02-06 NOTE — Telephone Encounter (Signed)
-----   Message from Antonieta Iba, RN sent at 02/06/2022  1:55 PM EDT ----- Per Dr. Radford Pax:  Please order new supplies for the patient ASAP and get an appointment for her with her DME.  Thanks! Carly

## 2022-02-06 NOTE — Telephone Encounter (Signed)
Cpap supplies odered   Appointment with dme to troubleshoot her cpap

## 2022-02-06 NOTE — Progress Notes (Addendum)
Cardiology Note    Date:  02/06/2022   ID:  Brittany Vincent, DOB 1956/02/11, MRN [id number]  PCP:  Brittany Agreste, MD  Cardiologist:  Brittany Him, MD   Chief Complaint  Patient presents with   Sleep Apnea   Hypertension    History of Present Illness:  Brittany Vincent is a 66 y.o. female with a history of hypertension, hyperlipidemia and depression.  She also has a history of breast cancer.  I saw her a year ago for palpitations, excessive daytime sleepiness with loud snoring and abnormal EKG with right bundle branch block.2D echo 12/11/2020 showed normal LV function with EF 55 to 60% with grade 1 diastolic dysfunction.  Exercise treadmill test 11/2020 showed no ischemia.  Event monitor 12/11/2020 showed PVCs and nonsustained atrial tachycardia.  Amlodipine was stopped and she was started on Cardizem CD 180 mg daily.  She also underwent sleep study which demonstrated severe obstructive sleep apnea with an AHI of 35.4/h and minimal central sleep apnea with a pAHIc of 6.5/h with nocturnal hypoxemia and O2 saturations less than 88% for 11.6 minutes.  Never followed up and is here now for follow-up.  Last fall she underwent CPAP titration to 15 cm H2O.  She is here today for followup and is doing well.  She denies any chest pain or pressure, SOB, DOE, PND, orthopnea, LE edema, dizziness, palpitations or syncope. She is compliant with her meds and is tolerating meds with no SE.    She is doing well with her CPAP device and thinks that she has gotten used to it.  Unfortunately the water cannister broke and the tubing has a crack in it.  It also does not use the same amount of water each night. She tolerates the mask and feels the pressure is adequate.  Since going on CPAP she feels rested in the am and has no significant daytime sleepiness.  She denies any significant mouth or nasal dryness or nasal congestion.  She does not think that he snores.     Past Medical History:  Diagnosis Date   Breast  cancer ([place]) 2013   Depression    Family history of malignant neoplasm of digestive organ    Family history of malignant neoplasm of ovary    Family history of melanoma    Family history of uterine cancer    Fatty liver    Hyperlipidemia    Hypertension    Infertility, female    Lactose intolerance    Obese    Personal history of malignant neoplasm of breast    Pre-diabetes    Shortness of breath on exertion    Sleep apnea    Thyroid disease     Past Surgical History:  Procedure Laterality Date   BREAST SURGERY     BUNIONECTOMY Right    HERNIA REPAIR     REFRACTIVE SURGERY      Current Medications: Current Meds  Medication Sig   Cholecalciferol (VITAMIN D) 125 MCG (5000 UT) CAPS Take by mouth.   diltiazem (CARDIZEM CD) 180 MG 24 hr capsule Take 1 capsule (180 mg total) by mouth daily.   doxylamine, Sleep, (UNISOM) 25 MG tablet Take 25 mg by mouth at bedtime as needed for sleep. 1 nightly   escitalopram (LEXAPRO) 20 MG tablet Take 1 tablet (20 mg total) by mouth daily.   letrozole (FEMARA) 2.5 MG tablet Take 1 tablet (2.5 mg total) by mouth daily.   levothyroxine (SYNTHROID) 137 MCG tablet Take 1 tablet (  137 mcg total) by mouth daily before breakfast.   losartan (COZAAR) 25 MG tablet Take 1 tablet (25 mg total) by mouth daily.   metFORMIN (GLUCOPHAGE) 500 MG tablet Take 1 tablet (500 mg total) by mouth 2 (two) times daily.   rosuvastatin (CRESTOR) 40 MG tablet Take 1 tablet (40 mg total) by mouth daily.    Allergies:   No known allergies   Social History   Socioeconomic History   Marital status: Married    Spouse name: Not on file   Number of children: 3   Years of education: Not on file   Highest education level: Not on file  Occupational History   Occupation: Therapist, sports   Occupation: Telephonic nursing  Tobacco Use   Smoking status: Never   Smokeless tobacco: Never  Vaping Use   Vaping Use: Never used  Substance and Sexual Activity   Alcohol use: No   Drug use:  No   Sexual activity: Not on file  Other Topics Concern   Not on file  Social History Narrative   Lives with her husband.   Older son lives in [place], [place] with his wife and their 2 children.   Younger Nature conservation officer) son lives in [place], with his wife Brittany Vincent) and their daughter, Brittany Vincent.   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   Daughter recently moved to [place].   Moved to [place] from [place], [place] to be nearer to Brittany Vincent, [place] and Brittany Vincent.   Social Determinants of Health   Financial Resource Strain: Not on file  Food Insecurity: Not on file  Transportation Needs: Not on file  Physical Activity: Not on file  Stress: Not on file  Social Connections: Not on file     Family History:  The patient's family history includes Cancer in her father, maternal grandfather, and mother; Colon cancer in her maternal aunt and maternal grandmother; Heart disease in her mother and paternal grandmother; Hyperlipidemia in her mother; Stroke in her maternal grandmother and paternal grandfather.   ROS:   Please see the history of present illness.    ROS All other systems reviewed and are negative.      No data to display          PHYSICAL EXAM:   VS:  BP 120/78   Pulse 76   Ht '5\' 7"'$  (1.702 m)   Wt 192 lb (87.1 kg)   BMI 30.07 kg/m    GEN: Well nourished, well developed in no acute distress HEENT: Normal NECK: No JVD; No carotid bruits LYMPHATICS: No lymphadenopathy CARDIAC:RRR, no murmurs, rubs, gallops RESPIRATORY:  Clear to auscultation without rales, wheezing or rhonchi  ABDOMEN: Soft, non-tender, non-distended MUSCULOSKELETAL:  No edema; No deformity  SKIN: Warm and dry NEUROLOGIC:  Alert and oriented x 3 PSYCHIATRIC:  Normal affect   Wt Readings from Last 3 Encounters:  02/06/22 192 lb (87.1 kg)  01/09/22 192 lb 3.2 oz (87.2 kg)  07/05/21 193 lb 12.8 oz (87.9 kg)      Studies/Labs Reviewed:   EKG:  EKG is not ordered today.   Recent Labs: 01/16/2022:  ALT 18; BUN 16; Creatinine, Ser 0.70; Potassium 4.2; Sodium 139; TSH 1.92   Lipid Panel    Component Value Date/Time   CHOL 151 01/16/2022 0939   CHOL 157 12/22/2019 1130   TRIG 192.0 (H) 01/16/2022 0939   HDL 48.10 01/16/2022 0939   HDL 41 12/22/2019 1130   CHOLHDL 3 01/16/2022 0939   VLDL 38.4 01/16/2022  [place] 01/16/2022 0939   LDLCALC 85 12/22/2019 1130   LDLDIRECT 100.0 11/15/2020 1440   Additional studies/ records that were reviewed today include:  Nuclear stress test, 2D echo, sleep study, CPAP titration, PAP compliance download  ASSESSMENT:    1. RBBB   2. OSA (obstructive sleep apnea)   3. PAT (paroxysmal atrial tachycardia) ([place])   4. Benign essential HTN      PLAN:  In order of problems listed above:  Right bundle ranch block -Exercise treadmill test in 2022 showed no ischemia -She is asymptomatic -Recommend she gets a coronary calcium score for future risk assessment  2.  OSA - The patient is tolerating PAP therapy well without any problems. The PAP download performed by his DME was personally reviewed and interpreted by me today and showed an AHI of 0.9/hr on auto CPAP cm H2O with 91% compliance in using more than 4 hours nightly.  The patient has been using and benefiting from PAP use and will continue to benefit from therapy.  -order new tubing and water chamber and set up an in person appt with DME to look at her device since is appears not to be using H2O up  3.  Paroxysmal atrial tachycardia -Well-controlled on medical therapy -Continue prescription drug management with Cardizem CD 180 mg daily with as needed refills  4.  Hypertension -BP controlled on current medication -Continue prescription drug management Cardizem CD 180 mg daily and losartan 25 mg daily with as needed refills   Medication Adjustments/Labs and Tests Ordered: Current medicines are reviewed at length with the patient today.  Concerns regarding medicines are outlined  above.  Medication changes, Labs and Tests ordered today are listed in the Patient Instructions below.  There are no Patient Instructions on file for this visit.   Signed, Brittany Him, MD  02/06/2022 1:27 PM    [facility] [address], [place], [place]  [address] Phone: [phone]; Fax: [phone]

## 2022-02-08 DIAGNOSIS — G4733 Obstructive sleep apnea (adult) (pediatric): Secondary | ICD-10-CM | POA: Diagnosis not present

## 2022-02-14 ENCOUNTER — Other Ambulatory Visit (HOSPITAL_COMMUNITY): Payer: Self-pay

## 2022-02-24 DIAGNOSIS — G4733 Obstructive sleep apnea (adult) (pediatric): Secondary | ICD-10-CM | POA: Diagnosis not present

## 2022-02-25 ENCOUNTER — Other Ambulatory Visit (HOSPITAL_BASED_OUTPATIENT_CLINIC_OR_DEPARTMENT_OTHER): Payer: Self-pay | Admitting: Cardiology

## 2022-02-27 ENCOUNTER — Other Ambulatory Visit (HOSPITAL_COMMUNITY): Payer: Self-pay

## 2022-02-27 MED ORDER — DILTIAZEM HCL ER COATED BEADS 180 MG PO CP24
180.0000 mg | ORAL_CAPSULE | Freq: Every day | ORAL | 3 refills | Status: DC
  Filled 2022-02-27: qty 90, 90d supply, fill #0
  Filled 2022-05-29: qty 90, 90d supply, fill #1
  Filled 2022-09-03: qty 90, 90d supply, fill #2
  Filled 2022-12-18: qty 90, 90d supply, fill #3

## 2022-03-06 ENCOUNTER — Other Ambulatory Visit (HOSPITAL_BASED_OUTPATIENT_CLINIC_OR_DEPARTMENT_OTHER): Payer: PPO

## 2022-03-12 ENCOUNTER — Other Ambulatory Visit (HOSPITAL_COMMUNITY): Payer: Self-pay

## 2022-03-12 ENCOUNTER — Encounter (HOSPITAL_BASED_OUTPATIENT_CLINIC_OR_DEPARTMENT_OTHER): Payer: Self-pay | Admitting: Cardiology

## 2022-03-12 MED ORDER — LOSARTAN POTASSIUM 25 MG PO TABS
25.0000 mg | ORAL_TABLET | Freq: Every day | ORAL | 3 refills | Status: DC
  Filled 2022-03-12: qty 90, 90d supply, fill #0
  Filled 2022-06-25 – 2022-06-26 (×2): qty 90, 90d supply, fill #1
  Filled 2022-09-19: qty 90, 90d supply, fill #2
  Filled 2022-12-18: qty 90, 90d supply, fill #3

## 2022-04-05 ENCOUNTER — Ambulatory Visit (HOSPITAL_BASED_OUTPATIENT_CLINIC_OR_DEPARTMENT_OTHER)
Admission: RE | Admit: 2022-04-05 | Discharge: 2022-04-05 | Disposition: A | Discharge status: Home / Self Care | Payer: PPO | Source: Ambulatory Visit | Attending: Cardiology | Admitting: Cardiology

## 2022-04-05 DIAGNOSIS — I4510 Unspecified right bundle-branch block: Secondary | ICD-10-CM | POA: Insufficient documentation

## 2022-04-05 DIAGNOSIS — I4719 Other supraventricular tachycardia: Secondary | ICD-10-CM | POA: Insufficient documentation

## 2022-04-05 DIAGNOSIS — G4733 Obstructive sleep apnea (adult) (pediatric): Secondary | ICD-10-CM | POA: Insufficient documentation

## 2022-04-05 DIAGNOSIS — I10 Essential (primary) hypertension: Secondary | ICD-10-CM | POA: Insufficient documentation

## 2022-04-07 ENCOUNTER — Encounter: Payer: Self-pay | Admitting: Cardiology

## 2022-04-07 DIAGNOSIS — I2510 Atherosclerotic heart disease of native coronary artery without angina pectoris: Secondary | ICD-10-CM

## 2022-04-07 DIAGNOSIS — R931 Abnormal findings on diagnostic imaging of heart and coronary circulation: Secondary | ICD-10-CM | POA: Insufficient documentation

## 2022-04-07 HISTORY — DX: Atherosclerotic heart disease of native coronary artery without angina pectoris: I25.10

## 2022-04-08 ENCOUNTER — Telehealth: Payer: Self-pay

## 2022-04-08 DIAGNOSIS — E782 Mixed hyperlipidemia: Secondary | ICD-10-CM

## 2022-04-08 NOTE — Telephone Encounter (Signed)
Spoke with pt and advised of Ca score results per Dr Radford Pax as below.  Pt states she was fasting with her last lipid panel.  Advised will make Dr Radford Pax aware and will contact with any further instructions. Pt verbalizes understanding and thanked Therapist, sports for the call.

## 2022-04-08 NOTE — Telephone Encounter (Signed)
-----   Message from Sueanne Margarita, MD sent at 04/07/2022  3:30 PM EDT ----- Mildly elevated coronary Ca core of 44.7 which is 70th % for age, sex and race matched patients. LDL goal < 70 which it was fine at 64 - please find out if her last lipids in July were fasting as her TAGs are elevated

## 2022-04-10 ENCOUNTER — Other Ambulatory Visit (HOSPITAL_COMMUNITY): Payer: Self-pay

## 2022-04-10 MED ORDER — FENOFIBRATE 48 MG PO TABS
48.0000 mg | ORAL_TABLET | Freq: Every day | ORAL | 3 refills | Status: DC
  Filled 2022-04-10: qty 90, 90d supply, fill #0
  Filled 2022-07-10: qty 90, 90d supply, fill #1
  Filled 2022-10-10: qty 90, 90d supply, fill #2
  Filled 2023-01-06: qty 90, 90d supply, fill #3

## 2022-04-10 NOTE — Addendum Note (Signed)
Addended by: Bernestine Amass on: 04/10/2022 01:08 PM   Modules accepted: Orders

## 2022-04-10 NOTE — Telephone Encounter (Signed)
Spoke with the patient and she would like to try fenofibrate. Prescription has been sent in. Repeat labs have been scheduled.

## 2022-04-18 ENCOUNTER — Other Ambulatory Visit (HOSPITAL_COMMUNITY): Payer: Self-pay

## 2022-04-19 ENCOUNTER — Encounter (HOSPITAL_BASED_OUTPATIENT_CLINIC_OR_DEPARTMENT_OTHER): Payer: Self-pay | Admitting: Cardiology

## 2022-04-22 DIAGNOSIS — G4733 Obstructive sleep apnea (adult) (pediatric): Secondary | ICD-10-CM | POA: Diagnosis not present

## 2022-04-29 DIAGNOSIS — H33301 Unspecified retinal break, right eye: Secondary | ICD-10-CM | POA: Diagnosis not present

## 2022-04-29 DIAGNOSIS — E119 Type 2 diabetes mellitus without complications: Secondary | ICD-10-CM | POA: Diagnosis not present

## 2022-05-09 ENCOUNTER — Ambulatory Visit (INDEPENDENT_AMBULATORY_CARE_PROVIDER_SITE_OTHER): Payer: PPO | Admitting: *Deleted

## 2022-05-09 DIAGNOSIS — Z0000 Encounter for general adult medical examination without abnormal findings: Secondary | ICD-10-CM

## 2022-05-09 NOTE — Progress Notes (Signed)
Subjective:   Brittany Vincent is a 66 y.o. female who presents for an Initial Medicare Annual Wellness Visit.  I connected with  Brittany Vincent on 05/09/22 by a telephone enabled telemedicine application and verified that I am speaking with the correct person using two identifiers.   I discussed the limitations of evaluation and management by telemedicine. The patient expressed understanding and agreed to proceed.  Patient location: home  Provider location: Tele-Health-home    Review of Systems     Cardiac Risk Factors include: advanced age (>2mn, >>86women);diabetes mellitus;obesity (BMI >30kg/m2);family history of premature cardiovascular disease     Objective:    Today's Vitals   There is no height or weight on file to calculate BMI.     05/09/2022    2:01 PM 03/02/2021   10:32 PM 08/05/2018    3:06 PM  Advanced Directives  Does Patient Have a Medical Advance Directive? No No No  Would patient like information on creating a medical advance directive? No - Patient declined No - Patient declined No - Patient declined    Current Medications (verified) Outpatient Encounter Medications as of 05/09/2022  Medication Sig   blood glucose meter kit and supplies Dispense based on patient and insurance preference. Use up to four times daily as directed. (FOR ICD-10 E10.9, E11.9).   diltiazem (CARTIA XT) 180 MG 24 hr capsule Take 1 capsule (180 mg total) by mouth daily.   doxylamine, Sleep, (UNISOM) 25 MG tablet Take 25 mg by mouth at bedtime as needed for sleep. 1 nightly   escitalopram (LEXAPRO) 20 MG tablet Take 1 tablet (20 mg total) by mouth daily.   fenofibrate (TRICOR) 48 MG tablet Take 1 tablet (48 mg total) by mouth daily.   letrozole (FEMARA) 2.5 MG tablet Take 1 tablet (2.5 mg total) by mouth daily.   levothyroxine (SYNTHROID) 137 MCG tablet Take 1 tablet (137 mcg total) by mouth daily before breakfast.   losartan (COZAAR) 25 MG tablet Take 1 tablet (25 mg total) by mouth  daily.   metFORMIN (GLUCOPHAGE) 500 MG tablet Take 1 tablet (500 mg total) by mouth 2 (two) times daily.   rosuvastatin (CRESTOR) 40 MG tablet Take 1 tablet (40 mg total) by mouth daily.   Cholecalciferol (VITAMIN D) 125 MCG (5000 UT) CAPS Take by mouth. (Patient not taking: Reported on 05/09/2022)   No facility-administered encounter medications on file as of 05/09/2022.    Allergies (verified) No known allergies   History: Past Medical History:  Diagnosis Date   Agatston coronary artery calcium score less than 100    44.7 on scan 03/2022   [place]   Depression    Family history of malignant neoplasm of digestive organ    Family history of malignant neoplasm of ovary    Family history of melanoma    Family history of uterine cancer    Fatty liver    Hyperlipidemia    Hypertension    Infertility, female    Lactose intolerance    Obese    Personal history of malignant neoplasm of breast    Pre-diabetes    Shortness of breath on exertion    Sleep apnea    Thyroid disease    Past Surgical History:  Procedure Laterality Date   BREAST SURGERY     BUNIONECTOMY Right    HERNIA REPAIR     REFRACTIVE SURGERY     Family History  Problem Relation Age of Onset   Cancer Mother  Hyperlipidemia Mother    Heart disease Mother    Cancer Father    Stroke Maternal Grandmother    Colon cancer Maternal Grandmother    Cancer Maternal Grandfather    Heart disease Paternal Grandmother    Stroke Paternal Grandfather    Colon cancer Maternal Aunt    Social History   Socioeconomic History   Marital status: Married    Spouse name: Not on file   Number of children: 3   Years of education: Not on file   Highest education level: Not on file  Occupational History   Occupation: Therapist, sports   Occupation: Telephonic nursing  Tobacco Use   Smoking status: Never   Smokeless tobacco: Never  Vaping Use   Vaping Use: Never used  Substance and Sexual Activity   Alcohol use: No    Drug use: No   Sexual activity: Not on file  Other Topics Concern   Not on file  Social History Narrative   Lives with her husband.   Older son lives in [place], [place] with his wife and their 2 children.   Younger Nature conservation officer) son lives in [place], with his wife Lisbeth Renshaw) and their daughter, Ava.   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   Daughter recently moved to [place].   Moved to [place] from [place], [place] to be nearer to [place], [place] and Ava.   Social Determinants of Health   Financial Resource Strain: Low Risk  (05/09/2022)   Overall Financial Resource Strain (CARDIA)    Difficulty of Paying Living Expenses: Not hard at all  Food Insecurity: No Food Insecurity (05/09/2022)   Hunger Vital Sign    Worried About Running Out of Food in the Last Year: Never true    Ran Out of Food in the Last Year: Never true  Transportation Needs: No Transportation Needs (05/09/2022)   PRAPARE - Hydrologist (Medical): No    Lack of Transportation (Non-Medical): No  Physical Activity: Inactive (05/09/2022)   Exercise Vital Sign    Days of Exercise per Week: 0 days    Minutes of Exercise per Session: 0 min  Stress: No Stress Concern Present (05/09/2022)   [place]    Feeling of Stress : Not at all  Social Connections: Moderately Isolated (05/09/2022)   Social Connection and Isolation Panel [NHANES]    Frequency of Communication with Friends and Family: More than three times a week    Frequency of Social Gatherings with Friends and Family: More than three times a week    Attends Religious Services: Never    Marine scientist or Organizations: No    Attends Music therapist: Never    Marital Status: Married    Tobacco Counseling Counseling given: Not Answered   Clinical Intake:  Pre-visit preparation completed: Yes  Pain : No/denies pain      Diabetes: Yes CBG done?: No Did pt. bring in CBG monitor from home?: No  How often do you need to have someone help you when you read instructions, pamphlets, or other written materials from your doctor or pharmacy?: 1 - Never  Diabetic?  Yes  Nutrition Risk Assessment:  Has the patient had any N/V/D within the last 2 months?  No  Does the patient have any non-healing wounds?  No  Has the patient had any unintentional weight loss or weight gain?  No   Diabetes:  Is the  patient diabetic?  Yes  If diabetic, was a CBG obtained today?  No  Did the patient bring in their glucometer from home?  No  How often do you monitor your CBG's?  Does not check often.   Financial Strains and Diabetes Management:  Are you having any financial strains with the device, your supplies or your medication? No .  Does the patient want to be seen by Chronic Care Management for management of their diabetes?  No  Would the patient like to be referred to a Nutritionist or for Diabetic Management?  No   Diabetic Exams:  Diabetic Eye Exam: Completed  2323.Marland Kitchen Pt has been advised about the importance.  Diabetic Foot Exam:  Pt has been advised about the importance in completing this exam.   Interpreter Needed?: No  Information entered by :: Leroy Kennedy LPN   Activities of Daily Living    05/09/2022    2:01 PM  In your present state of health, do you have any difficulty performing the following activities:  Hearing? 0  Vision? 0  Difficulty concentrating or making decisions? 0  Walking or climbing stairs? 0  Dressing or bathing? 0  Doing errands, shopping? 0  Preparing Food and eating ? N  Using the Toilet? N  In the past six months, have you accidently leaked urine? N  Do you have problems with loss of bowel control? N  Managing your Medications? N  Managing your Finances? N  Housekeeping or managing your Housekeeping? N    Patient Care Team: Wendie Agreste, MD as PCP - General (Family  Medicine) Sueanne Margarita, MD as PCP - Cardiology (Cardiology)  Indicate any recent Medical Services you may have received from other than [facility] providers in the past year (date may be approximate).     Assessment:   This is a routine wellness examination for [place].  Hearing/Vision screen No results found.  Dietary issues and exercise activities discussed: Current Exercise Habits: The patient does not participate in regular exercise at present   Goals Addressed             This Visit's Progress    Weight (lb) < 200 lb (90.7 kg)       5-10 pounds   Be more dedicated increase activity       Depression Screen    05/09/2022    1:45 PM 01/09/2022    2:37 PM 07/05/2021    2:40 PM 01/11/2021    9:38 AM 11/15/2020    2:58 PM 10/25/2020   11:10 AM 04/12/2020    3:39 PM  PHQ 2/9 Scores  PHQ - 2 Score 0 0 0 0 0 0 0  PHQ- 9 Score 0  _0 Fall Risk    01/09/2022    2:37 PM 07/05/2021    2:40 PM 11/15/2020    1:42 PM 10/25/2020   11:10 AM 04/12/2020    3:39 PM  [place] in the past year? 0 0 0 0 0  Number falls in past yr: 0 0  0   Injury with Fall? 0 0  0   Risk for fall due to : No Fall Risks No Fall Risks     Follow up _1     FALL RISK PREVENTION PERTAINING TO THE HOME:  Any stairs in or around the home? Yes  If so, are  there any without handrails? No  Home free of loose throw rugs in walkways, pet beds, electrical cords, etc? Yes  Adequate lighting in your home to reduce risk of falls? Yes   ASSISTIVE DEVICES UTILIZED TO PREVENT FALLS:  Life alert? No  Use of a cane, walker or w/c? No  Grab bars in the bathroom? Yes  Shower chair or bench in shower? No  Elevated toilet seat or a handicapped toilet? Yes   TIMED UP AND GO:  Was the test performed? No .    Cognitive Function:        05/09/2022    1:52 PM 11/15/2019     3:17 PM  6CIT Screen  What Year? 0 points 0 points  What month? 0 points 0 points  What time? 0 points 0 points  Count back from 20 0 points 0 points  Months in reverse 0 points 0 points  Repeat phrase 0 points 0 points  Total Score 0 points 0 points    Immunizations Immunization History  Administered Date(s) Administered   Fluad Quad(high Dose 65+) 05/02/2022   Influenza, Seasonal, Injecte, Preservative Fre 02/25/2013   Influenza,inj,Quad PF,6+ Mos 04/09/2017, 03/25/2019, 04/12/2020   Influenza,inj,quad, With Preservative 03/18/2016, 04/09/2017   Influenza-Unspecified 06/05/2021   PFIZER(Purple Top)SARS-COV-2 Vaccination 08/04/2019, 08/25/2019, 04/03/2020, 10/01/2020, 05/02/2022   PNEUMOCOCCAL CONJUGATE-20 07/05/2021    TDAP status: Up to date  Flu Vaccine status: Up to date  Pneumococcal vaccine status: Up to date  Covid-19 vaccine status: Information provided on how to obtain vaccines.   Qualifies for Shingles Vaccine?  yes Zostavax completed No   Shingrix Completed?: No.    Education has been provided regarding the importance of this vaccine. Patient has been advised to call insurance company to determine out of pocket expense if they have not yet received this vaccine. Advised may also receive vaccine at local pharmacy or Health Dept. Verbalized acceptance and understanding.  Screening Tests Health Maintenance  Topic Date Due   OPHTHALMOLOGY EXAM  Never done   Zoster Vaccines- Shingrix (1 of 2) Never done   DEXA SCAN  Never done   TETANUS/TDAP  01/10/2023 (Originally 03/27/1975)   COVID-19 Vaccine (6 - Pfizer risk series) 06/27/2022   FOOT EXAM  07/05/2022   HEMOGLOBIN A1C  07/19/2022   Diabetic kidney evaluation - Urine ACR  01/10/2023   Diabetic kidney evaluation - GFR measurement  01/17/2023   Medicare Annual Wellness (AWV)  05/10/2023   MAMMOGRAM  01/12/2024   COLONOSCOPY (Pts 45-38yr Insurance coverage will need to be confirmed)  01/23/2027   Pneumonia  Vaccine 66 Years old  Completed   INFLUENZA VACCINE  Completed   Hepatitis C Screening  Completed   HPV VACCINES  Aged Out    Health Maintenance  Health Maintenance Due  Topic Date Due   OPHTHALMOLOGY EXAM  Never done   Zoster Vaccines- Shingrix (1 of 2) Never done   DEXA SCAN  Never done    [facility]on[facility]  [facility] has also confirmed with insurance that it will be covered     Mammogram status: Completed  . Repeat every year  Bone Density is scheduled  Lung Cancer Screening: (Low Dose CT Chest recommended if Age 66-80years, 30 pack-year currently smoking OR have quit w/in 15years.) does not qualify.   Lung Cancer Screening Referral:   Additional Screening:  Hepatitis C Screening: does not qualify; Completed 2017  Vision Screening: Recommended annual ophthalmology exams for early  detection of glaucoma and other disorders of the eye. Is the patient up to date with their annual eye exam?  Yes  Who is the provider or what is the name of the office in which the patient attends annual eye exams? [facility] If pt is not established with a provider, would they like to be referred to a provider to establish care? No .   Dental Screening: Recommended annual dental exams for proper oral hygiene  Community Resource Referral / Chronic Care Management: CRR required this visit?  No   CCM required this visit?  No      Plan:     I have personally reviewed and noted the following in the patient's chart:   Medical and social history Use of alcohol, tobacco or illicit drugs  Current medications and supplements including opioid prescriptions. Patient is not currently taking opioid prescriptions. Functional ability and status Nutritional status Physical activity Advanced directives List of other physicians Hospitalizations, surgeries, and ER visits in previous 12 months Vitals Screenings to include cognitive, depression, and  falls Referrals and appointments  In addition, I have reviewed and discussed with patient certain preventive protocols, quality metrics, and best practice recommendations. A written personalized care plan for preventive services as well as general preventive health recommendations were provided to patient.     Leroy Kennedy, LPN   13/01/6577   Nurse Notes:

## 2022-05-09 NOTE — Patient Instructions (Signed)
Brittany Vincent , Thank you for taking time to come for your Medicare Wellness Visit. I appreciate your ongoing commitment to your health goals. Please review the following plan we discussed and let me know if I can assist you in the future.   These are the goals we discussed:  Goals      Weight (lb) < 200 lb (90.7 kg)     5-10 pounds   Be more dedicated increase activity        This is a list of the screening recommended for you and due dates:  Health Maintenance  Topic Date Due   Eye exam for diabetics  Never done   Zoster (Shingles) Vaccine (1 of 2) Never done   DEXA scan (bone density measurement)  Never done   Tetanus Vaccine  01/10/2023*   COVID-19 Vaccine (6 - Pfizer risk series) 06/27/2022   Complete foot exam   07/05/2022   Hemoglobin A1C  07/19/2022   Yearly kidney health urinalysis for diabetes  01/10/2023   Yearly kidney function blood test for diabetes  01/17/2023   Medicare Annual Wellness Visit  05/10/2023   Mammogram  01/12/2024   Colon Cancer Screening  01/23/2027   Pneumonia Vaccine  Completed   Flu Shot  Completed   Hepatitis C Screening: USPSTF Recommendation to screen - Ages 18-79 yo.  Completed   HPV Vaccine  Aged Out  *Topic was postponed. The date shown is not the original due date.    Advanced directives: not on file  Conditions/risks identified:   Next appointment: Follow up in one year for your annual wellness visit 07-12-2021 [place] 11:00   Preventive Care 65 Years and Older, Female Preventive care refers to lifestyle choices and visits with your health care provider that can promote health and wellness. What does preventive care include? A yearly physical exam. This is also called an annual well check. Dental exams once or twice a year. Routine eye exams. Ask your health care provider how often you should have your eyes checked. Personal lifestyle choices, including: Daily care of your teeth and gums. Regular physical activity. Eating a healthy  diet. Avoiding tobacco and drug use. Limiting alcohol use. Practicing safe sex. Taking low-dose aspirin every day. Taking vitamin and mineral supplements as recommended by your health care provider. What happens during an annual well check? The services and screenings done by your health care provider during your annual well check will depend on your age, overall health, lifestyle risk factors, and family history of disease. Counseling  Your health care provider may ask you questions about your: Alcohol use. Tobacco use. Drug use. Emotional well-being. Home and relationship well-being. Sexual activity. Eating habits. History of falls. Memory and ability to understand (cognition). Work and work Statistician. Reproductive health. Screening  You may have the following tests or measurements: Height, weight, and BMI. Blood pressure. Lipid and cholesterol levels. These may be checked every 5 years, or more frequently if you are over 50 years old. Skin check. Lung cancer screening. You may have this screening every year starting at age 64 if you have a 30-pack-year history of smoking and currently smoke or have quit within the past 15 years. Fecal occult blood test (FOBT) of the stool. You may have this test every year starting at age 1. Flexible sigmoidoscopy or colonoscopy. You may have a sigmoidoscopy every 5 years or a colonoscopy every 10 years starting at age 74. Hepatitis C blood test. Hepatitis B blood test. Sexually transmitted disease (STD)  testing. Diabetes screening. This is done by checking your blood sugar (glucose) after you have not eaten for a while (fasting). You may have this done every 1-3 years. Bone density scan. This is done to screen for osteoporosis. You may have this done starting at age 13. Mammogram. This may be done every 1-2 years. Talk to your health care provider about how often you should have regular mammograms. Talk with your health care provider about  your test results, treatment options, and if necessary, the need for more tests. Vaccines  Your health care provider may recommend certain vaccines, such as: Influenza vaccine. This is recommended every year. Tetanus, diphtheria, and acellular pertussis (Tdap, Td) vaccine. You may need a Td booster every 10 years. Zoster vaccine. You may need this after age 65. Pneumococcal 13-valent conjugate (PCV13) vaccine. One dose is recommended after age 52. Pneumococcal polysaccharide (PPSV23) vaccine. One dose is recommended after age 69. Talk to your health care provider about which screenings and vaccines you need and how often you need them. This information is not intended to replace advice given to you by your health care provider. Make sure you discuss any questions you have with your health care provider. Document Released: 07/07/2015 Document Revised: 02/28/2016 Document Reviewed: 04/11/2015 Elsevier Interactive Patient Education  2017 [place] Prevention in the Home Falls can cause injuries. They can happen to people of all ages. There are many things you can do to make your home safe and to help prevent falls. What can I do on the outside of my home? Regularly fix the edges of walkways and driveways and fix any cracks. Remove anything that might make you trip as you walk through a door, such as a raised step or threshold. Trim any bushes or trees on the path to your home. Use bright outdoor lighting. Clear any walking paths of anything that might make someone trip, such as rocks or tools. Regularly check to see if handrails are loose or broken. Make sure that both sides of any steps have handrails. Any raised decks and porches should have guardrails on the edges. Have any leaves, snow, or ice cleared regularly. Use sand or salt on walking paths during winter. Clean up any spills in your garage right away. This includes oil or grease spills. What can I do in the bathroom? Use  night lights. Install grab bars by the toilet and in the tub and shower. Do not use towel bars as grab bars. Use non-skid mats or decals in the tub or shower. If you need to sit down in the shower, use a plastic, non-slip stool. Keep the floor dry. Clean up any water that spills on the floor as soon as it happens. Remove soap buildup in the tub or shower regularly. Attach bath mats securely with double-sided non-slip rug tape. Do not have throw rugs and other things on the floor that can make you trip. What can I do in the bedroom? Use night lights. Make sure that you have a light by your bed that is easy to reach. Do not use any sheets or blankets that are too big for your bed. They should not hang down onto the floor. Have a firm chair that has side arms. You can use this for support while you get dressed. Do not have throw rugs and other things on the floor that can make you trip. What can I do in the kitchen? Clean up any spills right away. Avoid walking on wet  floors. Keep items that you use a lot in easy-to-reach places. If you need to reach something above you, use a strong step stool that has a grab bar. Keep electrical cords out of the way. Do not use floor polish or wax that makes floors slippery. If you must use wax, use non-skid floor wax. Do not have throw rugs and other things on the floor that can make you trip. What can I do with my stairs? Do not leave any items on the stairs. Make sure that there are handrails on both sides of the stairs and use them. Fix handrails that are broken or loose. Make sure that handrails are as long as the stairways. Check any carpeting to make sure that it is firmly attached to the stairs. Fix any carpet that is loose or worn. Avoid having throw rugs at the top or bottom of the stairs. If you do have throw rugs, attach them to the floor with carpet tape. Make sure that you have a light switch at the top of the stairs and the bottom of the  stairs. If you do not have them, ask someone to add them for you. What else can I do to help prevent falls? Wear shoes that: Do not have high heels. Have rubber bottoms. Are comfortable and fit you well. Are closed at the toe. Do not wear sandals. If you use a stepladder: Make sure that it is fully opened. Do not climb a closed stepladder. Make sure that both sides of the stepladder are locked into place. Ask someone to hold it for you, if possible. Clearly mark and make sure that you can see: Any grab bars or handrails. First and last steps. Where the edge of each step is. Use tools that help you move around (mobility aids) if they are needed. These include: Canes. Walkers. Scooters. Crutches. Turn on the lights when you go into a dark area. Replace any light bulbs as soon as they burn out. Set up your furniture so you have a clear path. Avoid moving your furniture around. If any of your floors are uneven, fix them. If there are any pets around you, be aware of where they are. Review your medicines with your doctor. Some medicines can make you feel dizzy. This can increase your chance of falling. Ask your doctor what other things that you can do to help prevent falls. This information is not intended to replace advice given to you by your health care provider. Make sure you discuss any questions you have with your health care provider. Document Released: 04/06/2009 Document Revised: 11/16/2015 Document Reviewed: 07/15/2014 Elsevier Interactive Patient Education  2017 [employer].

## 2022-05-10 ENCOUNTER — Other Ambulatory Visit (HOSPITAL_COMMUNITY): Payer: Self-pay

## 2022-05-23 DIAGNOSIS — G4733 Obstructive sleep apnea (adult) (pediatric): Secondary | ICD-10-CM | POA: Diagnosis not present

## 2022-05-29 ENCOUNTER — Other Ambulatory Visit (HOSPITAL_COMMUNITY): Payer: Self-pay

## 2022-06-22 DIAGNOSIS — G4733 Obstructive sleep apnea (adult) (pediatric): Secondary | ICD-10-CM | POA: Diagnosis not present

## 2022-06-26 ENCOUNTER — Other Ambulatory Visit (HOSPITAL_COMMUNITY): Payer: Self-pay

## 2022-07-01 ENCOUNTER — Other Ambulatory Visit: Payer: PPO

## 2022-07-04 ENCOUNTER — Other Ambulatory Visit: Payer: PPO

## 2022-07-10 ENCOUNTER — Other Ambulatory Visit: Payer: Self-pay | Admitting: Family Medicine

## 2022-07-10 ENCOUNTER — Other Ambulatory Visit (HOSPITAL_COMMUNITY): Payer: Self-pay

## 2022-07-10 DIAGNOSIS — E119 Type 2 diabetes mellitus without complications: Secondary | ICD-10-CM

## 2022-07-10 DIAGNOSIS — E1165 Type 2 diabetes mellitus with hyperglycemia: Secondary | ICD-10-CM

## 2022-07-10 MED ORDER — METFORMIN HCL 500 MG PO TABS
500.0000 mg | ORAL_TABLET | Freq: Two times a day (BID) | ORAL | 1 refills | Status: DC
  Filled 2022-07-10: qty 180, 90d supply, fill #0
  Filled 2022-10-10: qty 180, 90d supply, fill #1

## 2022-07-11 ENCOUNTER — Other Ambulatory Visit: Payer: Self-pay

## 2022-07-11 ENCOUNTER — Other Ambulatory Visit (HOSPITAL_COMMUNITY): Payer: Self-pay

## 2022-07-12 ENCOUNTER — Ambulatory Visit: Payer: Medicare HMO | Admitting: Family Medicine

## 2022-07-12 ENCOUNTER — Other Ambulatory Visit: Payer: Self-pay

## 2022-07-15 ENCOUNTER — Ambulatory Visit (INDEPENDENT_AMBULATORY_CARE_PROVIDER_SITE_OTHER): Payer: Medicare HMO | Admitting: Family Medicine

## 2022-07-15 ENCOUNTER — Other Ambulatory Visit (HOSPITAL_COMMUNITY): Payer: Self-pay

## 2022-07-15 ENCOUNTER — Inpatient Hospital Stay: Admission: RE | Admit: 2022-07-15 | Payer: PPO | Source: Ambulatory Visit

## 2022-07-15 VITALS — BP 114/60 | HR 62 | Temp 98.4°F | Ht 67.0 in | Wt 193.2 lb

## 2022-07-15 DIAGNOSIS — R7989 Other specified abnormal findings of blood chemistry: Secondary | ICD-10-CM | POA: Diagnosis not present

## 2022-07-15 DIAGNOSIS — E039 Hypothyroidism, unspecified: Secondary | ICD-10-CM | POA: Diagnosis not present

## 2022-07-15 DIAGNOSIS — E785 Hyperlipidemia, unspecified: Secondary | ICD-10-CM

## 2022-07-15 DIAGNOSIS — F418 Other specified anxiety disorders: Secondary | ICD-10-CM

## 2022-07-15 DIAGNOSIS — Z1321 Encounter for screening for nutritional disorder: Secondary | ICD-10-CM | POA: Diagnosis not present

## 2022-07-15 DIAGNOSIS — R69 Illness, unspecified: Secondary | ICD-10-CM | POA: Diagnosis not present

## 2022-07-15 DIAGNOSIS — E119 Type 2 diabetes mellitus without complications: Secondary | ICD-10-CM | POA: Diagnosis not present

## 2022-07-15 MED ORDER — ROSUVASTATIN CALCIUM 40 MG PO TABS
40.0000 mg | ORAL_TABLET | Freq: Every day | ORAL | 1 refills | Status: DC
  Filled 2022-07-15 – 2022-10-10 (×2): qty 90, 90d supply, fill #0
  Filled 2023-01-06: qty 90, 90d supply, fill #1

## 2022-07-15 MED ORDER — LEVOTHYROXINE SODIUM 137 MCG PO TABS
137.0000 ug | ORAL_TABLET | Freq: Every day | ORAL | 1 refills | Status: DC
  Filled 2022-07-15 – 2022-09-19 (×2): qty 90, 90d supply, fill #0
  Filled 2022-12-18: qty 90, 90d supply, fill #1

## 2022-07-15 MED ORDER — ESCITALOPRAM OXALATE 20 MG PO TABS
20.0000 mg | ORAL_TABLET | Freq: Every day | ORAL | 1 refills | Status: DC
  Filled 2022-07-15: qty 90, 90d supply, fill #0
  Filled 2022-10-17: qty 90, 90d supply, fill #1

## 2022-07-15 NOTE — Progress Notes (Signed)
Subjective:  Patient ID: Brittany Vincent, female    DOB: August 24, 1955  Age: 67 y.o. MRN: [id number]  CC:  Chief Complaint  Patient presents with   Hypothyroidism   Hyperlipidemia   Diabetes   Labs Only    Pt requesting labs on behalf of other providers  ALT, Lipid for Dr Radford Pax Vitamin D for oncology     HPI Brittany Vincent presents for   Situational anxiety: Lexapro '20mg'$  qd. Doing well. No new side effects and managing sx's well.     07/15/2022    1:46 PM 05/09/2022    1:45 PM 01/09/2022    2:37 PM 07/05/2021    2:40 PM 01/11/2021    9:38 AM  Depression screen PHQ 2/9  Decreased Interest 0 0 0 0 0  Down, Depressed, Hopeless 0 0 0 0 0  PHQ - 2 Score 0 0 0 0 0  Altered sleeping 0 0  1 3  Tired, decreased energy 0 0  0 3  Change in appetite 0 0  0 3  Feeling bad or failure about yourself  0 0  0 0  Trouble concentrating  0  0 0  Moving slowly or fidgety/restless 0 0  0 0  Suicidal thoughts 0 0  0 0  PHQ-9 Score 0 0  1 9  Difficult doing work/chores  Not difficult at all   Not difficult at all     Hypothyroidism: Lab Results  Component Value Date   TSH 1.92 01/16/2022  Synthroid 13mg QD.  Taking medication daily.  No new hot or cold intolerance. No new hair or skin changes, heart palpitations or new fatigue. No new weight changes.   Hypertension: Diltiazem '180mg'$  qd, losartan '25mg'$  qd.  Home readings:none BP Readings from Last 3 Encounters:  07/15/22 114/60  02/06/22 120/78  01/09/22 116/70   Lab Results  Component Value Date   CREATININE 0.70 01/16/2022   Hyperlipidemia: With elevated CCS, fenofibrate '48mg'$  added by her cardiologist - Dr. TRadford Pax No new side effects with her '40mg'$  Crestor QD., no new myalgias.    Lab Results  Component Value Date   CHOL 151 01/16/2022   HDL 48.10 01/16/2022   LDLCALC 64 01/16/2022   LDLDIRECT 100.0 11/15/2020   TRIG 192.0 (H) 01/16/2022   CHOLHDL 3 01/16/2022   Lab Results  Component Value Date   ALT 18 01/16/2022   AST  22 01/16/2022   ALKPHOS 48 01/16/2022   BILITOT 0.4 01/16/2022   Elevated vitamin D: Had been on multiple supplements. No current supplement other than what is in [facility].  Last vitamin D Lab Results  Component Value Date   VD25OH 74.24 07/09/2021     Diabetes: Metformin '500mg'$  BID, also on ARB and statin.  Fasting: 138 after pizza night prior  - 111-116 usually Postprandial - none.  No sx lows.  Microalbumin: normal 01/09/22.  Optho, foot exam, pneumovax: optho in August. Yearly visits. She will have optho send me a report.   Wt Readings from Last 3 Encounters:  07/15/22 193 lb 3.2 oz (87.6 kg)  02/06/22 192 lb (87.1 kg)  01/09/22 192 lb 3.2 oz (87.2 kg)    Lab Results  Component Value Date   HGBA1C 6.5 01/16/2022   HGBA1C 6.3 07/09/2021   HGBA1C 7.2 (H) 11/15/2020   Lab Results  Component Value Date   MICROALBUR 1.0 01/09/2022   LDLCALC 64 01/16/2022   CREATININE 0.70 01/16/2022        History Patient Active Problem  List   Diagnosis Date Noted   Agatston coronary artery calcium score less than 100 04/07/2022   Type 2 diabetes mellitus with hyperglycemia, without long-term current use of insulin ([place]) 02/17/2021   OSA (obstructive sleep apnea) 02/15/2021   Class 1 obesity with serious comorbidity and body mass index (BMI) of 31.0 to 31.9 in adult 02/15/2021   Genetic testing 12/22/2020   Family history of malignant neoplasm of ovary 12/13/2020   Family history of malignant neoplasm of digestive organ 12/13/2020   Family history of melanoma 12/13/2020   Family history of uterine cancer 12/13/2020   Personal history of malignant neoplasm of breast 05/01/2016   Hypothyroidism 05/01/2016   Hyperlipidemia 05/01/2016   Breast cancer ([place]) 2013   Past Medical History:  Diagnosis Date   Agatston coronary artery calcium score less than 100    44.7 on scan 03/2022   Breast cancer ([place]) 2013   Depression    Family history of malignant neoplasm of digestive organ     Family history of malignant neoplasm of ovary    Family history of melanoma    Family history of uterine cancer    Fatty liver    Hyperlipidemia    Hypertension    Infertility, female    Lactose intolerance    Obese    Personal history of malignant neoplasm of breast    Pre-diabetes    Shortness of breath on exertion    Sleep apnea    Thyroid disease    Past Surgical History:  Procedure Laterality Date   BREAST SURGERY     BUNIONECTOMY Right    HERNIA REPAIR     REFRACTIVE SURGERY     Allergies  Allergen Reactions   No Known Allergies    Prior to Admission medications   Medication Sig Start Date End Date Taking? Authorizing Provider  blood glucose meter kit and supplies Dispense based on patient and insurance preference. Use up to four times daily as directed. (FOR ICD-10 E10.9, E11.9). 11/15/19  Yes Wendie Agreste, MD  diltiazem (CARTIA XT) 180 MG 24 hr capsule Take 1 capsule (180 mg total) by mouth daily. 02/27/22  Yes Turner, Eber Hong, MD  doxylamine, Sleep, (UNISOM) 25 MG tablet Take 25 mg by mouth at bedtime as needed for sleep. 1 nightly   Yes [provider]  escitalopram (LEXAPRO) 20 MG tablet Take 1 tablet (20 mg total) by mouth daily. 01/09/22  Yes Wendie Agreste, MD  fenofibrate (TRICOR) 48 MG tablet Take 1 tablet (48 mg total) by mouth daily. 04/10/22  Yes Turner, Eber Hong, MD  letrozole (FEMARA) 2.5 MG tablet Take 1 tablet (2.5 mg total) by mouth daily. 01/09/22  Yes Wendie Agreste, MD  levothyroxine (SYNTHROID) 137 MCG tablet Take 1 tablet (137 mcg total) by mouth daily before breakfast. 01/09/22  Yes Wendie Agreste, MD  losartan (COZAAR) 25 MG tablet Take 1 tablet (25 mg total) by mouth daily. 03/12/22  Yes Turner, Eber Hong, MD  metFORMIN (GLUCOPHAGE) 500 MG tablet Take 1 tablet (500 mg total) by mouth 2 (two) times daily. 07/10/22  Yes Wendie Agreste, MD  rosuvastatin (CRESTOR) 40 MG tablet Take 1 tablet (40 mg total) by mouth daily. 01/09/22  Yes  Wendie Agreste, MD  Cholecalciferol (VITAMIN D) 125 MCG (5000 UT) CAPS Take by mouth. Patient not taking: Reported on 05/09/2022    [provider]   Social History   Socioeconomic History   Marital status: Married  Spouse name: Not on file   Number of children: 3   Years of education: Not on file   Highest education level: Not on file  Occupational History   Occupation: Therapist, sports   Occupation: Telephonic nursing  Tobacco Use   Smoking status: Never   Smokeless tobacco: Never  Vaping Use   Vaping Use: Never used  Substance and Sexual Activity   Alcohol use: No   Drug use: No   Sexual activity: Not on file  Other Topics Concern   Not on file  Social History Narrative   Lives with her husband.   Older son lives in [place], [place] with his wife and their 2 children.   Younger Nature conservation officer) son lives in [place], with his wife Lisbeth Renshaw) and their daughter, Ava.   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   Daughter recently moved to [place].   Moved to [place] from [place], [place] to be nearer to [place], [place] and A[place].   Social Determinants of Health   Financial Resource Strain: Low Risk  (05/09/2022)   Overall Financial Resource Strain (CARDIA)    Difficulty of Paying Living Expenses: Not hard at all  Food Insecurity: No Food Insecurity (05/09/2022)   Hunger Vital Sign    Worried About Running Out of Food in the Last Year: Never true    Ran Out of Food in the Last Year: Never true  Transportation Needs: No Transportation Needs (05/09/2022)   PRAPARE - Hydrologist (Medical): No    Lack of Transportation (Non-Medical): No  Physical Activity: Inactive (05/09/2022)   Exercise Vital Sign    Days of Exercise per Week: 0 days    Minutes of Exercise per Session: 0 min  Stress: No Stress Concern Present (05/09/2022)   [place]    Feeling of Stress : Not at all   Social Connections: Moderately Isolated (05/09/2022)   Social Connection and Isolation Panel [NHANES]    Frequency of Communication with Friends and Family: More than three times a week    Frequency of Social Gatherings with Friends and Family: More than three times a week    Attends Religious Services: Never    Marine scientist or Organizations: No    Attends Archivist Meetings: Never    Marital Status: Married  Human resources officer Violence: Not At Risk (05/09/2022)   Humiliation, Afraid, Rape, and Kick questionnaire    Fear of Current or Ex-Partner: No    Emotionally Abused: No    Physically Abused: No    Sexually Abused: No    Review of Systems  Constitutional:  Negative for fatigue and unexpected weight change.  Respiratory:  Negative for chest tightness and shortness of breath.   Cardiovascular:  Negative for chest pain, palpitations and leg swelling.  Gastrointestinal:  Negative for abdominal pain and blood in stool.  Neurological:  Negative for dizziness, syncope, light-headedness and headaches.     Objective:   Vitals:   07/15/22 1436  BP: 114/60  Pulse: 62  Temp: 98.4 F (36.9 C)  TempSrc: Oral  SpO2: 97%  Weight: 193 lb 3.2 oz (87.6 kg)  Height: '5\' 7"'$  (1.702 m)     Physical Exam Vitals reviewed.  Constitutional:      Appearance: Normal appearance. She is well-developed.  HENT:     Head: Normocephalic and atraumatic.  Eyes:     Conjunctiva/sclera: Conjunctivae normal.  Pupils: Pupils are equal, round, and reactive to light.  Neck:     Vascular: No carotid bruit.  Cardiovascular:     Rate and Rhythm: Normal rate and regular rhythm.     Heart sounds: Normal heart sounds.  Pulmonary:     Effort: Pulmonary effort is normal.     Breath sounds: Normal breath sounds.  Abdominal:     Palpations: Abdomen is soft. There is no pulsatile mass.     Tenderness: There is no abdominal tenderness.  Musculoskeletal:     Right lower leg: No edema.      Left lower leg: No edema.  Skin:    General: Skin is warm and dry.  Neurological:     Mental Status: She is alert and oriented to person, place, and time.  Psychiatric:        Mood and Affect: Mood normal.        Behavior: Behavior normal.     Assessment & Plan:  Brittany Vincent is a 67 y.o. female . Hyperlipidemia, unspecified hyperlipidemia type - Plan: Comprehensive metabolic panel, Lipid panel, rosuvastatin (CRESTOR) 40 MG tablet  -  Stable, tolerating current regimen. Medications refilled. Labs pending as above.   Hypothyroidism, unspecified type - Plan: TSH, levothyroxine (SYNTHROID) 137 MCG tablet  -  Stable, tolerating current regimen. Medications refilled. Labs pending as above.   Type 2 diabetes mellitus without complication, without long-term current use of insulin ([name]) - Plan: Comprehensive metabolic panel, Hemoglobin A1c  -Tolerating current dose of metformin, continue same, updated labs ordered.  Continue statin, ARB, and requested copy of her most recent ophthalmology eval.  Considering [place] program, plan is to continue to monitor diet/exercise for weight management.  Encounter for vitamin deficiency screening - Plan: VITAMIN D 25 Hydroxy (Vit-D Deficiency, Fractures)  -Prior elevated readings, check levels. High serum vitamin D - Plan: VITAMIN D 25 Hydroxy (Vit-D Deficiency, Fractures)  Situational anxiety - Plan: escitalopram (LEXAPRO) 20 MG tablet  -Stable, continue same dose Lexapro.  Meds ordered this encounter  Medications   escitalopram (LEXAPRO) 20 MG tablet    Sig: Take 1 tablet (20 mg total) by mouth daily.    Dispense:  90 tablet    Refill:  1   levothyroxine (SYNTHROID) 137 MCG tablet    Sig: Take 1 tablet (137 mcg total) by mouth daily before breakfast.    Dispense:  90 tablet    Refill:  1   rosuvastatin (CRESTOR) 40 MG tablet    Sig: Take 1 tablet (40 mg total) by mouth daily.    Dispense:  90 tablet    Refill:  1   Patient Instructions   Thanks for coming in today.  No medication changes for now.  I will let you know if there are any concerns on your labs. Take care!    Signed,   Merri Ray, MD [place] 07/15/22 2:50 PM

## 2022-07-15 NOTE — Patient Instructions (Addendum)
Thanks for coming in today.  No medication changes for now.  I will let you know if there are any concerns on your labs. Take care!

## 2022-07-16 ENCOUNTER — Encounter: Payer: Self-pay | Admitting: Family Medicine

## 2022-07-16 LAB — LIPID PANEL
Cholesterol: 156 mg/dL (ref 0–200)
HDL: 50.4 mg/dL (ref >39.00)
LDL Cholesterol: 67 mg/dL (ref 0–99)
NonHDL: 105.27
Total CHOL/HDL Ratio: 3
Triglycerides: 191 mg/dL — ABNORMAL HIGH (ref 0.0–149.0)
VLDL: 38.2 mg/dL (ref 0.0–40.0)

## 2022-07-16 LAB — COMPREHENSIVE METABOLIC PANEL
ALT: 16 U/L (ref 0–35)
AST: 21 U/L (ref 0–37)
Albumin: 4.6 g/dL (ref 3.5–5.2)
Alkaline Phosphatase: 48 U/L (ref 39–117)
BUN: 17 mg/dL (ref 6–23)
CO2: 28 mEq/L (ref 19–32)
Calcium: 9.9 mg/dL (ref 8.4–10.5)
Chloride: 103 mEq/L (ref 96–112)
Creatinine, Ser: 0.7 mg/dL (ref 0.40–1.20)
GFR: 90.2 mL/min (ref >60.00)
Glucose, Bld: 99 mg/dL (ref 70–99)
Potassium: 4.6 mEq/L (ref 3.5–5.1)
Sodium: 139 mEq/L (ref 135–145)
Total Bilirubin: 0.4 mg/dL (ref 0.2–1.2)
Total Protein: 7.6 g/dL (ref 6.0–8.3)

## 2022-07-16 LAB — VITAMIN D 25 HYDROXY (VIT D DEFICIENCY, FRACTURES): VITD: 44.78 ng/mL (ref 30.00–100.00)

## 2022-07-16 LAB — TSH: TSH: 1.07 u[IU]/mL (ref 0.35–5.50)

## 2022-07-16 LAB — HEMOGLOBIN A1C: Hgb A1c MFr Bld: 6.7 % — ABNORMAL HIGH (ref 4.6–6.5)

## 2022-07-18 ENCOUNTER — Encounter: Payer: Self-pay | Admitting: Family Medicine

## 2022-07-18 ENCOUNTER — Encounter (HOSPITAL_BASED_OUTPATIENT_CLINIC_OR_DEPARTMENT_OTHER): Payer: Self-pay | Admitting: Cardiology

## 2022-07-18 DIAGNOSIS — E785 Hyperlipidemia, unspecified: Secondary | ICD-10-CM

## 2022-08-02 ENCOUNTER — Telehealth: Payer: Self-pay | Admitting: Hematology and Oncology

## 2022-08-02 NOTE — Progress Notes (Signed)
Pt came in to office and signed medical release form per NP in order to transfer care from [place], [place] to Dr. Lindi Adie. Medical record requests faxed to Dr. Beverely Pace (oncologist) at [phone], [place] at [phone], and Dr. Herold Harms (surgeon) at [phone]. Receipt confirmations received.

## 2022-08-02 NOTE — Telephone Encounter (Signed)
Scheduled appt per 2/9 secure chat with Wilber Bihari. Per provider okay to schedule in slot. Also per provider, pt is already aware of appt date/time.

## 2022-08-04 ENCOUNTER — Other Ambulatory Visit: Payer: Self-pay | Admitting: Family Medicine

## 2022-08-04 DIAGNOSIS — Z853 Personal history of malignant neoplasm of breast: Secondary | ICD-10-CM

## 2022-08-05 ENCOUNTER — Other Ambulatory Visit (HOSPITAL_COMMUNITY): Payer: Self-pay

## 2022-08-05 MED ORDER — LETROZOLE 2.5 MG PO TABS
2.5000 mg | ORAL_TABLET | Freq: Every day | ORAL | 1 refills | Status: DC
  Filled 2022-08-05: qty 90, 90d supply, fill #0

## 2022-08-14 ENCOUNTER — Encounter: Payer: Self-pay | Admitting: *Deleted

## 2022-08-14 ENCOUNTER — Inpatient Hospital Stay: Payer: Medicare HMO | Attending: Hematology and Oncology | Admitting: Hematology and Oncology

## 2022-08-14 NOTE — Assessment & Plan Note (Deleted)
06/23/2012:Bilateral mastectomies: 3 right sentinel lymph nodes negative, left axillary lymph nodes negative Right breast: Poorly differentiated IDC 3.8 cm ER 100% PR 90% HER2 negative T2 N0 stage IIa with negative margins T2 N0 M0 stage IIa February 2014-July 2014: Adjuvant chemo with Adriamycin Cytoxan followed by Taxol  Current treatment: Antiestrogen therapy started July 2014 Toxicities:  Breast cancer surveillance: No role of mammograms since she had bilateral mastectomies but she had a mammogram and ultrasound on 01/11/2022: Completely fatty breasts with no suspicious lumps or nodules. Bone density 09/20/2019: Osteopenia

## 2022-08-14 NOTE — Progress Notes (Signed)
Pt was a no show to her new pt appt today.  RN sent message to new pt scheduler to reschedule appt.  RN also provided new pt scheduler with pt records to be scanned into epic.

## 2022-08-15 ENCOUNTER — Telehealth: Payer: Self-pay | Admitting: Hematology and Oncology

## 2022-08-15 NOTE — Telephone Encounter (Signed)
R/s pt's appt. Pt is aware of new appt date/time

## 2022-08-21 ENCOUNTER — Encounter: Payer: Self-pay | Admitting: Family Medicine

## 2022-08-21 DIAGNOSIS — Z20828 Contact with and (suspected) exposure to other viral communicable diseases: Secondary | ICD-10-CM

## 2022-08-21 MED ORDER — OSELTAMIVIR PHOSPHATE 75 MG PO CAPS
75.0000 mg | ORAL_CAPSULE | Freq: Every day | ORAL | 0 refills | Status: DC
  Filled 2022-08-21: qty 10, 10d supply, fill #0

## 2022-08-21 NOTE — Telephone Encounter (Signed)
Pt requesting Tamiflu for flu exposure, should I advise that a visit is necessary?

## 2022-08-22 ENCOUNTER — Other Ambulatory Visit (HOSPITAL_COMMUNITY): Payer: Self-pay

## 2022-08-29 ENCOUNTER — Telehealth: Payer: Self-pay

## 2022-08-29 NOTE — Telephone Encounter (Signed)
Called to find out whom patient sees for eye care and when last visit was so I may request records

## 2022-09-03 ENCOUNTER — Other Ambulatory Visit: Payer: Self-pay | Admitting: Family Medicine

## 2022-09-03 DIAGNOSIS — Z13820 Encounter for screening for osteoporosis: Secondary | ICD-10-CM

## 2022-09-03 DIAGNOSIS — E2839 Other primary ovarian failure: Secondary | ICD-10-CM

## 2022-09-04 ENCOUNTER — Other Ambulatory Visit (HOSPITAL_COMMUNITY): Payer: Self-pay

## 2022-09-05 DIAGNOSIS — G4733 Obstructive sleep apnea (adult) (pediatric): Secondary | ICD-10-CM | POA: Diagnosis not present

## 2022-09-19 ENCOUNTER — Other Ambulatory Visit (HOSPITAL_COMMUNITY): Payer: Self-pay

## 2022-09-19 ENCOUNTER — Other Ambulatory Visit: Payer: Self-pay

## 2022-09-23 ENCOUNTER — Other Ambulatory Visit (HOSPITAL_COMMUNITY): Payer: Self-pay

## 2022-09-23 ENCOUNTER — Inpatient Hospital Stay: Payer: Medicare HMO | Attending: Hematology and Oncology | Admitting: Hematology and Oncology

## 2022-09-23 ENCOUNTER — Other Ambulatory Visit: Payer: Self-pay

## 2022-09-23 VITALS — BP 131/76 | HR 71 | Temp 97.4°F | Resp 18 | Ht 67.0 in | Wt 195.9 lb

## 2022-09-23 DIAGNOSIS — Z79811 Long term (current) use of aromatase inhibitors: Secondary | ICD-10-CM | POA: Insufficient documentation

## 2022-09-23 DIAGNOSIS — Z9013 Acquired absence of bilateral breasts and nipples: Secondary | ICD-10-CM | POA: Diagnosis not present

## 2022-09-23 DIAGNOSIS — Z800 Family history of malignant neoplasm of digestive organs: Secondary | ICD-10-CM | POA: Insufficient documentation

## 2022-09-23 DIAGNOSIS — Z853 Personal history of malignant neoplasm of breast: Secondary | ICD-10-CM

## 2022-09-23 DIAGNOSIS — Z79899 Other long term (current) drug therapy: Secondary | ICD-10-CM | POA: Insufficient documentation

## 2022-09-23 DIAGNOSIS — Z170 Estrogen receptor positive status [ER+]: Secondary | ICD-10-CM | POA: Insufficient documentation

## 2022-09-23 DIAGNOSIS — C50111 Malignant neoplasm of central portion of right female breast: Secondary | ICD-10-CM | POA: Insufficient documentation

## 2022-09-23 MED ORDER — LETROZOLE 2.5 MG PO TABS
2.5000 mg | ORAL_TABLET | Freq: Every day | ORAL | 3 refills | Status: DC
  Filled 2022-09-23: qty 90, 90d supply, fill #0
  Filled 2023-02-03: qty 90, 90d supply, fill #1
  Filled 2023-05-05: qty 90, 90d supply, fill #2
  Filled 2023-08-04: qty 90, 90d supply, fill #3

## 2022-09-23 NOTE — Assessment & Plan Note (Addendum)
04/24/2022: Right breast suspicious abnormality 1.2 cm biopsy: IDC with DCIS, subareolar lesion: DCIS ER 100%, PR 90%, HER2 negative, poorly differentiated 06/23/2022: Bilateral mastectomies: Right breast: Poorly differentiated IDC 3.8 cm ER 100%, PR 90%, HER2 negative, margins negative, T2 N0 M0 stage IIa 0/3 lymph nodes, left breast: Benign February 2014-July 2014: [facility] Taxol July 2014: Started antiestrogen therapy (The treatment was given at [facility] hematology oncology) Dr. Berline Chough  Breast cancer surveillance: Breast exam 09/23/2022: Benign Mammogram

## 2022-09-23 NOTE — Progress Notes (Signed)
Mobile NOTE  Patient Care Team: Brittany Agreste, MD as PCP - General (Family Medicine) Brittany Margarita, MD as PCP - Cardiology (Cardiology)  CHIEF COMPLAINTS/PURPOSE OF CONSULTATION:  Prior history of breast cancer, moving from [place] to [name]:  Brittany Vincent 67 y.o. female is here because of a prior history of right breast cancer diagnosed in 2013.  She underwent bilateral mastectomies followed by adjuvant chemotherapy and has been on oral antiestrogen therapy with letrozole for the past 10 years.  She has moved to [facility] and wishes to establish oncology care with [place].  I reviewed her records extensively and collaborated the history with the patient.  SUMMARY OF ONCOLOGIC HISTORY: Oncology History  Malignant neoplasm of central portion of right breast in female, estrogen receptor positive  04/2012 Initial Diagnosis   Screening mammogram showed suspicious abnormality right breast 1.2 cm biopsy IDC with DCIS ER 100%, PR 90%, HER2 negative, breast MRI revealed the breast cancer spread to the nipple 3 cm with non-mass enhancement 5 cm prominent low right axillary lymph nodes   06/23/2012 Surgery   Bilateral mastectomies: 3 right sentinel lymph nodes negative, left axillary lymph nodes negative Right breast: Poorly differentiated IDC 3.8 cm ER 100% PR 90% HER2 negative T2 N0 stage IIa with negative margins (Treatment was done at [facility])   07/2012 - 12/2012 Chemotherapy   Adjuvant chemotherapy with dose dense Adriamycin and Cytoxan followed by Taxol   12/2012 -  Anti-estrogen oral therapy   Adjuvant antiestrogen therapy      MEDICAL HISTORY:  Past Medical History:  Diagnosis Date   Agatston coronary artery calcium score less than 100    44.7 on scan 03/2022   Breast cancer ([place]) 2013   Depression    Family history of malignant neoplasm of digestive organ    Family history of malignant neoplasm  of ovary    Family history of melanoma    Family history of uterine cancer    Fatty liver    Hyperlipidemia    Hypertension    Infertility, female    Lactose intolerance    Obese    Personal history of malignant neoplasm of breast    Pre-diabetes    Shortness of breath on exertion    Sleep apnea    Thyroid disease     SURGICAL HISTORY: Past Surgical History:  Procedure Laterality Date   BREAST SURGERY     BUNIONECTOMY Right    HERNIA REPAIR     REFRACTIVE SURGERY      SOCIAL HISTORY: Social History   Socioeconomic History   Marital status: Married    Spouse name: Not on file   Number of children: 3   Years of education: Not on file   Highest education level: Not on file  Occupational History   Occupation: Therapist, sports   Occupation: Telephonic nursing  Tobacco Use   Smoking status: Never   Smokeless tobacco: Never  Vaping Use   Vaping Use: Never used  Substance and Sexual Activity   Alcohol use: No   Drug use: No   Sexual activity: Not on file  Other Topics Concern   Not on file  Social History Narrative   Lives with her husband.   Older son lives in [place], [place] with his wife and their 2 children.   Younger Nature conservation officer) son lives in [place], with his wife Brittany Vincent) and their daughter, Brittany Vincent.   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   Daughter recently moved to [place].   Moved to [place] from [place], [place] to be nearer to [place], [place] and Brittany Vincent.   Social Determinants of Health   Financial Resource Strain: Low Risk  (05/09/2022)   Overall Financial Resource Strain (CARDIA)    Difficulty of Paying Living Expenses: Not hard at all  Food Insecurity: No Food Insecurity (05/09/2022)   Hunger Vital Sign    Worried About Running Out of Food in the Last Year: Never true    Ran Out of Food in the Last Year: Never true  Transportation Needs: No Transportation Needs (05/09/2022)   PRAPARE - Hydrologist (Medical): No    Lack  of Transportation (Non-Medical): No  Physical Activity: Inactive (05/09/2022)   Exercise Vital Sign    Days of Exercise per Week: 0 days    Minutes of Exercise per Session: 0 min  Stress: No Stress Concern Present (05/09/2022)   [place]    Feeling of Stress : Not at all  Social Connections: Moderately Isolated (05/09/2022)   Social Connection and Isolation Panel [NHANES]    Frequency of Communication with Friends and Family: More than three times a week    Frequency of Social Gatherings with Friends and Family: More than three times a week    Attends Religious Services: Never    Marine scientist or Organizations: No    Attends Archivist Meetings: Never    Marital Status: Married  Human resources officer Violence: Not At Risk (05/09/2022)   Humiliation, Afraid, Rape, and Kick questionnaire    Fear of Current or Ex-Partner: No    Emotionally Abused: No    Physically Abused: No    Sexually Abused: No    FAMILY HISTORY: Family History  Problem Relation Age of Onset   Cancer Mother    Hyperlipidemia Mother    Heart disease Mother    Cancer Father    Stroke Maternal Grandmother    Colon cancer Maternal Grandmother    Cancer Maternal Grandfather    Heart disease Paternal Grandmother    Stroke Paternal Grandfather    Colon cancer Maternal Aunt     ALLERGIES:  is allergic to no known allergies.  MEDICATIONS:  Current Outpatient Medications  Medication Sig Dispense Refill   blood glucose meter kit and supplies Dispense based on patient and insurance preference. Use up to four times daily as directed. (FOR ICD-10 E10.9, E11.9). 1 each 0   Cholecalciferol (VITAMIN D) 125 MCG (5000 UT) CAPS Take by mouth. (Patient not taking: Reported on 05/09/2022)     diltiazem (CARTIA XT) 180 MG 24 hr capsule Take 1 capsule (180 mg total) by mouth daily. 90 capsule 3   doxylamine, Sleep, (UNISOM) 25 MG tablet Take 25  mg by mouth at bedtime as needed for sleep. 1 nightly     escitalopram (LEXAPRO) 20 MG tablet Take 1 tablet (20 mg total) by mouth daily. 90 tablet 1   fenofibrate (TRICOR) 48 MG tablet Take 1 tablet (48 mg total) by mouth daily. 90 tablet 3   letrozole (FEMARA) 2.5 MG tablet Take 1 tablet (2.5 mg total) by mouth daily. 90 tablet 1   levothyroxine (SYNTHROID) 137 MCG tablet Take 1 tablet (137 mcg total) by mouth daily before breakfast. 90 tablet 1   losartan (COZAAR) 25 MG tablet Take 1 tablet (25 mg total) by mouth daily. 90 tablet 3  metFORMIN (GLUCOPHAGE) 500 MG tablet Take 1 tablet (500 mg total) by mouth 2 (two) times daily. 180 tablet 1   oseltamivir (TAMIFLU) 75 MG capsule Take 1 capsule (75 mg total) by mouth daily. 10 capsule 0   rosuvastatin (CRESTOR) 40 MG tablet Take 1 tablet (40 mg total) by mouth daily. 90 tablet 1   No current facility-administered medications for this visit.    REVIEW OF SYSTEMS:   Constitutional: Denies fevers, chills or abnormal night sweats Breast: Bilateral mastectomies without any lumps or nodules All other systems were reviewed with the patient and are negative.  PHYSICAL EXAMINATION: ECOG PERFORMANCE STATUS: 0 - Asymptomatic  Vitals:   09/23/22 1554  BP: 131/76  Pulse: 71  Resp: 18  Temp: (!) 97.4 F (36.3 C)  SpO2: 100%   Filed Weights   09/23/22 1554  Weight: 195 lb 14.4 oz (88.9 kg)    GENERAL:alert, no distress and comfortable BREAST: No palpable lumps or nodules in bilateral chest wall or axilla. No palpable axillary or supraclavicular lymphadenopathy (exam performed in the presence of a chaperone)   LABORATORY DATA:  I have reviewed the data as listed Lab Results  Component Value Date   WBC 7.4 10/24/2020   HGB 15.0 10/24/2020   HCT 45.6 10/24/2020   MCV 99.8 10/24/2020   PLT 289 10/24/2020   Lab Results  Component Value Date   NA 139 07/15/2022   K 4.6 07/15/2022   CL 103 07/15/2022   CO2 28 07/15/2022     RADIOGRAPHIC STUDIES: I have personally reviewed the radiological reports and agreed with the findings in the report.  ASSESSMENT AND PLAN:  Malignant neoplasm of central portion of right breast in female, estrogen receptor positive ([place]) 04/24/2022: Right breast suspicious abnormality 1.2 cm biopsy: IDC with DCIS, subareolar lesion: DCIS ER 100%, PR 90%, HER2 negative, poorly differentiated 06/23/2022: Bilateral mastectomies: Right breast: Poorly differentiated IDC 3.8 cm ER 100%, PR 90%, HER2 negative, margins negative, T2 N0 M0 stage IIa 0/3 lymph nodes, left breast: Benign February 2014-July 2014: [facility] Taxol July 2014: Started antiestrogen therapy (The treatment was given at [facility] hematology oncology) Dr. Berline Chough  Letrozole toxicities: None Patient has been on oral antiestrogen therapy for 10 years.  I discussed with her about the pros and cons of extended endocrine therapy.  There is no clinical data to support continuation of antiestrogen therapy but patient wishes to stay on it and she understands the risks and benefits and we decided to continue on with the letrozole indefinitely.  We discussed the risk of coronary artery disease as well as osteoporosis.  Breast cancer surveillance: Breast exam 09/23/2022: Benign Mammogram: Patient was getting annual mammograms and ultrasounds at [facility].  I discussed with her that since she had bilateral mastectomies there is no role for mammograms or ultrasounds.  Ultrasounds can be done if there is any palpable abnormality. There is no role of tumor markers a routine blood test. We discussed the role of [place] and we will try it and see if she is still has pathology available for [place] to conduct this test.  She understands that there is a possibility that there may not be any tissue available for running this test.  Return to clinic in 1 year for follow-up All questions were answered. The patient knows to call the clinic with any  problems, questions or concerns.    Harriette Ohara, MD 09/23/22

## 2022-09-27 ENCOUNTER — Encounter: Payer: Self-pay | Admitting: *Deleted

## 2022-09-27 NOTE — Progress Notes (Signed)
Signatera testing unable to be preformed due to original path being over 10 years out.  Pt notified and verbalized understanding.

## 2022-10-07 ENCOUNTER — Other Ambulatory Visit (HOSPITAL_COMMUNITY): Payer: Self-pay

## 2022-10-10 NOTE — Addendum Note (Signed)
Addended by: Virl Axe, Ji Fairburn L on: 10/10/2022 12:32 PM   Modules accepted: Orders

## 2022-10-10 NOTE — Addendum Note (Signed)
Addended by: Virl Axe, Saran Laviolette L on: 10/10/2022 09:58 AM   Modules accepted: Orders

## 2022-10-11 ENCOUNTER — Other Ambulatory Visit: Payer: Self-pay

## 2022-10-11 ENCOUNTER — Other Ambulatory Visit (HOSPITAL_COMMUNITY): Payer: Self-pay

## 2022-10-17 ENCOUNTER — Ambulatory Visit: Payer: Medicare HMO

## 2022-10-18 ENCOUNTER — Ambulatory Visit: Payer: Medicare HMO | Attending: Cardiology

## 2022-10-18 DIAGNOSIS — E782 Mixed hyperlipidemia: Secondary | ICD-10-CM

## 2022-10-18 DIAGNOSIS — E785 Hyperlipidemia, unspecified: Secondary | ICD-10-CM | POA: Diagnosis not present

## 2022-10-19 LAB — LIPID PANEL
Chol/HDL Ratio: 3 ratio (ref 0.0–4.4)
Cholesterol, Total: 148 mg/dL (ref 100–199)
HDL: 49 mg/dL (ref >39)
LDL Chol Calc (NIH): 74 mg/dL (ref 0–99)
Triglycerides: 141 mg/dL (ref 0–149)
VLDL Cholesterol Cal: 25 mg/dL (ref 5–40)

## 2022-10-19 LAB — ALT: ALT: 21 IU/L (ref 0–32)

## 2022-10-21 ENCOUNTER — Other Ambulatory Visit (HOSPITAL_COMMUNITY): Payer: Self-pay

## 2022-10-21 ENCOUNTER — Telehealth: Payer: Self-pay

## 2022-10-21 ENCOUNTER — Other Ambulatory Visit: Payer: Self-pay

## 2022-10-21 DIAGNOSIS — E782 Mixed hyperlipidemia: Secondary | ICD-10-CM

## 2022-10-21 MED ORDER — METRONIDAZOLE 500 MG PO TABS
2000.0000 mg | ORAL_TABLET | Freq: Once | ORAL | 0 refills | Status: DC
  Filled 2022-10-21: qty 4, 1d supply, fill #0

## 2022-10-21 MED ORDER — EZETIMIBE 10 MG PO TABS
10.0000 mg | ORAL_TABLET | Freq: Every day | ORAL | 3 refills | Status: DC
  Filled 2022-10-21: qty 90, 90d supply, fill #0
  Filled 2023-01-23: qty 90, 90d supply, fill #1
  Filled 2023-04-20: qty 90, 90d supply, fill #2
  Filled 2023-08-04: qty 90, 90d supply, fill #3

## 2022-10-21 NOTE — Telephone Encounter (Signed)
-----   Message from Quintella Reichert, MD sent at 10/20/2022  7:39 PM EDT ----- LDL is not at goal - please find out if he has missed any doses of Crestor and if no then add Zetia 10mg  daily and get FLP and alt in 8 weeks

## 2022-12-18 ENCOUNTER — Ambulatory Visit: Payer: Medicare HMO | Attending: Cardiology

## 2022-12-18 DIAGNOSIS — E782 Mixed hyperlipidemia: Secondary | ICD-10-CM | POA: Diagnosis not present

## 2022-12-18 LAB — LIPID PANEL
Chol/HDL Ratio: 2.4 ratio (ref 0.0–4.4)
Cholesterol, Total: 111 mg/dL (ref 100–199)
HDL: 46 mg/dL (ref >39)
LDL Chol Calc (NIH): 45 mg/dL (ref 0–99)
Triglycerides: 111 mg/dL (ref 0–149)
VLDL Cholesterol Cal: 20 mg/dL (ref 5–40)

## 2022-12-18 LAB — ALT: ALT: 24 IU/L (ref 0–32)

## 2022-12-19 ENCOUNTER — Encounter (HOSPITAL_BASED_OUTPATIENT_CLINIC_OR_DEPARTMENT_OTHER): Payer: Self-pay | Admitting: Cardiology

## 2023-01-13 ENCOUNTER — Ambulatory Visit: Payer: Medicare HMO | Admitting: Family Medicine

## 2023-01-20 ENCOUNTER — Ambulatory Visit: Payer: Medicare HMO | Admitting: Family Medicine

## 2023-01-22 ENCOUNTER — Encounter: Payer: Self-pay | Admitting: Family Medicine

## 2023-01-22 ENCOUNTER — Other Ambulatory Visit (HOSPITAL_COMMUNITY): Payer: Self-pay

## 2023-01-22 ENCOUNTER — Ambulatory Visit (INDEPENDENT_AMBULATORY_CARE_PROVIDER_SITE_OTHER): Payer: Medicare HMO | Admitting: Family Medicine

## 2023-01-22 VITALS — BP 120/62 | HR 65 | Temp 98.0°F | Ht 67.0 in | Wt 195.0 lb

## 2023-01-22 DIAGNOSIS — E039 Hypothyroidism, unspecified: Secondary | ICD-10-CM | POA: Diagnosis not present

## 2023-01-22 DIAGNOSIS — Z853 Personal history of malignant neoplasm of breast: Secondary | ICD-10-CM

## 2023-01-22 DIAGNOSIS — F418 Other specified anxiety disorders: Secondary | ICD-10-CM

## 2023-01-22 DIAGNOSIS — E119 Type 2 diabetes mellitus without complications: Secondary | ICD-10-CM

## 2023-01-22 DIAGNOSIS — E1165 Type 2 diabetes mellitus with hyperglycemia: Secondary | ICD-10-CM | POA: Diagnosis not present

## 2023-01-22 DIAGNOSIS — E785 Hyperlipidemia, unspecified: Secondary | ICD-10-CM

## 2023-01-22 MED ORDER — ESCITALOPRAM OXALATE 20 MG PO TABS
20.0000 mg | ORAL_TABLET | Freq: Every day | ORAL | 1 refills | Status: DC
Start: 2023-01-22 — End: 2023-08-04
  Filled 2023-01-22: qty 90, 90d supply, fill #0
  Filled 2023-05-05: qty 90, 90d supply, fill #1

## 2023-01-22 MED ORDER — LEVOTHYROXINE SODIUM 137 MCG PO TABS
137.0000 ug | ORAL_TABLET | Freq: Every day | ORAL | 1 refills | Status: DC
Start: 2023-01-22 — End: 2023-09-24
  Filled 2023-01-22 – 2023-03-29 (×2): qty 90, 90d supply, fill #0
  Filled 2023-06-27: qty 90, 90d supply, fill #1

## 2023-01-22 MED ORDER — ROSUVASTATIN CALCIUM 40 MG PO TABS
40.0000 mg | ORAL_TABLET | Freq: Every day | ORAL | 1 refills | Status: DC
Start: 2023-01-22 — End: 2023-09-24
  Filled 2023-01-22 – 2023-04-20 (×2): qty 90, 90d supply, fill #0
  Filled 2023-08-04: qty 90, 90d supply, fill #1

## 2023-01-22 MED ORDER — METFORMIN HCL 500 MG PO TABS
500.0000 mg | ORAL_TABLET | Freq: Two times a day (BID) | ORAL | 1 refills | Status: DC
  Filled 2023-01-22: qty 180, 90d supply, fill #0
  Filled 2023-05-05: qty 180, 90d supply, fill #1

## 2023-01-22 NOTE — Patient Instructions (Addendum)
No med changes at this time.  If any concerns on labs I will let you know.  Take care!

## 2023-01-22 NOTE — Progress Notes (Signed)
Subjective:  Patient ID: Brittany Vincent, female    DOB: Nov 14, 1955  Age: 67 y.o. MRN: [id number]  CC:  Chief Complaint  Patient presents with   Medical Management of Chronic Issues    HPI Brittany Vincent presents for   Situational anxiety Lexapro 20 mg daily. Still working well. Managing stressors effectively.  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 Going well. 2 shifts per week.     01/22/2023    3:59 PM 11/15/2020    2:58 PM  GAD 7 : Generalized Anxiety Score  Nervous, Anxious, on Edge 0 0  Control/stop worrying 0 0  Worry too much - different things 0 0  Trouble relaxing 0 0  Restless 0 0  Easily annoyed or irritable 0 1  Afraid - awful might happen 0 0  Total GAD 7 Score 0 1  Anxiety Difficulty Not difficult at all        07/15/2022    1:46 PM 05/09/2022    1:45 PM 01/09/2022    2:37 PM 07/05/2021    2:40 PM 01/11/2021    9:38 AM  Depression screen PHQ 2/9  Decreased Interest 0 0 0 0 0  Down, Depressed, Hopeless 0 0 0 0 0  PHQ - 2 Score 0 0 0 0 0  Altered sleeping 0 0  1 3  Tired, decreased energy 0 0  0 3  Change in appetite 0 0  0 3  Feeling bad or failure about yourself  0 0  0 0  Trouble concentrating  0  0 0  Moving slowly or fidgety/restless 0 0  0 0  Suicidal thoughts 0 0  0 0  PHQ-9 Score 0 0  1 9  Difficult doing work/chores  Not difficult at all   Not difficult at all   Hypothyroidism: Lab Results  Component Value Date   TSH 1.07 07/15/2022  Synthroid 137 mcg daily.  Taking medication daily.  No new hot or cold intolerance. No new hair or skin changes, heart palpitations or new fatigue. No new weight changes.   Hypertension: Diltiazem 180 mg daily, losartan 25 mg daily. Home readings: none.  BP Readings from Last 3 Encounters:  01/22/23 120/62  09/23/22 131/76  07/15/22 114/60   Lab Results  Component Value Date   CREATININE 0.70 07/15/2022   ,Hyperlipidemia: With prior elevated coronary  calcium scoring.  Has been seen by cardiology, Dr. Mayford Knife,  treated with Crestor 40 mg daily with additional 48 mg fenofibrate and Zetia 10mg  every day.  Denies new myalgias with combo. Recent labs noted Lab Results  Component Value Date   CHOL 111 12/18/2022   HDL 46 12/18/2022   LDLCALC 45 12/18/2022   LDLDIRECT 100.0 11/15/2020   TRIG 111 12/18/2022   CHOLHDL 2.4 12/18/2022   Lab Results  Component Value Date   ALT 24 12/18/2022   AST 21 07/15/2022   ALKPHOS 48 07/15/2022   BILITOT 0.4 07/15/2022   Diabetes: Metformin 500 mg twice daily.  She is on ARB and statin. No new side effects with meds Home readings fasting: none Postprandial: none. No symptomatic lows.  Microalbumin: Normal ratio July 2023, repeat today. Optho, foot exam, pneumovax:  Has been seen by ophthalmologist previously.  Records have been requested. DEXA scan due in September.  Order pending. History of breast cancer, appointment with oncology April 1 noted.  No role for mammograms with history of bilateral mastectomy.  Status post  10 years of antiestrogen therapy.  Risks versus benefits of continuation were discussed and plans to stay on letrozole indefinitely.  CAD and osteoporosis risks discussed, DEXA scan planned as above.  1 year follow-up with oncology. Prior mammograms and ultrasound, but with bilateral mastectomies not thought to need mammogarm or ultrasound unless palpable abnormality.   Lab Results  Component Value Date   HGBA1C 6.7 (H) 07/15/2022   HGBA1C 6.5 01/16/2022   HGBA1C 6.3 07/09/2021   Lab Results  Component Value Date   MICROALBUR 1.0 01/09/2022   LDLCALC 45 12/18/2022   CREATININE 0.70 07/15/2022   Wt Readings from Last 3 Encounters:  01/22/23 195 lb (88.5 kg)  09/23/22 195 lb 14.4 oz (88.9 kg)  07/15/22 193 lb 3.2 oz (87.6 kg)            History Patient Active Problem List   Diagnosis Date Noted   Agatston coronary artery calcium score less than 100 04/07/2022    Type 2 diabetes mellitus with hyperglycemia, without long-term current use of insulin ([name]) 02/17/2021   OSA (obstructive sleep apnea) 02/15/2021   Class 1 obesity with serious comorbidity and body mass index (BMI) of 31.0 to 31.9 in adult 02/15/2021   Genetic testing 12/22/2020   Family history of malignant neoplasm of ovary 12/13/2020   Family history of malignant neoplasm of digestive organ 12/13/2020   Family history of melanoma 12/13/2020   Family history of uterine cancer 12/13/2020   Personal history of malignant neoplasm of breast 05/01/2016   Hypothyroidism 05/01/2016   Hyperlipidemia 05/01/2016   Malignant neoplasm of central portion of right breast in female, estrogen receptor positive ([name]) 2013   Past Medical History:  Diagnosis Date   Agatston coronary artery calcium score less than 100    44.7 on scan 03/2022   Breast cancer ([name]) 2013   Depression    Family history of malignant neoplasm of digestive organ    Family history of malignant neoplasm of ovary    Family history of melanoma    Family history of uterine cancer    Fatty liver    Hyperlipidemia    Hypertension    Infertility, female    Lactose intolerance    Obese    Personal history of malignant neoplasm of breast    Pre-diabetes    Shortness of breath on exertion    Sleep apnea    Thyroid disease    Past Surgical History:  Procedure Laterality Date   BREAST SURGERY     BUNIONECTOMY Right    HERNIA REPAIR     REFRACTIVE SURGERY     Allergies  Allergen Reactions   No Known Allergies    Prior to Admission medications   Medication Sig Start Date End Date Taking? Authorizing Provider  blood glucose meter kit and supplies Dispense based on patient and insurance preference. Use up to four times daily as directed. (FOR ICD-10 E10.9, E11.9). 11/15/19  Yes Shade Flood, MD  diltiazem (CARTIA XT) 180 MG 24 hr capsule Take 1 capsule (180 mg total) by mouth daily. 02/27/22  Yes Turner, Cornelious Bryant, MD   doxylamine, Sleep, (UNISOM) 25 MG tablet Take 25 mg by mouth at bedtime as needed for sleep. 1 nightly   Yes [provider]  escitalopram (LEXAPRO) 20 MG tablet Take 1 tablet (20 mg total) by mouth daily. 07/15/22  Yes Shade Flood, MD  ezetimibe (ZETIA) 10 MG tablet Take 1 tablet (10 mg total) by mouth daily. 10/21/22 01/22/23 Yes  Quintella Reichert, MD  fenofibrate (TRICOR) 48 MG tablet Take 1 tablet (48 mg total) by mouth daily. 04/10/22  Yes Turner, Cornelious Bryant, MD  letrozole (FEMARA) 2.5 MG tablet Take 1 tablet (2.5 mg total) by mouth daily. 09/23/22  Yes Serena Croissant, MD  levothyroxine (SYNTHROID) 137 MCG tablet Take 1 tablet (137 mcg total) by mouth daily before breakfast. 07/15/22  Yes Shade Flood, MD  losartan (COZAAR) 25 MG tablet Take 1 tablet (25 mg total) by mouth daily. 03/12/22  Yes Turner, Cornelious Bryant, MD  metFORMIN (GLUCOPHAGE) 500 MG tablet Take 1 tablet (500 mg total) by mouth 2 (two) times daily. 07/10/22  Yes Shade Flood, MD  rosuvastatin (CRESTOR) 40 MG tablet Take 1 tablet (40 mg total) by mouth daily. 07/15/22  Yes Shade Flood, MD  Cholecalciferol (VITAMIN D) 125 MCG (5000 UT) CAPS Take by mouth. Patient not taking: Reported on 05/09/2022    [provider]   Social History   Socioeconomic History   Marital status: Married    Spouse name: Not on file   Number of children: 3   Years of education: Not on file   Highest education level: Not on file  Occupational History   Occupation: Charity fundraiser   Occupation: Telephonic nursing  Tobacco Use   Smoking status: Never   Smokeless tobacco: Never  Vaping Use   Vaping status: Never Used  Substance and Sexual Activity   Alcohol use: No   Drug use: No   Sexual activity: Not on file  Other Topics Concern   Not on file  Social History Narrative   Lives with her husband.   Older son lives in [place], [place] with his wife and their 2 children.   Younger Theatre stage manager) son lives in [place], with his wife Kriste Basque) and  their daughter, Ava.   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   Daughter recently moved to [place].   Moved to [place] from [place], [place] to be nearer to [place], [place] and Ava.   Social Determinants of Health   Financial Resource Strain: Low Risk  (05/09/2022)   Overall Financial Resource Strain (CARDIA)    Difficulty of Paying Living Expenses: Not hard at all  Food Insecurity: No Food Insecurity (05/09/2022)   Hunger Vital Sign    Worried About Running Out of Food in the Last Year: Never true    Ran Out of Food in the Last Year: Never true  Transportation Needs: No Transportation Needs (05/09/2022)   PRAPARE - Administrator, Civil Service (Medical): No    Lack of Transportation (Non-Medical): No  Physical Activity: Inactive (05/09/2022)   Exercise Vital Sign    Days of Exercise per Week: 0 days    Minutes of Exercise per Session: 0 min  Stress: No Stress Concern Present (05/09/2022)   [employer] of Occupational Health - Occupational Stress Questionnaire    Feeling of Stress : Not at all  Social Connections: Moderately Isolated (05/09/2022)   Social Connection and Isolation Panel [NHANES]    Frequency of Communication with Friends and Family: More than three times a week    Frequency of Social Gatherings with Friends and Family: More than three times a week    Attends Religious Services: Never    Database administrator or Organizations: No    Attends Banker Meetings: Never    Marital Status: Married  Catering manager Violence: Not At Risk (05/09/2022)   Humiliation,  Afraid, Rape, and Kick questionnaire    Fear of Current or Ex-Partner: No    Emotionally Abused: No    Physically Abused: No    Sexually Abused: No    Review of Systems  Constitutional:  Negative for fatigue and unexpected weight change.  Respiratory:  Negative for chest tightness and shortness of breath.   Cardiovascular:  Negative for chest pain,  palpitations and leg swelling.  Gastrointestinal:  Negative for abdominal pain and blood in stool.  Neurological:  Negative for dizziness, syncope, light-headedness and headaches.     Objective:   Vitals:   01/22/23 1512  BP: 120/62  Pulse: 65  Temp: 98 F (36.7 C)  TempSrc: Temporal  SpO2: 95%  Weight: 195 lb (88.5 kg)  Height: 5\' 7"  (1.702 m)     Physical Exam Vitals reviewed.  Constitutional:      Appearance: Normal appearance. She is well-developed.  HENT:     Head: Normocephalic and atraumatic.  Eyes:     Conjunctiva/sclera: Conjunctivae normal.     Pupils: Pupils are equal, round, and reactive to light.  Neck:     Vascular: No carotid bruit.  Cardiovascular:     Rate and Rhythm: Normal rate and regular rhythm.     Heart sounds: Normal heart sounds.  Pulmonary:     Effort: Pulmonary effort is normal.     Breath sounds: Normal breath sounds.  Abdominal:     Palpations: Abdomen is soft. There is no pulsatile mass.     Tenderness: There is no abdominal tenderness.  Musculoskeletal:     Right lower leg: No edema.     Left lower leg: No edema.  Skin:    General: Skin is warm and dry.  Neurological:     Mental Status: She is alert and oriented to person, place, and time.  Psychiatric:        Mood and Affect: Mood normal.        Behavior: Behavior normal.        Assessment & Plan:  Brittany Vincent is a 67 y.o. female . Situational anxiety - Plan: escitalopram (LEXAPRO) 20 MG tablet  -Tolerating current dose of Lexapro, continue same, symptoms stable.  Type 2 diabetes mellitus without complication, without long-term current use of insulin ([name]) - Plan: Comprehensive metabolic panel, Hemoglobin A1c, metFORMIN (GLUCOPHAGE) 500 MG tablet  -Check A1c, labs as above, continue same dose metformin for now with adjustment of plan based on results.  Hyperlipidemia, unspecified hyperlipidemia type - Plan: Comprehensive metabolic panel, rosuvastatin (CRESTOR) 40 MG  tablet  -Tolerating Crestor, Zetia, fenofibrate.  Continue same, but with check of labs as above.  Continue follow-up with cardiology as planned  Hypothyroidism, unspecified type - Plan: TSH, levothyroxine (SYNTHROID) 137 MCG tablet  -Asymptomatic with current dose, check labs. Continue same dose Synthroid for now.  Type 2 diabetes mellitus with hyperglycemia, without long-term current use of insulin ([name])   - As above, continue metformin same dose.  History of breast cancer  -Visit with oncology with discussion of mammogram, ultrasound and with prior mastectomy, should not need ongoing mammograms.  She plans to discuss with her previous oncologist for other opinion but I agree with no testing at this time unless indicated by oncology, or if any new symptoms would consider ultrasound.  Meds ordered this encounter  Medications   escitalopram (LEXAPRO) 20 MG tablet    Sig: Take 1 tablet (20 mg total) by mouth daily.    Dispense:  90 tablet  Refill:  1   levothyroxine (SYNTHROID) 137 MCG tablet    Sig: Take 1 tablet (137 mcg total) by mouth daily before breakfast.    Dispense:  90 tablet    Refill:  1   metFORMIN (GLUCOPHAGE) 500 MG tablet    Sig: Take 1 tablet (500 mg total) by mouth 2 (two) times daily.    Dispense:  180 tablet    Refill:  1   rosuvastatin (CRESTOR) 40 MG tablet    Sig: Take 1 tablet (40 mg total) by mouth daily.    Dispense:  90 tablet    Refill:  1   Patient Instructions  No med changes at this time.  If any concerns on labs I will let you know.  Take care!    Signed,   Meredith Staggers, MD [facility], [facility] 01/22/23 4:09 PM

## 2023-01-29 ENCOUNTER — Encounter: Payer: Self-pay | Admitting: Gastroenterology

## 2023-02-27 ENCOUNTER — Ambulatory Visit
Admission: RE | Admit: 2023-02-27 | Discharge: 2023-02-27 | Disposition: A | Discharge status: Home / Self Care | Payer: Medicare HMO | Source: Ambulatory Visit | Attending: Family Medicine | Admitting: Family Medicine

## 2023-02-27 DIAGNOSIS — Z13820 Encounter for screening for osteoporosis: Secondary | ICD-10-CM

## 2023-02-27 DIAGNOSIS — E349 Endocrine disorder, unspecified: Secondary | ICD-10-CM | POA: Diagnosis not present

## 2023-02-27 DIAGNOSIS — M8588 Other specified disorders of bone density and structure, other site: Secondary | ICD-10-CM | POA: Diagnosis not present

## 2023-02-27 DIAGNOSIS — Z853 Personal history of malignant neoplasm of breast: Secondary | ICD-10-CM | POA: Diagnosis not present

## 2023-02-27 DIAGNOSIS — E2839 Other primary ovarian failure: Secondary | ICD-10-CM

## 2023-02-27 DIAGNOSIS — N958 Other specified menopausal and perimenopausal disorders: Secondary | ICD-10-CM | POA: Diagnosis not present

## 2023-03-04 ENCOUNTER — Encounter: Payer: Self-pay | Admitting: Family Medicine

## 2023-03-11 ENCOUNTER — Other Ambulatory Visit (HOSPITAL_COMMUNITY): Payer: Self-pay

## 2023-03-11 ENCOUNTER — Other Ambulatory Visit (HOSPITAL_BASED_OUTPATIENT_CLINIC_OR_DEPARTMENT_OTHER): Payer: Self-pay | Admitting: Cardiology

## 2023-03-11 MED ORDER — DILTIAZEM HCL ER COATED BEADS 180 MG PO CP24
180.0000 mg | ORAL_CAPSULE | Freq: Every day | ORAL | 0 refills | Status: DC
  Filled 2023-03-11: qty 90, 90d supply, fill #0

## 2023-03-24 DIAGNOSIS — G4733 Obstructive sleep apnea (adult) (pediatric): Secondary | ICD-10-CM | POA: Diagnosis not present

## 2023-03-29 ENCOUNTER — Other Ambulatory Visit: Payer: Self-pay

## 2023-03-29 ENCOUNTER — Other Ambulatory Visit (HOSPITAL_BASED_OUTPATIENT_CLINIC_OR_DEPARTMENT_OTHER): Payer: Self-pay | Admitting: Cardiology

## 2023-03-31 ENCOUNTER — Other Ambulatory Visit: Payer: Self-pay

## 2023-03-31 ENCOUNTER — Other Ambulatory Visit (HOSPITAL_COMMUNITY): Payer: Self-pay

## 2023-04-02 ENCOUNTER — Other Ambulatory Visit (HOSPITAL_COMMUNITY): Payer: Self-pay

## 2023-04-02 MED ORDER — LOSARTAN POTASSIUM 25 MG PO TABS
25.0000 mg | ORAL_TABLET | Freq: Every day | ORAL | 0 refills | Status: DC
  Filled 2023-04-02: qty 30, 30d supply, fill #0

## 2023-04-07 ENCOUNTER — Other Ambulatory Visit: Payer: Self-pay | Admitting: Cardiology

## 2023-04-08 ENCOUNTER — Other Ambulatory Visit (HOSPITAL_COMMUNITY): Payer: Self-pay

## 2023-04-08 DIAGNOSIS — J029 Acute pharyngitis, unspecified: Secondary | ICD-10-CM | POA: Diagnosis not present

## 2023-04-08 DIAGNOSIS — J020 Streptococcal pharyngitis: Secondary | ICD-10-CM | POA: Diagnosis not present

## 2023-04-08 DIAGNOSIS — R509 Fever, unspecified: Secondary | ICD-10-CM | POA: Diagnosis not present

## 2023-04-08 MED ORDER — AMOXICILLIN 875 MG PO TABS
875.0000 mg | ORAL_TABLET | Freq: Two times a day (BID) | ORAL | 0 refills | Status: DC
  Filled 2023-04-08: qty 20, 10d supply, fill #0

## 2023-04-09 ENCOUNTER — Other Ambulatory Visit (HOSPITAL_COMMUNITY): Payer: Self-pay

## 2023-04-09 MED ORDER — FENOFIBRATE 48 MG PO TABS
48.0000 mg | ORAL_TABLET | Freq: Every day | ORAL | 0 refills | Status: DC
  Filled 2023-04-09: qty 90, 90d supply, fill #0

## 2023-04-14 ENCOUNTER — Encounter: Payer: Self-pay | Admitting: Physician Assistant

## 2023-04-14 ENCOUNTER — Ambulatory Visit: Payer: Medicare HMO | Attending: Physician Assistant | Admitting: Physician Assistant

## 2023-04-14 VITALS — BP 123/60 | HR 61 | Ht 66.0 in | Wt 197.4 lb

## 2023-04-14 DIAGNOSIS — G4733 Obstructive sleep apnea (adult) (pediatric): Secondary | ICD-10-CM

## 2023-04-14 DIAGNOSIS — I10 Essential (primary) hypertension: Secondary | ICD-10-CM

## 2023-04-14 DIAGNOSIS — I2510 Atherosclerotic heart disease of native coronary artery without angina pectoris: Secondary | ICD-10-CM | POA: Diagnosis not present

## 2023-04-14 DIAGNOSIS — E782 Mixed hyperlipidemia: Secondary | ICD-10-CM | POA: Diagnosis not present

## 2023-04-14 DIAGNOSIS — I4719 Other supraventricular tachycardia: Secondary | ICD-10-CM | POA: Diagnosis not present

## 2023-04-14 HISTORY — DX: Essential (primary) hypertension: I10

## 2023-04-14 HISTORY — DX: Other supraventricular tachycardia: I47.19

## 2023-04-14 NOTE — Assessment & Plan Note (Signed)
CAC score of 44.7 (70th percentile) from CT in 03/2022. She is doing well w/o chest pain to suggest angina. -Continue Crestor 40mg  daily.

## 2023-04-14 NOTE — Assessment & Plan Note (Signed)
No recurrent palpitations reported, previously associated with work stress. -Continue Diltiazem 180mg  daily.

## 2023-04-14 NOTE — Assessment & Plan Note (Signed)
Patient compliant with CPAP therapy. -Continue CPAP use.

## 2023-04-14 NOTE — Progress Notes (Addendum)
Cardiology Office Note:    Date:  04/14/2023  ID:  Brittany Vincent, DOB 1955-09-24, MRN [id number] PCP: Shade Flood, MD  [facility] Providers Cardiologist:  Armanda Magic, MD       Patient Profile:      Coronary artery Ca2+ ETT 12/14/20: no ischemia CAC score 04/05/22: 44.7 (70th percentile); aortic atherosclerosis Hypertension Hyperlipidemia Paroxysmal atrial tachycardia  TTE 12/06/20: EF 55-60, no RWMA, Gr 1 DD, NL RVSF, trivial MR, RAP 3  Monitor 11/2020: nonsustained ATach, PVCs Diltiazem Rx  Aortic atherosclerosis  Right Bundle Branch Block  Pre-diabetes OSA on CPAP  Breast CA s/p bilat mastectomy, ChemoTX Fatty liver          History of Present Illness:  Discussed the use of AI scribe software for clinical note transcription with the patient, who gave verbal consent to proceed.  Brittany Vincent is a 67 y.o. female who returns for follow-up of coronary calcification, atrial tachycardia.  She was last seen by Dr. Mayford Knife August 2023.  She is here alone. The patient reports no recent palpitations and attributed past episodes to stress from a previous job. She has been using CPAP. She reported no issues with its use. The patient denies any chest discomfort, shortness of breath, passing out, or significant swelling. She reports some swelling if she has been on her feet for an extended period, but nothing significant.      ROS   See HPI    Studies Reviewed:   EKG Interpretation Date/Time:  Monday April 14 2023 15:13:56 EDT Ventricular Rate:  61 PR Interval:  194 QRS Duration:  126 QT Interval:  462 QTC Calculation: 465 R Axis:   -38  Text Interpretation: Normal sinus rhythm Left axis deviation Right bundle branch block Low voltage QRS No significant change since last tracing Confirmed by Tereso Newcomer [phone]) on 04/14/2023 3:27:41 PM     Results   LABS - Chart Review Total cholesterol: 111 mg/dL (84/13/2440) HDL: 46 mg/dL (04/20/2535) Triglycerides: 111 mg/dL  (64/40/3474) LDL: 45 mg/dL (25/95/6387) Potassium: 4.2 mmol/L (01/22/2023) Creatinine: 0.72 mg/dL (56/43/3295) ALT: 20 U/L (01/22/2023) TSH: 0.72 mIU/L (01/22/2023)      Risk Assessment/Calculations:             Physical Exam:   VS:  BP 123/60   Pulse 61   Ht 5\' 6"  (1.676 m)   Wt 197 lb 6.4 oz (89.5 kg)   SpO2 95%   BMI 31.86 kg/m    Wt Readings from Last 3 Encounters:  04/14/23 197 lb 6.4 oz (89.5 kg)  01/22/23 195 lb (88.5 kg)  09/23/22 195 lb 14.4 oz (88.9 kg)    Constitutional:      Appearance: Healthy appearance. Not in distress.  Neck:     Vascular: JVD normal.  Pulmonary:     Breath sounds: Normal breath sounds. No wheezing. No rales.  Cardiovascular:     Normal rate. Regular rhythm.     Murmurs: There is no murmur.  Edema:    Peripheral edema absent.  Abdominal:     Palpations: Abdomen is soft.      Assessment and Plan:   Assessment & Plan Coronary artery calcification seen on CT scan CAC score of 44.7 (70th percentile) from CT in 03/2022. She is doing well w/o chest pain to suggest angina. -Continue Crestor 40mg  daily.  Mixed hyperlipidemia LDL optimal as of 11/2022. -Continue Crestor 40mg , Fenofibrate 48mg , and Zetia 10mg  daily.  Paroxysmal atrial tachycardia ([name]) No recurrent palpitations reported, previously associated  with work stress. -Continue Diltiazem 180mg  daily.  Benign essential HTN Blood pressure controlled. -Continue Cardizem CD 180mg  and Losartan 25mg  daily.  OSA (obstructive sleep apnea) Patient compliant with CPAP therapy. -Continue CPAP use.      Dispo:  Return in about 1 year (around 04/13/2024) for Routine Follow Up, w/ Dr. Mayford Knife.  Signed, Tereso Newcomer, PA-C

## 2023-04-14 NOTE — Patient Instructions (Signed)
Medication Instructions:  Your physician recommends that you continue on your current medications as directed. Please refer to the Current Medication list given to you today.  *If you need a refill on your cardiac medications before your next appointment, please call your pharmacy*   Lab Work: None ordered  If you have labs (blood work) drawn today and your tests are completely normal, you will receive your results only by: MyChart Message (if you have MyChart) OR A paper copy in the mail If you have any lab test that is abnormal or we need to change your treatment, we will call you to review the results.   Testing/Procedures: None ordered   Follow-Up: At [facility], you and your health needs are our priority.  As part of our continuing mission to provide you with exceptional heart care, we have created designated Provider Care Teams.  These Care Teams include your primary Cardiologist (physician) and Advanced Practice Providers (APPs -  Physician Assistants and Nurse Practitioners) who all work together to provide you with the care you need, when you need it.  We recommend signing up for the patient portal called "MyChart".  Sign up information is provided on this After Visit Summary.  MyChart is used to connect with patients for Virtual Visits (Telemedicine).  Patients are able to view lab/test results, encounter notes, upcoming appointments, etc.  Non-urgent messages can be sent to your provider as well.   To learn more about what you can do with MyChart, go to [email or web address].    Your next appointment:   1 year(s)  Provider:   Armanda Magic, MD     Other Instructions

## 2023-04-14 NOTE — Assessment & Plan Note (Signed)
LDL optimal as of 11/2022. -Continue Crestor 40mg , Fenofibrate 48mg , and Zetia 10mg  daily.

## 2023-04-14 NOTE — Assessment & Plan Note (Signed)
Blood pressure controlled. -Continue Cardizem CD 180mg  and Losartan 25mg  daily.

## 2023-04-21 ENCOUNTER — Other Ambulatory Visit (HOSPITAL_COMMUNITY): Payer: Self-pay

## 2023-04-23 DIAGNOSIS — E559 Vitamin D deficiency, unspecified: Secondary | ICD-10-CM | POA: Diagnosis not present

## 2023-04-23 DIAGNOSIS — C50911 Malignant neoplasm of unspecified site of right female breast: Secondary | ICD-10-CM | POA: Diagnosis not present

## 2023-04-23 DIAGNOSIS — G4733 Obstructive sleep apnea (adult) (pediatric): Secondary | ICD-10-CM | POA: Diagnosis not present

## 2023-05-05 ENCOUNTER — Other Ambulatory Visit (HOSPITAL_BASED_OUTPATIENT_CLINIC_OR_DEPARTMENT_OTHER): Payer: Self-pay | Admitting: Cardiology

## 2023-05-05 DIAGNOSIS — E119 Type 2 diabetes mellitus without complications: Secondary | ICD-10-CM | POA: Diagnosis not present

## 2023-05-05 DIAGNOSIS — H33301 Unspecified retinal break, right eye: Secondary | ICD-10-CM | POA: Diagnosis not present

## 2023-05-05 LAB — HM DIABETES EYE EXAM

## 2023-05-06 ENCOUNTER — Encounter: Payer: Self-pay | Admitting: Family Medicine

## 2023-05-06 ENCOUNTER — Other Ambulatory Visit: Payer: Self-pay

## 2023-05-07 ENCOUNTER — Other Ambulatory Visit (HOSPITAL_COMMUNITY): Payer: Self-pay

## 2023-05-07 MED ORDER — LOSARTAN POTASSIUM 25 MG PO TABS
25.0000 mg | ORAL_TABLET | Freq: Every day | ORAL | 3 refills | Status: DC
  Filled 2023-05-07: qty 90, 90d supply, fill #0
  Filled 2023-06-09 – 2023-08-04 (×2): qty 90, 90d supply, fill #1
  Filled 2023-11-03: qty 90, 90d supply, fill #2
  Filled 2024-02-16: qty 90, 90d supply, fill #3

## 2023-05-24 DIAGNOSIS — G4733 Obstructive sleep apnea (adult) (pediatric): Secondary | ICD-10-CM | POA: Diagnosis not present

## 2023-06-02 ENCOUNTER — Other Ambulatory Visit (HOSPITAL_COMMUNITY): Payer: Self-pay

## 2023-06-02 ENCOUNTER — Other Ambulatory Visit: Payer: Self-pay

## 2023-06-02 MED ORDER — INFLUENZA VAC A&B SURF ANT ADJ 0.5 ML IM SUSY
0.5000 mL | PREFILLED_SYRINGE | Freq: Once | INTRAMUSCULAR | 0 refills | Status: AC
  Filled 2023-06-02 (×2): qty 0.5, 1d supply, fill #0

## 2023-06-09 ENCOUNTER — Other Ambulatory Visit (HOSPITAL_COMMUNITY): Payer: Self-pay

## 2023-06-09 ENCOUNTER — Other Ambulatory Visit (HOSPITAL_BASED_OUTPATIENT_CLINIC_OR_DEPARTMENT_OTHER): Payer: Self-pay | Admitting: Cardiology

## 2023-06-09 MED ORDER — DILTIAZEM HCL ER COATED BEADS 180 MG PO CP24
180.0000 mg | ORAL_CAPSULE | Freq: Every day | ORAL | 2 refills | Status: DC
  Filled 2023-06-09: qty 90, 90d supply, fill #0
  Filled 2023-09-10 – 2023-10-08 (×2): qty 90, 90d supply, fill #1
  Filled 2023-12-30: qty 90, 90d supply, fill #2

## 2023-06-10 ENCOUNTER — Ambulatory Visit: Payer: Medicare HMO | Admitting: Gastroenterology

## 2023-06-20 DIAGNOSIS — J029 Acute pharyngitis, unspecified: Secondary | ICD-10-CM | POA: Diagnosis not present

## 2023-06-20 DIAGNOSIS — R0981 Nasal congestion: Secondary | ICD-10-CM | POA: Diagnosis not present

## 2023-06-23 DIAGNOSIS — G4733 Obstructive sleep apnea (adult) (pediatric): Secondary | ICD-10-CM | POA: Diagnosis not present

## 2023-06-27 ENCOUNTER — Other Ambulatory Visit (HOSPITAL_COMMUNITY): Payer: Self-pay

## 2023-07-03 ENCOUNTER — Other Ambulatory Visit (HOSPITAL_COMMUNITY): Payer: Self-pay

## 2023-07-04 ENCOUNTER — Ambulatory Visit: Payer: Medicare HMO | Admitting: Family Medicine

## 2023-07-11 ENCOUNTER — Ambulatory Visit: Payer: HMO | Admitting: Gastroenterology

## 2023-07-11 ENCOUNTER — Encounter: Payer: Self-pay | Admitting: Gastroenterology

## 2023-07-11 VITALS — BP 118/70 | HR 59 | Ht 66.0 in | Wt 199.0 lb

## 2023-07-11 DIAGNOSIS — E119 Type 2 diabetes mellitus without complications: Secondary | ICD-10-CM | POA: Diagnosis not present

## 2023-07-11 DIAGNOSIS — Z7984 Long term (current) use of oral hypoglycemic drugs: Secondary | ICD-10-CM

## 2023-07-11 DIAGNOSIS — Z1211 Encounter for screening for malignant neoplasm of colon: Secondary | ICD-10-CM

## 2023-07-11 DIAGNOSIS — Z853 Personal history of malignant neoplasm of breast: Secondary | ICD-10-CM

## 2023-07-11 DIAGNOSIS — G4730 Sleep apnea, unspecified: Secondary | ICD-10-CM

## 2023-07-11 NOTE — Progress Notes (Signed)
HPI :  68 year old female with a history of breast cancer, sleep apnea, diabetes, here to establish care status screening colonoscopy.  She currently has no issues with her bowels.  As long as she drinks plenty of water she moves her bowels regularly.  She can get constipated if she does not drink enough water.  She denies any abdominal pains.  No blood in her stools.  She thinks her aunt and grandfather had colon cancer but no first-degree relatives with colon cancer.  She has otherwise been doing well with her bowel function.  She is not having any cardiopulmonary symptoms.  She does have a history of palpitations and has had a workup with cardiology.  Has sleep apnea CPAP and that works really well for her.  She did have breast cancer back in 2013, is on hormonal therapy for that, in remission and doing well in that regard.  She is concerned about her colon cancer risks given her history of breast cancer.  I reviewed her records to date.  She had her last 2 colonoscopies in [place].  She had a colonoscopy in May 2018 for rectal bleeding.  She had hemorrhoids noted and diverticulosis but no polyps.  Reportedly had a good prep and they recommended a repeat exam in 10 years.  Prior to that she had an exam in 2013 that was normal and the physician had recommended a 10-year follow-up at that point as well.  She had thought she was due for a colonoscopy, we discussed her exams and colon cancer screening in general.     Colonoscopy 11/11/2016: "good prep" Diverticulosis and hemorrhoids, no polyps.  Biopsies taken to rule out microscopic colitis.  Colonoscopy 02/07/2012: Normal exam   Echocardiogram 12/06/20: EF 55-60%, grade I DD  Past Medical History:  Diagnosis Date   Agatston coronary artery calcium score less than 100    44.7 on scan 03/2022   Benign essential HTN 04/14/2023   Breast cancer ([name]) 2013   Coronary artery calcification seen on CT scan 04/07/2022   44.7 on scan 03/2022  -ETT  12/14/20: no ischemia  -CAC score 04/05/22: 44.7 (70th percentile); aortic atherosclerosis     Depression    Family history of malignant neoplasm of digestive organ    Family history of malignant neoplasm of ovary    Family history of melanoma    Family history of uterine cancer    Fatty liver    Hyperlipidemia    Hypertension    Hypothyroidism 05/01/2016   Infertility, female    Lactose intolerance    Obese    OSA (obstructive sleep apnea) 02/15/2021   Paroxysmal atrial tachycardia ([name]) 04/14/2023   -TTE 12/06/20: EF 55-60, no RWMA, Gr 1 DD, NL RVSF, trivial MR, RAP 3  -Monitor 11/2020: nonsustained ATach, PVCs -Diltiazem Rx     Personal history of malignant neoplasm of breast    Pre-diabetes    Shortness of breath on exertion    Sleep apnea    Thyroid disease    Type 2 diabetes mellitus with hyperglycemia, without long-term current use of insulin ([name]) 02/17/2021     Past Surgical History:  Procedure Laterality Date   BREAST SURGERY     double mastectomy and chemotherapy   BUNIONECTOMY Right    HERNIA REPAIR     REFRACTIVE SURGERY     Family History  Problem Relation Age of Onset   Uterine cancer Mother    Hyperlipidemia Mother    Heart disease Mother  Lung cancer Father    Stroke Maternal Grandmother    Colon cancer Maternal Grandmother    Cancer Maternal Grandfather    Liver disease Maternal Grandfather    Heart disease Paternal Grandmother    Stroke Paternal Grandfather    Colon cancer Maternal Aunt    Social History   Tobacco Use   Smoking status: Never   Smokeless tobacco: Never  Vaping Use   Vaping status: Never Used  Substance Use Topics   Alcohol use: No   Drug use: No   Current Outpatient Medications  Medication Sig Dispense Refill   blood glucose meter kit and supplies Dispense based on patient and insurance preference. Use up to four times daily as directed. (FOR ICD-10 E10.9, E11.9). 1 each 0   diltiazem (CARTIA XT) 180 MG 24 hr capsule  Take 1 capsule (180 mg total) by mouth daily. 90 capsule 2   doxylamine, Sleep, (UNISOM) 25 MG tablet Take 25 mg by mouth at bedtime as needed for sleep. 1 nightly     escitalopram (LEXAPRO) 20 MG tablet Take 1 tablet (20 mg total) by mouth daily. 90 tablet 1   ezetimibe (ZETIA) 10 MG tablet Take 1 tablet (10 mg total) by mouth daily. 90 tablet 3   fenofibrate (TRICOR) 48 MG tablet Take 1 tablet (48 mg total) by mouth daily. 90 tablet 0   letrozole (FEMARA) 2.5 MG tablet Take 1 tablet (2.5 mg total) by mouth daily. 90 tablet 3   levothyroxine (SYNTHROID) 137 MCG tablet Take 1 tablet (137 mcg total) by mouth daily before breakfast. 90 tablet 1   losartan (COZAAR) 25 MG tablet Take 1 tablet (25 mg total) by mouth daily. 90 tablet 3   metFORMIN (GLUCOPHAGE) 500 MG tablet Take 1 tablet (500 mg total) by mouth 2 (two) times daily. 180 tablet 1   rosuvastatin (CRESTOR) 40 MG tablet Take 1 tablet (40 mg total) by mouth daily. 90 tablet 1   No current facility-administered medications for this visit.   Allergies  Allergen Reactions   No Known Allergies      Review of Systems: All systems reviewed and negative except where noted in HPI.   Lab Results  Component Value Date   WBC 7.4 10/24/2020   HGB 15.0 10/24/2020   HCT 45.6 10/24/2020   MCV 99.8 10/24/2020   PLT 289 10/24/2020    Lab Results  Component Value Date   NA 139 01/22/2023   CL 103 01/22/2023   K 4.2 01/22/2023   CO2 30 01/22/2023   BUN 15 01/22/2023   CREATININE 0.72 01/22/2023   GFR 86.88 01/22/2023   CALCIUM 10.4 01/22/2023   ALBUMIN 4.7 01/22/2023   GLUCOSE 73 01/22/2023    Lab Results  Component Value Date   ALT 20 01/22/2023   AST 26 01/22/2023   ALKPHOS 52 01/22/2023   BILITOT 0.4 01/22/2023     Physical Exam: BP 118/70 (BP Location: Left Arm, Patient Position: Sitting, Cuff Size: Normal)   Pulse (!) 59   Ht 5\' 6"  (1.676 m)   Wt 199 lb (90.3 kg)   SpO2 96%   BMI 32.12 kg/m  Constitutional:  Pleasant,well-developed, female in no acute distress. HEENT: Normocephalic and atraumatic. Conjunctivae are normal. No scleral icterus. Neck supple.  Cardiovascular: Normal rate, regular rhythm.  Pulmonary/chest: Effort normal and breath sounds normal.  Abdominal: Soft, nondistended, nontender. There are no masses palpable. No hepatomegaly. Extremities: no edema Lymphadenopathy: No cervical adenopathy noted. Neurological: Alert and oriented to person  place and time. Skin: Skin is warm and dry. No rashes noted. Psychiatric: Normal mood and affect. Behavior is normal.   ASSESSMENT: 68 y.o. female here for assessment of the following  1. Colon cancer screening    History of breast cancer, she has had 2 previous colonoscopies in 2013 and 2018 that were both normal without polyps.  Her family history of second-degree relatives with colon cancer does not warrant increased frequency of screening exams.  She has no symptoms that bother her in regards to her bowel function.  Based on national screening guidelines for colon cancer and results of her last exams, I do not think she needs another colonoscopy for screening purposes for 10 years after her last exam which would be in May 2028. She has been anxious about this in light of her history of breast cancer and concern for getting another cancer in her body.  I relayed that there is typically not a link between breast cancer and colon cancer and that history should not increase her risk for colon cancer.  That being said, if she is anxious about this and really wants to have the exam done, I offered her a colonoscopy following discussion of risks and benefits of it, as long as she understands the risks and is willing to accept that.  Additionally, it is unclear if her insurance would cover a colonoscopy at this time given she is not due for it.  I reassured her and think she is okay to wait until 2028 for her next exam.  However, if again she is anxious  about this and wants to do it sooner, I offered this to her, but unclear if insurance will cover.  She will think about this and discuss with her family and get back to me to determine how she wants to proceed.  In the interim place recall for May 2028 for her next exam if we do not hear from her in the interim  Harlin Rain, MD [facility]

## 2023-07-13 ENCOUNTER — Other Ambulatory Visit: Payer: Self-pay | Admitting: Cardiology

## 2023-07-15 ENCOUNTER — Other Ambulatory Visit (HOSPITAL_COMMUNITY): Payer: Self-pay

## 2023-07-15 MED ORDER — FENOFIBRATE 48 MG PO TABS
48.0000 mg | ORAL_TABLET | Freq: Every day | ORAL | 2 refills | Status: DC
  Filled 2023-07-15: qty 90, 90d supply, fill #0
  Filled 2023-10-16: qty 90, 90d supply, fill #1
  Filled 2024-01-31: qty 90, 90d supply, fill #2

## 2023-07-21 ENCOUNTER — Ambulatory Visit: Payer: Medicare HMO | Admitting: Family Medicine

## 2023-07-24 ENCOUNTER — Telehealth: Payer: Self-pay | Admitting: Family Medicine

## 2023-07-24 NOTE — Telephone Encounter (Signed)
Type of form received: Chronic Condition Verification Form  Additional comments: Patient has enrolled in the Chronic Special needs plan for 2025  Received by: Cassie  Form should be Faxed/mailed to: (address/ fax #) [phone]  Is patient requesting call for pickup: N/A  Form placed:  Labeled & placed in provider bin  Attach charge sheet.  Provider will determine charge.  Individual made aware of 3-5 business day turn around? N/A

## 2023-07-24 NOTE — Telephone Encounter (Signed)
Paperwork has been collected and placed in PCPs sign folder

## 2023-07-24 NOTE — Telephone Encounter (Signed)
Paperwork completed and placed in fax bin at back nurse station

## 2023-08-04 ENCOUNTER — Other Ambulatory Visit: Payer: Self-pay | Admitting: Family Medicine

## 2023-08-04 DIAGNOSIS — E119 Type 2 diabetes mellitus without complications: Secondary | ICD-10-CM

## 2023-08-04 DIAGNOSIS — F418 Other specified anxiety disorders: Secondary | ICD-10-CM

## 2023-08-05 ENCOUNTER — Other Ambulatory Visit (HOSPITAL_COMMUNITY): Payer: Self-pay

## 2023-08-05 ENCOUNTER — Other Ambulatory Visit: Payer: Self-pay

## 2023-08-05 MED ORDER — ESCITALOPRAM OXALATE 20 MG PO TABS
20.0000 mg | ORAL_TABLET | Freq: Every day | ORAL | 1 refills | Status: DC
Start: 2023-08-05 — End: 2024-01-31
  Filled 2023-08-05: qty 90, 90d supply, fill #0
  Filled 2023-11-03: qty 90, 90d supply, fill #1

## 2023-08-05 MED ORDER — METFORMIN HCL 500 MG PO TABS
500.0000 mg | ORAL_TABLET | Freq: Two times a day (BID) | ORAL | 1 refills | Status: DC
Start: 2023-08-05 — End: 2023-09-24
  Filled 2023-08-05: qty 180, 90d supply, fill #0

## 2023-08-06 ENCOUNTER — Ambulatory Visit: Payer: Self-pay | Admitting: Family Medicine

## 2023-08-12 ENCOUNTER — Other Ambulatory Visit: Payer: Self-pay | Admitting: Family Medicine

## 2023-08-12 DIAGNOSIS — E039 Hypothyroidism, unspecified: Secondary | ICD-10-CM

## 2023-08-12 DIAGNOSIS — E785 Hyperlipidemia, unspecified: Secondary | ICD-10-CM

## 2023-08-12 DIAGNOSIS — E119 Type 2 diabetes mellitus without complications: Secondary | ICD-10-CM

## 2023-08-12 NOTE — Progress Notes (Signed)
Labs for virtual visit d/t inclement weather.

## 2023-08-13 ENCOUNTER — Ambulatory Visit: Payer: Self-pay | Admitting: Family Medicine

## 2023-09-22 ENCOUNTER — Other Ambulatory Visit (HOSPITAL_COMMUNITY): Payer: Self-pay

## 2023-09-23 ENCOUNTER — Inpatient Hospital Stay: Payer: Self-pay | Admitting: Hematology and Oncology

## 2023-09-23 NOTE — Assessment & Plan Note (Deleted)
 04/24/2022: Right breast suspicious abnormality 1.2 cm biopsy: IDC with DCIS, subareolar lesion: DCIS ER 100%, PR 90%, HER2 negative, poorly differentiated 06/23/2022: Bilateral mastectomies: Right breast: Poorly differentiated IDC 3.8 cm ER 100%, PR 90%, HER2 negative, margins negative, T2 N0 M0 stage IIa 0/3 lymph nodes, left breast: Benign February 2014-July 2014: [facility] Taxol July 2014: Started antiestrogen therapy (The treatment was given at [facility] hematology oncology) Dr. Peggyann Shoals   Letrozole toxicities: None Patient has been on oral antiestrogen therapy for 10 years.  I discussed with her about the pros and cons of extended endocrine therapy.  There is no clinical data to support continuation of antiestrogen therapy but patient wishes to stay on it and she understands the risks and benefits and we decided to continue on with the letrozole indefinitely.  We discussed the risk of coronary artery disease as well as osteoporosis.   Breast cancer surveillance: Breast exam 09/23/2023: Benign Mammogram: Patient was getting annual mammograms and ultrasounds at [facility].  I discussed with her that since she had bilateral mastectomies there is no role for mammograms or ultrasounds.  Ultrasounds can be done if there is any palpable abnormality. Recommended guardant reveal   Return to clinic in 1 year for follow-up

## 2023-09-24 ENCOUNTER — Encounter: Payer: Self-pay | Admitting: Family Medicine

## 2023-09-24 ENCOUNTER — Other Ambulatory Visit (HOSPITAL_COMMUNITY): Payer: Self-pay

## 2023-09-24 ENCOUNTER — Ambulatory Visit (INDEPENDENT_AMBULATORY_CARE_PROVIDER_SITE_OTHER): Payer: Self-pay | Admitting: Family Medicine

## 2023-09-24 VITALS — BP 118/60 | HR 62 | Temp 97.8°F | Ht 66.0 in | Wt 200.6 lb

## 2023-09-24 DIAGNOSIS — E039 Hypothyroidism, unspecified: Secondary | ICD-10-CM | POA: Diagnosis not present

## 2023-09-24 DIAGNOSIS — Z7984 Long term (current) use of oral hypoglycemic drugs: Secondary | ICD-10-CM

## 2023-09-24 DIAGNOSIS — E119 Type 2 diabetes mellitus without complications: Secondary | ICD-10-CM | POA: Diagnosis not present

## 2023-09-24 DIAGNOSIS — E785 Hyperlipidemia, unspecified: Secondary | ICD-10-CM

## 2023-09-24 DIAGNOSIS — F418 Other specified anxiety disorders: Secondary | ICD-10-CM | POA: Diagnosis not present

## 2023-09-24 MED ORDER — LEVOTHYROXINE SODIUM 137 MCG PO TABS
137.0000 ug | ORAL_TABLET | Freq: Every day | ORAL | 1 refills | Status: DC
  Filled 2023-09-24: qty 90, 90d supply, fill #0
  Filled 2023-12-30: qty 90, 90d supply, fill #1

## 2023-09-24 MED ORDER — METFORMIN HCL 500 MG PO TABS
500.0000 mg | ORAL_TABLET | Freq: Two times a day (BID) | ORAL | 1 refills | Status: DC
  Filled 2023-09-24 – 2023-11-03 (×2): qty 180, 90d supply, fill #0
  Filled 2024-02-16: qty 180, 90d supply, fill #1

## 2023-09-24 MED ORDER — ROSUVASTATIN CALCIUM 40 MG PO TABS
40.0000 mg | ORAL_TABLET | Freq: Every day | ORAL | 1 refills | Status: DC
Start: 2023-09-24 — End: 2024-03-25
  Filled 2023-09-24 – 2023-11-03 (×2): qty 90, 90d supply, fill #0
  Filled 2024-01-31: qty 90, 90d supply, fill #1

## 2023-09-24 NOTE — Patient Instructions (Addendum)
 Thanks for coming today.  No med changes at this time.  I will let you know if there are any concerns on labs.  Please let me know if there are any questions, otherwise I will follow-up with you for a physical in 6 months.  Take care!

## 2023-09-24 NOTE — Progress Notes (Signed)
 Subjective:  Patient ID: Brittany Vincent, female    DOB: 08-17-1955  Age: 68 y.o. MRN: [id number]  CC:  Chief Complaint  Patient presents with   Medical Management of Chronic Issues    Pt is doing well reports no questions at this time     HPI [name] presents for follow up.   Situational anxiety Treated with Lexapro 20 mg daily. Still managing well.  Still working at [employer]. 2 shifts per week. Going well.      09/24/2023    4:02 PM 01/22/2023    3:59 PM 11/15/2020    2:58 PM  GAD 7 : Generalized Anxiety Score  Nervous, Anxious, on Edge 0 0 0  Control/stop worrying 0 0 0  Worry too much - different things 0 0 0  Trouble relaxing 0 0 0  Restless 0 0 0  Easily annoyed or irritable 0 0 1  Afraid - awful might happen 0 0 0  Total GAD 7 Score 0 0 1  Anxiety Difficulty  Not difficult at all     Hypothyroidism: Lab Results  Component Value Date   TSH 0.72 01/22/2023  Taking medication daily.  Synthroid 137 mcg daily No new hot or cold intolerance. No new hair or skin changes, heart palpitations or new fatigue. No new weight changes.    Hypertension: Diltiazem 180 mg daily, losartan 25 mg daily.  Home readings: none.  BP Readings from Last 3 Encounters:  09/24/23 118/60  07/11/23 118/70  04/14/23 123/60   Lab Results  Component Value Date   CREATININE 0.72 01/22/2023   Hyperlipidemia: With prior elevated coronary calcium scoring, followed by cardiology previously treated with Crestor 40 mg daily, fenofibrate 48 mg and Zetia 10 mg daily.  Dr. Mayford Knife.  Denies any myalgias with this combination. Lab Results  Component Value Date   CHOL 111 12/18/2022   HDL 46 12/18/2022   LDLCALC 45 12/18/2022   LDLDIRECT 100.0 11/15/2020   TRIG 111 12/18/2022   CHOLHDL 2.4 12/18/2022   Lab Results  Component Value Date   ALT 20 01/22/2023   AST 26 01/22/2023   ALKPHOS 52 01/22/2023   BILITOT 0.4 01/22/2023    Diabetes: With HLD.  Metformin 500 mg twice daily, she is on ARB  and statin.  Microalbumin: due.  Optho, foot exam, pneumovax: UTD.   Lab Results  Component Value Date   HGBA1C 6.6 (H) 01/22/2023   HGBA1C 6.7 (H) 07/15/2022   HGBA1C 6.5 01/16/2022   Lab Results  Component Value Date   MICROALBUR 1.0 01/09/2022   LDLCALC 45 12/18/2022   CREATININE 0.72 01/22/2023     History Patient Active Problem List   Diagnosis Date Noted   Paroxysmal atrial tachycardia ([name]) 04/14/2023   Benign essential HTN 04/14/2023   Coronary artery calcification seen on CT scan 04/07/2022   Type 2 diabetes mellitus with hyperglycemia, without long-term current use of insulin ([name]) 02/17/2021   OSA (obstructive sleep apnea) 02/15/2021   Class 1 obesity with serious comorbidity and body mass index (BMI) of 31.0 to 31.9 in adult 02/15/2021   Genetic testing 12/22/2020   Family history of malignant neoplasm of ovary 12/13/2020   Family history of malignant neoplasm of digestive organ 12/13/2020   Family history of melanoma 12/13/2020   Family history of uterine cancer 12/13/2020   Personal history of malignant neoplasm of breast 05/01/2016   Hypothyroidism 05/01/2016   Hyperlipidemia 05/01/2016   Malignant neoplasm of central portion of right breast in female,  estrogen receptor positive ([name]) 2013   Past Medical History:  Diagnosis Date   Agatston coronary artery calcium score less than 100    44.7 on scan 03/2022   Benign essential HTN 04/14/2023   Breast cancer ([name]) 2013   Coronary artery calcification seen on CT scan 04/07/2022   44.7 on scan 03/2022  -ETT 12/14/20: no ischemia  -CAC score 04/05/22: 44.7 (70th percentile); aortic atherosclerosis     Depression    Family history of malignant neoplasm of digestive organ    Family history of malignant neoplasm of ovary    Family history of melanoma    Family history of uterine cancer    Fatty liver    Hyperlipidemia    Hypertension    Hypothyroidism 05/01/2016   Infertility, female    Lactose intolerance     Obese    OSA (obstructive sleep apnea) 02/15/2021   Paroxysmal atrial tachycardia ([name]) 04/14/2023   -TTE 12/06/20: EF 55-60, no RWMA, Gr 1 DD, NL RVSF, trivial MR, RAP 3  -Monitor 11/2020: nonsustained ATach, PVCs -Diltiazem Rx     Personal history of malignant neoplasm of breast    Pre-diabetes    Shortness of breath on exertion    Sleep apnea    Thyroid disease    Type 2 diabetes mellitus with hyperglycemia, without long-term current use of insulin ([name]) 02/17/2021   Past Surgical History:  Procedure Laterality Date   BREAST SURGERY     double mastectomy and chemotherapy   BUNIONECTOMY Right    HERNIA REPAIR     REFRACTIVE SURGERY     Allergies  Allergen Reactions   No Known Allergies    Prior to Admission medications   Medication Sig Start Date End Date Taking? Authorizing Provider  blood glucose meter kit and supplies Dispense based on patient and insurance preference. Use up to four times daily as directed. (FOR ICD-10 E10.9, E11.9). 11/15/19  Yes Shade Flood, MD  diltiazem (CARTIA XT) 180 MG 24 hr capsule Take 1 capsule (180 mg total) by mouth daily. 06/09/23  Yes Turner, Cornelious Bryant, MD  doxylamine, Sleep, (UNISOM) 25 MG tablet Take 25 mg by mouth at bedtime as needed for sleep. 1 nightly   Yes [provider]  escitalopram (LEXAPRO) 20 MG tablet Take 1 tablet (20 mg total) by mouth daily. 08/05/23  Yes Shade Flood, MD  ezetimibe (ZETIA) 10 MG tablet Take 1 tablet (10 mg total) by mouth daily. 10/21/22 11/04/23 Yes Turner, Cornelious Bryant, MD  fenofibrate (TRICOR) 48 MG tablet Take 1 tablet (48 mg total) by mouth daily. 07/15/23  Yes Turner, Cornelious Bryant, MD  letrozole (FEMARA) 2.5 MG tablet Take 1 tablet (2.5 mg total) by mouth daily. 09/23/22  Yes Serena Croissant, MD  levothyroxine (SYNTHROID) 137 MCG tablet Take 1 tablet (137 mcg total) by mouth daily before breakfast. 01/22/23  Yes Shade Flood, MD  losartan (COZAAR) 25 MG tablet Take 1 tablet (25 mg total) by mouth  daily. 05/07/23  Yes Turner, Cornelious Bryant, MD  metFORMIN (GLUCOPHAGE) 500 MG tablet Take 1 tablet (500 mg total) by mouth 2 (two) times daily. 08/05/23  Yes Shade Flood, MD  rosuvastatin (CRESTOR) 40 MG tablet Take 1 tablet (40 mg total) by mouth daily. 01/22/23  Yes Shade Flood, MD   Social History   Socioeconomic History   Marital status: Married    Spouse name: Not on file   Number of children: 3   Years of education: Not  on file   Highest education level: Not on file  Occupational History   Occupation: Charity fundraiser   Occupation: Telephonic nursing  Tobacco Use   Smoking status: Never   Smokeless tobacco: Never  Vaping Use   Vaping status: Never Used  Substance and Sexual Activity   Alcohol use: No   Drug use: No   Sexual activity: Not on file  Other Topics Concern   Not on file  Social History Narrative   Lives with her husband.   Older son lives in [place], [place] with his wife and their 2 children.   Younger Theatre stage manager) son lives in [place], with his wife Kriste Basque) and their daughter, Ava.   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   Daughter recently moved to [place].   Moved to [place] from [place], [place] to be nearer to [place], [place] and Ava.   Social Drivers of Corporate investment banker Strain: Low Risk  (05/09/2022)   Overall Financial Resource Strain (CARDIA)    Difficulty of Paying Living Expenses: Not hard at all  Food Insecurity: No Food Insecurity (05/09/2022)   Hunger Vital Sign    Worried About Running Out of Food in the Last Year: Never true    Ran Out of Food in the Last Year: Never true  Transportation Needs: No Transportation Needs (05/09/2022)   PRAPARE - Administrator, Civil Service (Medical): No    Lack of Transportation (Non-Medical): No  Physical Activity: Inactive (05/09/2022)   Exercise Vital Sign    Days of Exercise per Week: 0 days    Minutes of Exercise per Session: 0 min  Stress: No Stress Concern Present  (05/09/2022)   [employer] of Occupational Health - Occupational Stress Questionnaire    Feeling of Stress : Not at all  Social Connections: Moderately Isolated (05/09/2022)   Social Connection and Isolation Panel [NHANES]    Frequency of Communication with Friends and Family: More than three times a week    Frequency of Social Gatherings with Friends and Family: More than three times a week    Attends Religious Services: Never    Database administrator or Organizations: No    Attends Banker Meetings: Never    Marital Status: Married  Catering manager Violence: Not At Risk (05/09/2022)   Humiliation, Afraid, Rape, and Kick questionnaire    Fear of Current or Ex-Partner: No    Emotionally Abused: No    Physically Abused: No    Sexually Abused: No    Review of Systems  Constitutional:  Negative for fatigue and unexpected weight change.  Respiratory:  Negative for chest tightness and shortness of breath.   Cardiovascular:  Negative for chest pain, palpitations and leg swelling.  Gastrointestinal:  Negative for abdominal pain and blood in stool.  Neurological:  Negative for dizziness, syncope, light-headedness and headaches.     Objective:   Vitals:   09/24/23 1607  BP: 118/60  Pulse: 62  Temp: 97.8 F (36.6 C)  TempSrc: Temporal  SpO2: 96%  Weight: 200 lb 9.6 oz (91 kg)  Height: 5\' 6"  (1.676 m)     Physical Exam Vitals reviewed.  Constitutional:      Appearance: Normal appearance. She is well-developed.  HENT:     Head: Normocephalic and atraumatic.  Eyes:     Conjunctiva/sclera: Conjunctivae normal.     Pupils: Pupils are equal, round, and reactive to light.  Neck:     Vascular: No  carotid bruit.  Cardiovascular:     Rate and Rhythm: Normal rate and regular rhythm.     Heart sounds: Normal heart sounds.  Pulmonary:     Effort: Pulmonary effort is normal.     Breath sounds: Normal breath sounds.  Abdominal:     Palpations: Abdomen is soft.  There is no pulsatile mass.     Tenderness: There is no abdominal tenderness.  Musculoskeletal:     Right lower leg: No edema.     Left lower leg: No edema.  Skin:    General: Skin is warm and dry.  Neurological:     Mental Status: She is alert and oriented to person, place, and time.  Psychiatric:        Mood and Affect: Mood normal.        Behavior: Behavior normal.     Assessment & Plan:  Judithann Villamar is a 68 y.o. female . Type 2 diabetes mellitus without complication, without long-term current use of insulin ([name]) - Plan: Comprehensive metabolic panel with GFR, Hemoglobin A1c, Microalbumin / creatinine urine ratio, Lipid panel  -Tolerating current regimen of metformin, check A1c and adjust plan accordingly.  Continues to be mindful of diet and effect on blood sugar.  No new meds for now.  Hyperlipidemia, unspecified hyperlipidemia type  -Tolerating current regimen, continue same, follow-up with cardiology as planned.  Hypothyroidism, unspecified type  -Clinically stable, denies any new symptoms or side effects with meds.  TSH has been stable for years.  Can recheck at physical in 6 months.  Situational anxiety  -Stable with Lexapro current dose, continue same.  No orders of the defined types were placed in this encounter.  Patient Instructions  Thanks for coming today.  No med changes at this time.  I will let you know if there are any concerns on labs.    Signed,   Meredith Staggers, MD [facility], [facility] 09/24/23 4:42 PM

## 2023-09-25 ENCOUNTER — Other Ambulatory Visit: Payer: Self-pay

## 2023-09-25 LAB — COMPREHENSIVE METABOLIC PANEL WITH GFR
ALT: 24 U/L (ref 0–35)
AST: 29 U/L (ref 0–37)
Albumin: 4.4 g/dL (ref 3.5–5.2)
Alkaline Phosphatase: 45 U/L (ref 39–117)
BUN: 15 mg/dL (ref 6–23)
CO2: 30 meq/L (ref 19–32)
Calcium: 9.6 mg/dL (ref 8.4–10.5)
Chloride: 103 meq/L (ref 96–112)
Creatinine, Ser: 0.82 mg/dL (ref 0.40–1.20)
GFR: 73.98 mL/min (ref >60.00)
Glucose, Bld: 96 mg/dL (ref 70–99)
Potassium: 4.4 meq/L (ref 3.5–5.1)
Sodium: 138 meq/L (ref 135–145)
Total Bilirubin: 0.4 mg/dL (ref 0.2–1.2)
Total Protein: 7.3 g/dL (ref 6.0–8.3)

## 2023-09-25 LAB — LIPID PANEL
Cholesterol: 138 mg/dL (ref 0–200)
HDL: 43.1 mg/dL (ref >39.00)
LDL Cholesterol: 43 mg/dL (ref 0–99)
NonHDL: 94.83
Total CHOL/HDL Ratio: 3
Triglycerides: 260 mg/dL — ABNORMAL HIGH (ref 0.0–149.0)
VLDL: 52 mg/dL — ABNORMAL HIGH (ref 0.0–40.0)

## 2023-09-25 LAB — MICROALBUMIN / CREATININE URINE RATIO
Creatinine,U: 78.8 mg/dL
Microalb Creat Ratio: 10.3 mg/g (ref 0.0–30.0)
Microalb, Ur: 0.8 mg/dL (ref 0.0–1.9)

## 2023-09-25 LAB — HEMOGLOBIN A1C: Hgb A1c MFr Bld: 6.9 % — ABNORMAL HIGH (ref 4.6–6.5)

## 2023-09-29 ENCOUNTER — Encounter: Payer: Self-pay | Admitting: Family Medicine

## 2023-10-02 DIAGNOSIS — Z853 Personal history of malignant neoplasm of breast: Secondary | ICD-10-CM | POA: Diagnosis not present

## 2023-10-15 ENCOUNTER — Other Ambulatory Visit (HOSPITAL_COMMUNITY): Payer: Self-pay

## 2023-10-16 ENCOUNTER — Other Ambulatory Visit: Payer: Self-pay

## 2023-10-23 DIAGNOSIS — C50911 Malignant neoplasm of unspecified site of right female breast: Secondary | ICD-10-CM | POA: Diagnosis not present

## 2023-10-27 ENCOUNTER — Encounter: Payer: Self-pay | Admitting: Family Medicine

## 2023-10-27 DIAGNOSIS — Z111 Encounter for screening for respiratory tuberculosis: Secondary | ICD-10-CM

## 2023-10-28 ENCOUNTER — Other Ambulatory Visit (HOSPITAL_COMMUNITY): Payer: Self-pay

## 2023-10-28 ENCOUNTER — Other Ambulatory Visit: Payer: Self-pay | Admitting: Cardiology

## 2023-10-28 DIAGNOSIS — E782 Mixed hyperlipidemia: Secondary | ICD-10-CM

## 2023-10-28 MED ORDER — EZETIMIBE 10 MG PO TABS
10.0000 mg | ORAL_TABLET | Freq: Every day | ORAL | 1 refills | Status: DC
  Filled 2023-10-28: qty 90, 90d supply, fill #0
  Filled 2024-01-31: qty 90, 90d supply, fill #1

## 2023-10-28 NOTE — Addendum Note (Signed)
 Addended by: Stevee Valenta R on: 10/28/2023 11:15 AM   Modules accepted: Orders

## 2023-10-31 ENCOUNTER — Other Ambulatory Visit (INDEPENDENT_AMBULATORY_CARE_PROVIDER_SITE_OTHER)

## 2023-10-31 DIAGNOSIS — E785 Hyperlipidemia, unspecified: Secondary | ICD-10-CM

## 2023-10-31 DIAGNOSIS — Z111 Encounter for screening for respiratory tuberculosis: Secondary | ICD-10-CM

## 2023-10-31 DIAGNOSIS — E119 Type 2 diabetes mellitus without complications: Secondary | ICD-10-CM

## 2023-10-31 DIAGNOSIS — E039 Hypothyroidism, unspecified: Secondary | ICD-10-CM | POA: Diagnosis not present

## 2023-10-31 LAB — COMPREHENSIVE METABOLIC PANEL WITH GFR
ALT: 28 U/L (ref 0–35)
AST: 29 U/L (ref 0–37)
Albumin: 4.6 g/dL (ref 3.5–5.2)
Alkaline Phosphatase: 40 U/L (ref 39–117)
BUN: 16 mg/dL (ref 6–23)
CO2: 27 meq/L (ref 19–32)
Calcium: 9.7 mg/dL (ref 8.4–10.5)
Chloride: 103 meq/L (ref 96–112)
Creatinine, Ser: 0.8 mg/dL (ref 0.40–1.20)
GFR: 76.15 mL/min (ref >60.00)
Glucose, Bld: 105 mg/dL — ABNORMAL HIGH (ref 70–99)
Potassium: 4 meq/L (ref 3.5–5.1)
Sodium: 139 meq/L (ref 135–145)
Total Bilirubin: 0.4 mg/dL (ref 0.2–1.2)
Total Protein: 7.6 g/dL (ref 6.0–8.3)

## 2023-10-31 LAB — LIPID PANEL
Cholesterol: 115 mg/dL (ref 0–200)
HDL: 43.5 mg/dL (ref >39.00)
LDL Cholesterol: 26 mg/dL (ref 0–99)
NonHDL: 71.92
Total CHOL/HDL Ratio: 3
Triglycerides: 228 mg/dL — ABNORMAL HIGH (ref 0.0–149.0)
VLDL: 45.6 mg/dL — ABNORMAL HIGH (ref 0.0–40.0)

## 2023-10-31 LAB — MICROALBUMIN / CREATININE URINE RATIO
Creatinine,U: 38.4 mg/dL
Microalb Creat Ratio: UNDETERMINED mg/g (ref 0.0–30.0)
Microalb, Ur: <0.7 mg/dL

## 2023-10-31 LAB — HEMOGLOBIN A1C: Hgb A1c MFr Bld: 7 % — ABNORMAL HIGH (ref 4.6–6.5)

## 2023-10-31 LAB — TSH: TSH: 1.53 u[IU]/mL (ref 0.35–5.50)

## 2023-11-03 ENCOUNTER — Other Ambulatory Visit: Payer: Self-pay | Admitting: Hematology and Oncology

## 2023-11-03 DIAGNOSIS — Z853 Personal history of malignant neoplasm of breast: Secondary | ICD-10-CM

## 2023-11-04 ENCOUNTER — Ambulatory Visit: Payer: Self-pay | Admitting: Family Medicine

## 2023-11-04 ENCOUNTER — Other Ambulatory Visit: Payer: Self-pay | Admitting: *Deleted

## 2023-11-04 ENCOUNTER — Other Ambulatory Visit: Payer: Self-pay

## 2023-11-04 ENCOUNTER — Other Ambulatory Visit (HOSPITAL_COMMUNITY): Payer: Self-pay

## 2023-11-04 DIAGNOSIS — Z853 Personal history of malignant neoplasm of breast: Secondary | ICD-10-CM

## 2023-11-04 LAB — QUANTIFERON-TB GOLD PLUS
Mitogen-NIL: >10 [IU]/mL
NIL: 0.01 [IU]/mL
QuantiFERON-TB Gold Plus: NEGATIVE
TB1-NIL: 0.01 [IU]/mL
TB2-NIL: 0 [IU]/mL

## 2023-11-04 MED ORDER — LETROZOLE 2.5 MG PO TABS
2.5000 mg | ORAL_TABLET | Freq: Every day | ORAL | 0 refills | Status: DC
Start: 2023-11-04 — End: 2023-11-04
  Filled 2023-11-04: qty 90, 90d supply, fill #0

## 2023-11-04 MED ORDER — LETROZOLE 2.5 MG PO TABS
2.5000 mg | ORAL_TABLET | Freq: Every day | ORAL | 0 refills | Status: DC
Start: 2023-11-04 — End: 2023-11-25
  Filled 2023-11-04: qty 90, 90d supply, fill #0

## 2023-11-06 ENCOUNTER — Telehealth: Payer: Self-pay

## 2023-11-06 NOTE — Telephone Encounter (Signed)
 Placed in patient pick up file in front office

## 2023-11-06 NOTE — Telephone Encounter (Signed)
 Copied from CRM [id number]. Topic: Clinical - Lab/Test Results >> Nov 06, 2023  4:48 PM Brittany Vincent wrote: Reason for CRM: Patient would like documentation printed with her QuantiFERON-TB Gold Plus results with her name on it  as it is required for work. The lab result document available to her doesn't include her name so her employer has not proof that the result truly belong to her. Patient is planning to stop by the office on Monday afternoon around 3:00 pm to pick up document.

## 2023-11-25 ENCOUNTER — Inpatient Hospital Stay: Attending: Hematology and Oncology | Admitting: Hematology and Oncology

## 2023-11-25 ENCOUNTER — Other Ambulatory Visit (HOSPITAL_COMMUNITY): Payer: Self-pay

## 2023-11-25 VITALS — BP 140/70 | HR 64 | Temp 98.1°F | Resp 18 | Wt 202.3 lb

## 2023-11-25 DIAGNOSIS — Z9013 Acquired absence of bilateral breasts and nipples: Secondary | ICD-10-CM | POA: Diagnosis not present

## 2023-11-25 DIAGNOSIS — M8580 Other specified disorders of bone density and structure, unspecified site: Secondary | ICD-10-CM | POA: Diagnosis not present

## 2023-11-25 DIAGNOSIS — Z9221 Personal history of antineoplastic chemotherapy: Secondary | ICD-10-CM | POA: Diagnosis not present

## 2023-11-25 DIAGNOSIS — Z853 Personal history of malignant neoplasm of breast: Secondary | ICD-10-CM | POA: Diagnosis not present

## 2023-11-25 DIAGNOSIS — Z79811 Long term (current) use of aromatase inhibitors: Secondary | ICD-10-CM | POA: Diagnosis not present

## 2023-11-25 DIAGNOSIS — C50111 Malignant neoplasm of central portion of right female breast: Secondary | ICD-10-CM | POA: Diagnosis not present

## 2023-11-25 DIAGNOSIS — Z170 Estrogen receptor positive status [ER+]: Secondary | ICD-10-CM | POA: Insufficient documentation

## 2023-11-25 MED ORDER — LETROZOLE 2.5 MG PO TABS
2.5000 mg | ORAL_TABLET | Freq: Every day | ORAL | 3 refills | Status: AC
Start: 2023-11-25 — End: ?
  Filled 2023-11-25 – 2024-01-31 (×3): qty 90, 90d supply, fill #0
  Filled 2024-04-26: qty 90, 90d supply, fill #1

## 2023-11-25 NOTE — Progress Notes (Signed)
 Patient Care Team: Benjiman Bras, MD as PCP - General (Family Medicine) Jacqueline Matsu, MD as PCP - Cardiology (Cardiology)  DIAGNOSIS:  Encounter Diagnoses  Name Primary?   Malignant neoplasm of central portion of right breast in female, estrogen receptor positive ([name]) Yes   History of breast cancer     SUMMARY OF ONCOLOGIC HISTORY: Oncology History  Malignant neoplasm of central portion of right breast in female, estrogen receptor positive ([name])  04/2012 Initial Diagnosis   Screening mammogram showed suspicious abnormality right breast 1.2 cm biopsy IDC with DCIS ER 100%, PR 90%, HER2 negative, breast MRI revealed the breast cancer spread to the nipple 3 cm with non-mass enhancement 5 cm prominent low right axillary lymph nodes   06/23/2012 Surgery   Bilateral mastectomies: 3 right sentinel lymph nodes negative, left axillary lymph nodes negative Right breast: Poorly differentiated IDC 3.8 cm ER 100% PR 90% HER2 negative T2 N0 stage IIa with negative margins (Treatment was done at [facility])   07/2012 - 12/2012 Chemotherapy   Adjuvant chemotherapy with dose dense Adriamycin and Cytoxan followed by Taxol   12/2012 -  Anti-estrogen oral therapy   Adjuvant antiestrogen therapy     CHIEF COMPLIANT: Follow-up for antiestrogen therapy  HISTORY OF PRESENT ILLNESS:   History of Present Illness Brittany Vincent is a 68 year old female who presents for a follow-up visit on antiestrogen therapy.  She was noted in bone density to have osteopenia.  She is on hormone therapy without issues and requests refills. Her bone density scan last year showed a T-score of minus two, consistent with osteopenia. 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 Her recent mammogram was normal.     ALLERGIES:  is allergic to no known allergies.  MEDICATIONS:  Current Outpatient Medications  Medication Sig Dispense Refill   blood glucose meter  kit and supplies Dispense based on patient and insurance preference. Use up to four times daily as directed. (FOR ICD-10 E10.9, E11.9). 1 each 0   diltiazem  (CARTIA  XT) 180 MG 24 hr capsule Take 1 capsule (180 mg total) by mouth daily. 90 capsule 2   doxylamine, Sleep, (UNISOM) 25 MG tablet Take 25 mg by mouth at bedtime as needed for sleep. 1 nightly     escitalopram  (LEXAPRO ) 20 MG tablet Take 1 tablet (20 mg total) by mouth daily. 90 tablet 1   ezetimibe  (ZETIA ) 10 MG tablet Take 1 tablet (10 mg total) by mouth daily. 90 tablet 1   fenofibrate  (TRICOR ) 48 MG tablet Take 1 tablet (48 mg total) by mouth daily. 90 tablet 2   letrozole  (FEMARA ) 2.5 MG tablet Take 1 tablet (2.5 mg total) by mouth daily. 90 tablet 3   levothyroxine  (SYNTHROID ) 137 MCG tablet Take 1 tablet (137 mcg total) by mouth daily before breakfast. 90 tablet 1   losartan  (COZAAR ) 25 MG tablet Take 1 tablet (25 mg total) by mouth daily. 90 tablet 3   metFORMIN  (GLUCOPHAGE ) 500 MG tablet Take 1 tablet (500 mg total) by mouth 2 (two) times daily. 180 tablet 1   rosuvastatin  (CRESTOR ) 40 MG tablet Take 1 tablet (40 mg total) by mouth daily. 90 tablet 1   No current facility-administered medications for this visit.    PHYSICAL EXAMINATION: ECOG PERFORMANCE STATUS: 1 - Symptomatic but completely ambulatory  Vitals:   11/25/23 1430  BP: (!) 140/70  Pulse: 64  Resp: 18  Temp: 98.1 F (36.7 C)  SpO2: 98%   Filed Weights   11/25/23 1430  Weight: 202 lb 4.8 oz (91.8 kg)      LABORATORY DATA:  I have reviewed the data as listed    Latest Ref Rng & Units 10/31/2023    2:32 PM 09/24/2023    4:25 PM 01/22/2023    4:12 PM  CMP  Glucose 70 - 99 mg/dL 272  96  73   BUN 6 - 23 mg/dL 16  15  15    Creatinine 0.40 - 1.20 mg/dL 5.36  6.44  0.34   Sodium 135 - 145 mEq/L 139  138  139   Potassium 3.5 - 5.1 mEq/L 4.0  4.4  4.2   Chloride 96 - 112 mEq/L 103  103  103   CO2 19 - 32 mEq/L 27  30  30    Calcium  8.4 - 10.5 mg/dL 9.7   9.6  74.2   Total Protein 6.0 - 8.3 g/dL 7.6  7.3  8.0   Total Bilirubin 0.2 - 1.2 mg/dL 0.4  0.4  0.4   Alkaline Phos 39 - 117 U/L 40  45  52   AST 0 - 37 U/L 29  29  26    ALT 0 - 35 U/L 28  24  20      Lab Results  Component Value Date   WBC 7.4 10/24/2020   HGB 15.0 10/24/2020   HCT 45.6 10/24/2020   MCV 99.8 10/24/2020   PLT 289 10/24/2020   NEUTROABS 3.9 10/24/2020    ASSESSMENT & PLAN:  Malignant neoplasm of central portion of right breast in female, estrogen receptor positive ([name]) 04/24/2022: Right breast suspicious abnormality 1.2 cm biopsy: IDC with DCIS, subareolar lesion: DCIS ER 100%, PR 90%, HER2 negative, poorly differentiated 06/23/2022: Bilateral mastectomies: Right breast: Poorly differentiated IDC 3.8 cm ER 100%, PR 90%, HER2 negative, margins negative, T2 N0 M0 stage IIa 0/3 lymph nodes, left breast: Benign February 2014-July 2014: AC Taxol July 2014: Started antiestrogen therapy (The treatment was given at [facility] hematology oncology) Dr. Fronie Jewett   Letrozole  toxicities: None Patient has been on oral antiestrogen therapy for 10 years.  I discussed with her about the pros and cons of extended endocrine therapy.  There is no clinical data to support continuation of antiestrogen therapy but patient wishes to stay on it and she understands the risks and benefits and we decided to continue on with the letrozole  indefinitely.  We discussed the risk of coronary artery disease as well as osteoporosis.   Breast cancer surveillance: Breast exam 09/23/2022: Benign Mammogram: Patient was getting annual mammograms and ultrasounds at [facility].  I discussed with her that since she had bilateral mastectomies there is no role for mammograms or ultrasounds.  Ultrasounds can be done if there is any palpable abnormality. There is no role of tumor markers a routine blood test. Recommend: Guardant testing for MRD   Return to clinic in 1 year for  follow-up ------------------------------------- Assessment and Plan Assessment & Plan Malignant neoplasm of central portion of right breast, estrogen receptor positive She continued letrozole  without issues, no side effects reported. Recent mammogram normal. - Continue letrozole  2.5 mg orally daily. - Refill prescription at [facility]. - Obtain mammogram results from [place] facility.  Osteopenia Bone density scan showed osteopenia with T-score of -2. Risk of osteoporosis due to estrogen deficiency from hormone therapy. Discussed calcium , vitamin D , and exercise importance. Considered bone-strengthening medication versus watchful waiting. - Encourage regular walking for at least 30 minutes  daily. - Advise intake of calcium  and vitamin D  supplements.      No orders of the defined types were placed in this encounter.  The patient has a good understanding of the overall plan. she agrees with it. she will call with any problems that may develop before the next visit here. Total time spent: 30 mins including face to face time and time spent for planning, charting and co-ordination of care   Viinay K Jadi Deyarmin, MD 11/25/23

## 2023-11-25 NOTE — Assessment & Plan Note (Signed)
 04/24/2022: Right breast suspicious abnormality 1.2 cm biopsy: IDC with DCIS, subareolar lesion: DCIS ER 100%, PR 90%, HER2 negative, poorly differentiated 06/23/2022: Bilateral mastectomies: Right breast: Poorly differentiated IDC 3.8 cm ER 100%, PR 90%, HER2 negative, margins negative, T2 N0 M0 stage IIa 0/3 lymph nodes, left breast: Benign February 2014-July 2014: [facility] Taxol July 2014: Started antiestrogen therapy (The treatment was given at [facility] hematology oncology) Dr. Fronie Jewett   Letrozole  toxicities: None Patient has been on oral antiestrogen therapy for 10 years.  I discussed with her about the pros and cons of extended endocrine therapy.  There is no clinical data to support continuation of antiestrogen therapy but patient wishes to stay on it and she understands the risks and benefits and we decided to continue on with the letrozole  indefinitely.  We discussed the risk of coronary artery disease as well as osteoporosis.   Breast cancer surveillance: Breast exam 09/23/2022: Benign Mammogram: Patient was getting annual mammograms and ultrasounds at [facility].  I discussed with her that since she had bilateral mastectomies there is no role for mammograms or ultrasounds.  Ultrasounds can be done if there is any palpable abnormality. There is no role of tumor markers a routine blood test. Recommend: Guardant testing for MRD   Return to clinic in 1 year for follow-up

## 2023-12-04 ENCOUNTER — Other Ambulatory Visit (HOSPITAL_COMMUNITY): Payer: Self-pay

## 2024-01-31 ENCOUNTER — Other Ambulatory Visit: Payer: Self-pay | Admitting: Family Medicine

## 2024-01-31 ENCOUNTER — Other Ambulatory Visit (HOSPITAL_COMMUNITY): Payer: Self-pay

## 2024-01-31 DIAGNOSIS — F418 Other specified anxiety disorders: Secondary | ICD-10-CM

## 2024-02-02 ENCOUNTER — Other Ambulatory Visit (HOSPITAL_COMMUNITY): Payer: Self-pay

## 2024-02-02 ENCOUNTER — Other Ambulatory Visit: Payer: Self-pay

## 2024-02-02 MED ORDER — ESCITALOPRAM OXALATE 20 MG PO TABS
20.0000 mg | ORAL_TABLET | Freq: Every day | ORAL | 1 refills | Status: AC
  Filled 2024-02-02: qty 90, 90d supply, fill #0
  Filled 2024-05-12: qty 90, 90d supply, fill #1

## 2024-03-23 ENCOUNTER — Other Ambulatory Visit: Payer: Self-pay | Admitting: Family Medicine

## 2024-03-23 DIAGNOSIS — E039 Hypothyroidism, unspecified: Secondary | ICD-10-CM

## 2024-03-24 ENCOUNTER — Other Ambulatory Visit (HOSPITAL_COMMUNITY): Payer: Self-pay

## 2024-03-24 ENCOUNTER — Other Ambulatory Visit: Payer: Self-pay

## 2024-03-24 MED ORDER — LEVOTHYROXINE SODIUM 137 MCG PO TABS
137.0000 ug | ORAL_TABLET | Freq: Every day | ORAL | 1 refills | Status: AC
  Filled 2024-03-24: qty 90, 90d supply, fill #0
  Filled 2024-06-28: qty 90, 90d supply, fill #1

## 2024-03-25 ENCOUNTER — Ambulatory Visit (INDEPENDENT_AMBULATORY_CARE_PROVIDER_SITE_OTHER): Admitting: Family Medicine

## 2024-03-25 ENCOUNTER — Other Ambulatory Visit (HOSPITAL_COMMUNITY): Payer: Self-pay

## 2024-03-25 ENCOUNTER — Encounter: Payer: Self-pay | Admitting: Family Medicine

## 2024-03-25 VITALS — BP 116/68 | HR 67 | Temp 98.5°F | Ht 66.0 in | Wt 198.2 lb

## 2024-03-25 DIAGNOSIS — Z7984 Long term (current) use of oral hypoglycemic drugs: Secondary | ICD-10-CM

## 2024-03-25 DIAGNOSIS — Z0000 Encounter for general adult medical examination without abnormal findings: Secondary | ICD-10-CM | POA: Diagnosis not present

## 2024-03-25 DIAGNOSIS — E039 Hypothyroidism, unspecified: Secondary | ICD-10-CM | POA: Diagnosis not present

## 2024-03-25 DIAGNOSIS — Z23 Encounter for immunization: Secondary | ICD-10-CM | POA: Diagnosis not present

## 2024-03-25 DIAGNOSIS — E785 Hyperlipidemia, unspecified: Secondary | ICD-10-CM | POA: Diagnosis not present

## 2024-03-25 DIAGNOSIS — E119 Type 2 diabetes mellitus without complications: Secondary | ICD-10-CM | POA: Diagnosis not present

## 2024-03-25 MED ORDER — ROSUVASTATIN CALCIUM 40 MG PO TABS
40.0000 mg | ORAL_TABLET | Freq: Every day | ORAL | 1 refills | Status: AC
  Filled 2024-03-25 – 2024-05-12 (×2): qty 90, 90d supply, fill #0

## 2024-03-25 MED ORDER — METFORMIN HCL 500 MG PO TABS
500.0000 mg | ORAL_TABLET | Freq: Two times a day (BID) | ORAL | 1 refills | Status: AC
  Filled 2024-03-25 – 2024-05-12 (×2): qty 180, 90d supply, fill #0

## 2024-03-25 NOTE — Patient Instructions (Signed)
 Thank you for coming in today. No change in medications at this time. If there are any concerns on your bloodwork, I will let you know. Take care!  Preventive Care 69 Years and Older, Female Preventive care refers to lifestyle choices and visits with your health care provider that can promote health and wellness. Preventive care visits are also called wellness exams. What can I expect for my preventive care visit? Counseling Your health care provider may ask you questions about your: Medical history, including: Past medical problems. Family medical history. Pregnancy and menstrual history. History of falls. Current health, including: Memory and ability to understand (cognition). Emotional well-being. Home life and relationship well-being. Sexual activity and sexual health. Lifestyle, including: Alcohol, nicotine or tobacco, and drug use. Access to firearms. Diet, exercise, and sleep habits. Work and work Astronomer. Sunscreen use. Safety issues such as seatbelt and bike helmet use. Physical exam Your health care provider will check your: Height and weight. These may be used to calculate your BMI (body mass index). BMI is a measurement that tells if you are at a healthy weight. Waist circumference. This measures the distance around your waistline. This measurement also tells if you are at a healthy weight and may help predict your risk of certain diseases, such as type 2 diabetes and high blood pressure. Heart rate and blood pressure. Body temperature. Skin for abnormal spots. What immunizations do I need?  Vaccines are usually given at various ages, according to a schedule. Your health care provider will recommend vaccines for you based on your age, medical history, and lifestyle or other factors, such as travel or where you work. What tests do I need? Screening Your health care provider may recommend screening tests for certain conditions. This may include: Lipid and  cholesterol levels. Hepatitis C test. Hepatitis B test. HIV (human immunodeficiency virus) test. STI (sexually transmitted infection) testing, if you are at risk. Lung cancer screening. Colorectal cancer screening. Diabetes screening. This is done by checking your blood sugar (glucose) after you have not eaten for a while (fasting). Mammogram. Talk with your health care provider about how often you should have regular mammograms. BRCA-related cancer screening. This may be done if you have a family history of breast, ovarian, tubal, or peritoneal cancers. Bone density scan. This is done to screen for osteoporosis. Talk with your health care provider about your test results, treatment options, and if necessary, the need for more tests. Follow these instructions at home: Eating and drinking  Eat a diet that includes fresh fruits and vegetables, whole grains, lean protein, and low-fat dairy products. Limit your intake of foods with high amounts of sugar, saturated fats, and salt. Take vitamin and mineral supplements as recommended by your health care provider. Do not drink alcohol if your health care provider tells you not to drink. If you drink alcohol: Limit how much you have to 0-1 drink a day. Know how much alcohol is in your drink. In the [place] one drink equals one 12 oz bottle of beer (355 mL), one 5 oz glass of wine (148 mL), or one 1 oz glass of hard liquor (44 mL). Lifestyle Brush your teeth every morning and night with fluoride toothpaste. Floss one time each day. Exercise for at least 30 minutes 5 or more days each week. Do not use any products that contain nicotine or tobacco. These products include cigarettes, chewing tobacco, and vaping devices, such as e-cigarettes. If you need help quitting, ask your health care provider. Do not  use drugs. If you are sexually active, practice safe sex. Use a condom or other form of protection in order to prevent STIs. Take aspirin  only as told  by your health care provider. Make sure that you understand how much to take and what form to take. Work with your health care provider to find out whether it is safe and beneficial for you to take aspirin  daily. Ask your health care provider if you need to take a cholesterol-lowering medicine (statin). Find healthy ways to manage stress, such as: Meditation, yoga, or listening to music. Journaling. Talking to a trusted person. Spending time with friends and family. Minimize exposure to UV radiation to reduce your risk of skin cancer. Safety Always wear your seat belt while driving or riding in a vehicle. Do not drive: If you have been drinking alcohol. Do not ride with someone who has been drinking. When you are tired or distracted. While texting. If you have been using any mind-altering substances or drugs. Wear a helmet and other protective equipment during sports activities. If you have firearms in your house, make sure you follow all gun safety procedures. What's next? Visit your health care provider once a year for an annual wellness visit. Ask your health care provider how often you should have your eyes and teeth checked. Stay up to date on all vaccines. This information is not intended to replace advice given to you by your health care provider. Make sure you discuss any questions you have with your health care provider. Document Revised: 12/06/2020 Document Reviewed: 12/06/2020 Elsevier Patient Education  2024 [employer].

## 2024-03-25 NOTE — Progress Notes (Signed)
 Subjective:  Patient ID: Brittany Vincent, female    DOB: 1955/10/30  Age: 68 y.o. MRN: [id number]  CC:  Chief Complaint  Patient presents with   Medical Management of Chronic Issues    Annual Exam    HPI Brittany Vincent presents for Annual Exam. No health changes.  Covid infection in April. Recovered.  New puppy at home.   PCP, [facility] Cardiology, Dr. Shlomo, history of coronary artery calcification.,  Paroxysmal atrial tachycardia, hypertension, OSA, hyperlipidemia. Appt later this month.  Oncology, Dr. Odean, history of breast cancer.  Appt in June, prior eval in [place] with mammogram, ultrasound in April - Dr. Randy.  Prior bilateral mastectomy. Ultrasound is an option if any palpable abnormality.  Continued on letrozole  2.5 mg daily. Optho [facility] Gaudy - last November.   Diabetes: With hyperlipidemia.  Metformin  500 mg twice daily, on ARB and statin. Microalbumin:  nl ratio in May.  Optho, foot exam, pneumovax:  Up to date.  Home readings 121-127, highest 167.   Lab Results  Component Value Date   HGBA1C 7.0 (H) 10/31/2023   HGBA1C 6.9 (H) 09/24/2023   HGBA1C 6.6 (H) 01/22/2023   Lab Results  Component Value Date   MICROALBUR <0.7 10/31/2023   LDLCALC 26 10/31/2023   CREATININE 0.80 10/31/2023   Hypertension: Diltiazem  180 mg daily, losartan  25 mg daily. Home readings: none.  BP Readings from Last 3 Encounters:  03/25/24 116/68  11/25/23 (!) 140/70  09/24/23 118/60   Lab Results  Component Value Date   CREATININE 0.80 10/31/2023    Hypothyroidism: Lab Results  Component Value Date   TSH 1.53 10/31/2023  Synthroid  137 mcg daily. Taking medication daily.  No new hot or cold intolerance. No new hair or skin changes, heart palpitations or new fatigue. No new weight changes.    Hyperlipidemia: Followed by cardiology as above, treated with Crestor  40 mg daily, fenofibrate  48 mg and Zetia  10 mg daily, prior elevated coronary calcium  scoring.  Denies myalgias with  combo of these meds.  Lab Results  Component Value Date   CHOL 115 10/31/2023   HDL 43.50 10/31/2023   LDLCALC 26 10/31/2023   LDLDIRECT 100.0 11/15/2020   TRIG 228.0 (H) 10/31/2023   CHOLHDL 3 10/31/2023   Lab Results  Component Value Date   ALT 28 10/31/2023   AST 29 10/31/2023   ALKPHOS 40 10/31/2023   BILITOT 0.4 10/31/2023    Situational anxiety Lexapro  20 mg daily, still working well. Similar stressors.      09/24/2023    4:02 PM 07/15/2022    1:46 PM 05/09/2022    1:45 PM 01/09/2022    2:37 PM 07/05/2021    2:40 PM  Depression screen PHQ 2/9  Decreased Interest 0 0 0 0 0  Down, Depressed, Hopeless 0 0 0 0 0  PHQ - 2 Score 0 0 0 0 0  Altered sleeping 0 0 0  1  Tired, decreased energy 0 0 0  0  Change in appetite 0 0 0  0  Feeling bad or failure about yourself  0 0 0  0  Trouble concentrating 0  0  0  Moving slowly or fidgety/restless 0 0 0  0  Suicidal thoughts 0 0 0  0  PHQ-9 Score 0 0 0  1  Difficult doing work/chores   Not difficult at all      Health Maintenance  Topic Date Due   Medicare Annual Wellness (AWV)  05/10/2023   Influenza Vaccine  01/23/2024   COVID-19 Vaccine (6 - 2025-26 season) 02/23/2024   HEMOGLOBIN A1C  05/02/2024   OPHTHALMOLOGY EXAM  05/04/2024   FOOT EXAM  09/23/2024   Diabetic kidney evaluation - eGFR measurement  10/30/2024   Diabetic kidney evaluation - Urine ACR  10/30/2024   DTaP/Tdap/Td (2 - Td or Tdap) 06/24/2025   Colonoscopy  01/23/2027   Pneumococcal Vaccine: 50+ Years  Completed   DEXA SCAN  Completed   Hepatitis C Screening  Completed   HPV VACCINES  Aged Out   Meningococcal B Vaccine  Aged Out   Mammogram  Discontinued   Zoster Vaccines- Shingrix  Discontinued  Colonoscopy in 2018 - repeat 72yrs, d/w GI last year.  Recent mammogram/US .  Pap testing - over 3 years. Always normal., may follow up with GYN to discuss. Plans to schedule.  BMD in 02/2023 - osteopenia.  Last vitamin D  Lab Results  Component Value Date    VD25OH 44.78 07/15/2022     Immunization History  Administered Date(s) Administered   Fluad  Quad(high Dose 65+) 05/02/2022   Fluad  Trivalent(High Dose 65+) 06/02/2023   Influenza, Seasonal, Injecte, Preservative Fre 02/25/2013   Influenza,inj,Quad PF,6+ Mos 04/09/2017, 03/25/2019, 04/12/2020   Influenza,inj,quad, With Preservative 03/18/2016, 04/09/2017   Influenza-Unspecified 06/05/2021   PFIZER(Purple Top)SARS-COV-2 Vaccination 08/04/2019, 08/25/2019, 04/03/2020, 10/01/2020, 05/02/2022   PNEUMOCOCCAL CONJUGATE-20 07/05/2021   Pfizer Covid-19 Vaccine Bivalent Booster 1yrs & up 05/22/2022   Tdap 06/25/2015  Flu vaccine today  Covid booster - recommended at pharmacy.  RSV - option at pharmacy.   No results found. Optho as above.   Dental:every 16mo  Alcohol:none  Tobacco: none  Exercise: less recently.  Wt Readings from Last 3 Encounters:  03/25/24 198 lb 3.2 oz (89.9 kg)  11/25/23 202 lb 4.8 oz (91.8 kg)  09/24/23 200 lb 9.6 oz (91 kg)      History Patient Active Problem List   Diagnosis Date Noted   Paroxysmal atrial tachycardia 04/14/2023   Benign essential HTN 04/14/2023   Coronary artery calcification seen on CT scan 04/07/2022   Type 2 diabetes mellitus with hyperglycemia, without long-term current use of insulin ([name]) 02/17/2021   OSA (obstructive sleep apnea) 02/15/2021   Class 1 obesity with serious comorbidity and body mass index (BMI) of 31.0 to 31.9 in adult 02/15/2021   Genetic testing 12/22/2020   Family history of malignant neoplasm of ovary 12/13/2020   Family history of malignant neoplasm of digestive organ 12/13/2020   Family history of melanoma 12/13/2020   Family history of uterine cancer 12/13/2020   Personal history of malignant neoplasm of breast 05/01/2016   Hypothyroidism 05/01/2016   Hyperlipidemia 05/01/2016   Malignant neoplasm of central portion of right breast in female, estrogen receptor positive ([name]) 2013   Past Medical  History:  Diagnosis Date   Agatston coronary artery calcium  score less than 100    44.7 on scan 03/2022   Benign essential HTN 04/14/2023   Breast cancer ([name]) 2013   Coronary artery calcification seen on CT scan 04/07/2022   44.7 on scan 03/2022  -ETT 12/14/20: no ischemia  -CAC score 04/05/22: 44.7 (70th percentile); aortic atherosclerosis     Depression    Family history of malignant neoplasm of digestive organ    Family history of malignant neoplasm of ovary    Family history of melanoma    Family history of uterine cancer    Fatty liver    Hyperlipidemia    Hypertension    Hypothyroidism 05/01/2016  Infertility, female    Lactose intolerance    Obese    OSA (obstructive sleep apnea) 02/15/2021   Paroxysmal atrial tachycardia 04/14/2023   -TTE 12/06/20: EF 55-60, no RWMA, Gr 1 DD, NL RVSF, trivial MR, RAP 3  -Monitor 11/2020: nonsustained ATach, PVCs -Diltiazem  Rx     Personal history of malignant neoplasm of breast    Pre-diabetes    Shortness of breath on exertion    Sleep apnea    Thyroid  disease    Type 2 diabetes mellitus with hyperglycemia, without long-term current use of insulin ([name]) 02/17/2021   Past Surgical History:  Procedure Laterality Date   BREAST SURGERY     double mastectomy and chemotherapy   BUNIONECTOMY Right    HERNIA REPAIR     REFRACTIVE SURGERY     Allergies  Allergen Reactions   No Known Allergies    Prior to Admission medications   Medication Sig Start Date End Date Taking? Authorizing Provider  blood glucose meter kit and supplies Dispense based on patient and insurance preference. Use up to four times daily as directed. (FOR ICD-10 E10.9, E11.9). 11/15/19  Yes Levora Reyes SAUNDERS, MD  diltiazem  (CARTIA  XT) 180 MG 24 hr capsule Take 1 capsule (180 mg total) by mouth daily. 06/09/23  Yes Turner, Wilbert SAUNDERS, MD  doxylamine, Sleep, (UNISOM) 25 MG tablet Take 25 mg by mouth at bedtime as needed for sleep. 1 nightly   Yes [provider]   escitalopram  (LEXAPRO ) 20 MG tablet Take 1 tablet (20 mg total) by mouth daily. 02/02/24  Yes Levora Reyes SAUNDERS, MD  ezetimibe  (ZETIA ) 10 MG tablet Take 1 tablet (10 mg total) by mouth daily. 10/28/23 05/02/24 Yes Turner, Wilbert SAUNDERS, MD  fenofibrate  (TRICOR ) 48 MG tablet Take 1 tablet (48 mg total) by mouth daily. 07/15/23  Yes Turner, Wilbert SAUNDERS, MD  letrozole  (FEMARA ) 2.5 MG tablet Take 1 tablet (2.5 mg total) by mouth daily. 11/25/23  Yes Gudena, Vinay, MD  levothyroxine  (SYNTHROID ) 137 MCG tablet Take 1 tablet (137 mcg total) by mouth daily before breakfast. 03/24/24  Yes Levora Reyes SAUNDERS, MD  losartan  (COZAAR ) 25 MG tablet Take 1 tablet (25 mg total) by mouth daily. 05/07/23  Yes Turner, Wilbert SAUNDERS, MD  metFORMIN  (GLUCOPHAGE ) 500 MG tablet Take 1 tablet (500 mg total) by mouth 2 (two) times daily. 09/24/23  Yes Levora Reyes SAUNDERS, MD  rosuvastatin  (CRESTOR ) 40 MG tablet Take 1 tablet (40 mg total) by mouth daily. 09/24/23  Yes Levora Reyes SAUNDERS, MD   Social History   Socioeconomic History   Marital status: Married    Spouse name: Not on file   Number of children: 3   Years of education: Not on file   Highest education level: Not on file  Occupational History   Occupation: RN   Occupation: Telephonic nursing  Tobacco Use   Smoking status: Never   Smokeless tobacco: Never  Vaping Use   Vaping status: Never Used  Substance and Sexual Activity   Alcohol use: No   Drug use: No   Sexual activity: Not on file  Other Topics Concern   Not on file  Social History Narrative   Lives with her husband.   Older son lives in [place], [place] with his wife and their 2 children.   Younger Theatre stage manager) son lives in [place], with his wife (Dayna) and their daughter, Ava.   Ryan and Dayna are both physician assistants with [employer].   Daughter recently moved to  [place] .   Moved to [place] from [place], [place] to be nearer to [place], Dayna and Ava.   Social Drivers of Corporate investment banker Strain: Low Risk   (05/09/2022)   Overall Financial Resource Strain (CARDIA)    Difficulty of Paying Living Expenses: Not hard at all  Food Insecurity: No Food Insecurity (05/09/2022)   Hunger Vital Sign    Worried About Running Out of Food in the Last Year: Never true    Ran Out of Food in the Last Year: Never true  Transportation Needs: No Transportation Needs (05/09/2022)   PRAPARE - Administrator, Civil Service (Medical): No    Lack of Transportation (Non-Medical): No  Physical Activity: Inactive (05/09/2022)   Exercise Vital Sign    Days of Exercise per Week: 0 days    Minutes of Exercise per Session: 0 min  Stress: No Stress Concern Present (05/09/2022)   [employer] of Occupational Health - Occupational Stress Questionnaire    Feeling of Stress : Not at all  Social Connections: Moderately Isolated (05/09/2022)   Social Connection and Isolation Panel    Frequency of Communication with Friends and Family: More than three times a week    Frequency of Social Gatherings with Friends and Family: More than three times a week    Attends Religious Services: Never    Database administrator or Organizations: No    Attends Banker Meetings: Never    Marital Status: Married  Catering manager Violence: Not At Risk (05/09/2022)   Humiliation, Afraid, Rape, and Kick questionnaire    Fear of Current or Ex-Partner: No    Emotionally Abused: No    Physically Abused: No    Sexually Abused: No    Review of Systems  Constitutional:  Negative for fatigue and unexpected weight change.  Respiratory:  Negative for chest tightness and shortness of breath.   Cardiovascular:  Negative for chest pain, palpitations and leg swelling.  Gastrointestinal:  Negative for abdominal pain and blood in stool.  Neurological:  Negative for dizziness, syncope, light-headedness and headaches.  13 point review of systems per patient health survey noted.  Negative other than as indicated above or in HPI.      Objective:   Vitals:   03/25/24 1524  BP: 116/68  Pulse: 67  Temp: 98.5 F (36.9 C)  SpO2: 99%  Weight: 198 lb 3.2 oz (89.9 kg)  Height: 5' 6 (1.676 m)     Physical Exam Vitals reviewed.  Constitutional:      Appearance: She is well-developed.  HENT:     Head: Normocephalic and atraumatic.     Right Ear: External ear normal.     Left Ear: External ear normal.  Eyes:     Conjunctiva/sclera: Conjunctivae normal.     Pupils: Pupils are equal, round, and reactive to light.  Neck:     Thyroid : No thyromegaly.  Cardiovascular:     Rate and Rhythm: Normal rate and regular rhythm.     Heart sounds: Normal heart sounds. No murmur heard. Pulmonary:     Effort: Pulmonary effort is normal. No respiratory distress.     Breath sounds: Normal breath sounds. No wheezing.  Abdominal:     General: Bowel sounds are normal.     Palpations: Abdomen is soft.     Tenderness: There is no abdominal tenderness.  Musculoskeletal:        General: No tenderness. Normal range of motion.     Cervical  back: Normal range of motion and neck supple.  Lymphadenopathy:     Cervical: No cervical adenopathy.  Skin:    General: Skin is warm and dry.     Findings: No rash.  Neurological:     Mental Status: She is alert and oriented to person, place, and time.  Psychiatric:        Behavior: Behavior normal.        Thought Content: Thought content normal.        Assessment & Plan:  Brittany Vincent is a 68 y.o. female . Annual physical exam - -anticipatory guidance as below in AVS, screening labs above. Health maintenance items as above in HPI discussed/recommended as applicable.   Hyperlipidemia, unspecified hyperlipidemia type - Plan: rosuvastatin  (CRESTOR ) 40 MG tablet, Comprehensive metabolic panel with GFR, Lipid panel  -  Stable, tolerating current regimen. Medications refilled. Labs pending as above.   Type 2 diabetes mellitus without complication, without long-term current use of  insulin ([name]) - Plan: metFORMIN  (GLUCOPHAGE ) 500 MG tablet, Hemoglobin A1c  - Check labs and adjust plan accordingly, no change in meds for now.  Immunization due - Plan: Flu vaccine HIGH DOSE PF(Fluzone Trivalent)  Hypothyroidism, unspecified type - Plan: TSH Check TSH and adjust regimen accordingly.  Meds ordered this encounter  Medications   rosuvastatin  (CRESTOR ) 40 MG tablet    Sig: Take 1 tablet (40 mg total) by mouth daily.    Dispense:  90 tablet    Refill:  1   metFORMIN  (GLUCOPHAGE ) 500 MG tablet    Sig: Take 1 tablet (500 mg total) by mouth 2 (two) times daily.    Dispense:  180 tablet    Refill:  1   Patient Instructions  Thank you for coming in today. No change in medications at this time. If there are any concerns on your bloodwork, I will let you know. Take care!  Preventive Care 68 Years and Older, Female Preventive care refers to lifestyle choices and visits with your health care provider that can promote health and wellness. Preventive care visits are also called wellness exams. What can I expect for my preventive care visit? Counseling Your health care provider may ask you questions about your: Medical history, including: Past medical problems. Family medical history. Pregnancy and menstrual history. History of falls. Current health, including: Memory and ability to understand (cognition). Emotional well-being. Home life and relationship well-being. Sexual activity and sexual health. Lifestyle, including: Alcohol, nicotine or tobacco, and drug use. Access to firearms. Diet, exercise, and sleep habits. Work and work Astronomer. Sunscreen use. Safety issues such as seatbelt and bike helmet use. Physical exam Your health care provider will check your: Height and weight. These may be used to calculate your BMI (body mass index). BMI is a measurement that tells if you are at a healthy weight. Waist circumference. This measures the distance around your  waistline. This measurement also tells if you are at a healthy weight and may help predict your risk of certain diseases, such as type 2 diabetes and high blood pressure. Heart rate and blood pressure. Body temperature. Skin for abnormal spots. What immunizations do I need?  Vaccines are usually given at various ages, according to a schedule. Your health care provider will recommend vaccines for you based on your age, medical history, and lifestyle or other factors, such as travel or where you work. What tests do I need? Screening Your health care provider may recommend screening tests for certain conditions. This may include: Lipid  and cholesterol levels. Hepatitis C test. Hepatitis B test. HIV (human immunodeficiency virus) test. STI (sexually transmitted infection) testing, if you are at risk. Lung cancer screening. Colorectal cancer screening. Diabetes screening. This is done by checking your blood sugar (glucose) after you have not eaten for a while (fasting). Mammogram. Talk with your health care provider about how often you should have regular mammograms. BRCA-related cancer screening. This may be done if you have a family history of breast, ovarian, tubal, or peritoneal cancers. Bone density scan. This is done to screen for osteoporosis. Talk with your health care provider about your test results, treatment options, and if necessary, the need for more tests. Follow these instructions at home: Eating and drinking  Eat a diet that includes fresh fruits and vegetables, whole grains, lean protein, and low-fat dairy products. Limit your intake of foods with high amounts of sugar, saturated fats, and salt. Take vitamin and mineral supplements as recommended by your health care provider. Do not drink alcohol if your health care provider tells you not to drink. If you drink alcohol: Limit how much you have to 0-1 drink a day. Know how much alcohol is in your drink. In the [place] one  drink equals one 12 oz bottle of beer (355 mL), one 5 oz glass of wine (148 mL), or one 1 oz glass of hard liquor (44 mL). Lifestyle Brush your teeth every morning and night with fluoride toothpaste. Floss one time each day. Exercise for at least 30 minutes 5 or more days each week. Do not use any products that contain nicotine or tobacco. These products include cigarettes, chewing tobacco, and vaping devices, such as e-cigarettes. If you need help quitting, ask your health care provider. Do not use drugs. If you are sexually active, practice safe sex. Use a condom or other form of protection in order to prevent STIs. Take aspirin only as told by your health care provider. Make sure that you understand how much to take and what form to take. Work with your health care provider to find out whether it is safe and beneficial for you to take aspirin daily. Ask your health care provider if you need to take a cholesterol-lowering medicine (statin). Find healthy ways to manage stress, such as: Meditation, yoga, or listening to music. Journaling. Talking to a trusted person. Spending time with friends and family. Minimize exposure to UV radiation to reduce your risk of skin cancer. Safety Always wear your seat belt while driving or riding in a vehicle. Do not drive: If you have been drinking alcohol. Do not ride with someone who has been drinking. When you are tired or distracted. While texting. If you have been using any mind-altering substances or drugs. Wear a helmet and other protective equipment during sports activities. If you have firearms in your house, make sure you follow all gun safety procedures. What's next? Visit your health care provider once a year for an annual wellness visit. Ask your health care provider how often you should have your eyes and teeth checked. Stay up to date on all vaccines. This information is not intended to replace advice given to you by your health care  provider. Make sure you discuss any questions you have with your health care provider. Document Revised: 12/06/2020 Document Reviewed: 12/06/2020 Elsevier Patient Education  2024 Elsevier Inc.     Signed,   Reyes Pines, MD [facility], [facility] 03/25/24 4:25 PM

## 2024-03-26 LAB — COMPREHENSIVE METABOLIC PANEL WITH GFR
ALT: 21 U/L (ref 0–35)
AST: 33 U/L (ref 0–37)
Albumin: 4.6 g/dL (ref 3.5–5.2)
Alkaline Phosphatase: 43 U/L (ref 39–117)
BUN: 19 mg/dL (ref 6–23)
CO2: 28 meq/L (ref 19–32)
Calcium: 9.9 mg/dL (ref 8.4–10.5)
Chloride: 103 meq/L (ref 96–112)
Creatinine, Ser: 0.91 mg/dL (ref 0.40–1.20)
GFR: 65.06 mL/min (ref >60.00)
Glucose, Bld: 95 mg/dL (ref 70–99)
Potassium: 4.3 meq/L (ref 3.5–5.1)
Sodium: 139 meq/L (ref 135–145)
Total Bilirubin: 0.4 mg/dL (ref 0.2–1.2)
Total Protein: 7.7 g/dL (ref 6.0–8.3)

## 2024-03-26 LAB — LIPID PANEL
Cholesterol: 114 mg/dL (ref 0–200)
HDL: 39.5 mg/dL (ref >39.00)
LDL Cholesterol: 28 mg/dL (ref 0–99)
NonHDL: 74.52
Total CHOL/HDL Ratio: 3
Triglycerides: 235 mg/dL — ABNORMAL HIGH (ref 0.0–149.0)
VLDL: 47 mg/dL — ABNORMAL HIGH (ref 0.0–40.0)

## 2024-03-26 LAB — HEMOGLOBIN A1C: Hgb A1c MFr Bld: 7.1 % — ABNORMAL HIGH (ref 4.6–6.5)

## 2024-03-26 LAB — TSH: TSH: 1.55 u[IU]/mL (ref 0.35–5.50)

## 2024-03-28 ENCOUNTER — Encounter: Payer: Self-pay | Admitting: Family Medicine

## 2024-03-30 ENCOUNTER — Other Ambulatory Visit (HOSPITAL_BASED_OUTPATIENT_CLINIC_OR_DEPARTMENT_OTHER): Payer: Self-pay | Admitting: Cardiology

## 2024-03-30 ENCOUNTER — Ambulatory Visit: Payer: Self-pay | Admitting: Family Medicine

## 2024-04-01 ENCOUNTER — Other Ambulatory Visit (HOSPITAL_COMMUNITY): Payer: Self-pay

## 2024-04-01 ENCOUNTER — Other Ambulatory Visit: Payer: Self-pay

## 2024-04-01 MED ORDER — DILTIAZEM HCL ER COATED BEADS 180 MG PO CP24
180.0000 mg | ORAL_CAPSULE | Freq: Every day | ORAL | 0 refills | Status: DC
  Filled 2024-04-01: qty 90, 90d supply, fill #0

## 2024-04-15 ENCOUNTER — Ambulatory Visit: Admitting: Cardiology

## 2024-04-26 ENCOUNTER — Other Ambulatory Visit: Payer: Self-pay | Admitting: Cardiology

## 2024-04-26 DIAGNOSIS — E782 Mixed hyperlipidemia: Secondary | ICD-10-CM

## 2024-04-27 ENCOUNTER — Other Ambulatory Visit: Payer: Self-pay

## 2024-04-27 MED ORDER — EZETIMIBE 10 MG PO TABS
10.0000 mg | ORAL_TABLET | Freq: Every day | ORAL | 0 refills | Status: DC
  Filled 2024-04-27: qty 90, 90d supply, fill #0

## 2024-04-27 MED ORDER — FENOFIBRATE 48 MG PO TABS
48.0000 mg | ORAL_TABLET | Freq: Every day | ORAL | 0 refills | Status: AC
  Filled 2024-04-27: qty 90, 90d supply, fill #0

## 2024-05-12 ENCOUNTER — Other Ambulatory Visit (HOSPITAL_BASED_OUTPATIENT_CLINIC_OR_DEPARTMENT_OTHER): Payer: Self-pay | Admitting: Cardiology

## 2024-05-12 ENCOUNTER — Other Ambulatory Visit: Payer: Self-pay

## 2024-05-12 ENCOUNTER — Other Ambulatory Visit (HOSPITAL_COMMUNITY): Payer: Self-pay

## 2024-05-12 DIAGNOSIS — I10 Essential (primary) hypertension: Secondary | ICD-10-CM

## 2024-05-12 MED ORDER — LOSARTAN POTASSIUM 25 MG PO TABS
25.0000 mg | ORAL_TABLET | Freq: Every day | ORAL | 0 refills | Status: AC
Start: 2024-05-12 — End: ?
  Filled 2024-05-12: qty 90, 90d supply, fill #0

## 2024-06-28 ENCOUNTER — Other Ambulatory Visit: Payer: Self-pay | Admitting: Cardiology

## 2024-06-28 ENCOUNTER — Other Ambulatory Visit (HOSPITAL_BASED_OUTPATIENT_CLINIC_OR_DEPARTMENT_OTHER): Payer: Self-pay | Admitting: Cardiology

## 2024-06-28 DIAGNOSIS — E782 Mixed hyperlipidemia: Secondary | ICD-10-CM

## 2024-06-29 ENCOUNTER — Encounter: Payer: Self-pay | Admitting: Pharmacist

## 2024-06-29 ENCOUNTER — Other Ambulatory Visit (HOSPITAL_COMMUNITY): Payer: Self-pay

## 2024-06-29 ENCOUNTER — Other Ambulatory Visit: Payer: Self-pay

## 2024-06-29 MED ORDER — DILTIAZEM HCL ER COATED BEADS 180 MG PO CP24
180.0000 mg | ORAL_CAPSULE | Freq: Every day | ORAL | 0 refills | Status: AC
  Filled 2024-06-29: qty 90, 90d supply, fill #0

## 2024-06-30 ENCOUNTER — Other Ambulatory Visit: Payer: Self-pay

## 2024-06-30 ENCOUNTER — Other Ambulatory Visit (HOSPITAL_COMMUNITY): Payer: Self-pay

## 2024-06-30 MED ORDER — EZETIMIBE 10 MG PO TABS
10.0000 mg | ORAL_TABLET | Freq: Every day | ORAL | 0 refills | Status: AC
  Filled 2024-06-30 – 2024-07-05 (×4): qty 90, 90d supply, fill #0

## 2024-07-01 ENCOUNTER — Other Ambulatory Visit: Payer: Self-pay

## 2024-07-01 ENCOUNTER — Other Ambulatory Visit (HOSPITAL_COMMUNITY): Payer: Self-pay

## 2024-07-02 ENCOUNTER — Other Ambulatory Visit (HOSPITAL_COMMUNITY): Payer: Self-pay

## 2024-07-04 ENCOUNTER — Other Ambulatory Visit: Payer: Self-pay

## 2024-07-04 ENCOUNTER — Other Ambulatory Visit (HOSPITAL_COMMUNITY): Payer: Self-pay

## 2024-07-05 ENCOUNTER — Ambulatory Visit: Admitting: Cardiology

## 2024-07-05 ENCOUNTER — Other Ambulatory Visit: Payer: Self-pay

## 2024-07-05 ENCOUNTER — Other Ambulatory Visit (HOSPITAL_COMMUNITY): Payer: Self-pay

## 2024-07-15 ENCOUNTER — Ambulatory Visit: Attending: Cardiology | Admitting: Cardiology

## 2024-07-15 VITALS — BP 112/58 | HR 56 | Ht 67.0 in | Wt 200.0 lb

## 2024-07-15 DIAGNOSIS — I10 Essential (primary) hypertension: Secondary | ICD-10-CM | POA: Diagnosis not present

## 2024-07-15 DIAGNOSIS — R011 Cardiac murmur, unspecified: Secondary | ICD-10-CM

## 2024-07-15 DIAGNOSIS — I2510 Atherosclerotic heart disease of native coronary artery without angina pectoris: Secondary | ICD-10-CM

## 2024-07-15 DIAGNOSIS — I4510 Unspecified right bundle-branch block: Secondary | ICD-10-CM | POA: Diagnosis not present

## 2024-07-15 DIAGNOSIS — G4733 Obstructive sleep apnea (adult) (pediatric): Secondary | ICD-10-CM

## 2024-07-15 DIAGNOSIS — E782 Mixed hyperlipidemia: Secondary | ICD-10-CM | POA: Diagnosis not present

## 2024-07-15 DIAGNOSIS — I4719 Other supraventricular tachycardia: Secondary | ICD-10-CM | POA: Diagnosis not present

## 2024-07-15 NOTE — Patient Instructions (Addendum)
 Medication Instructions:  No changes  *If you need a refill on your cardiac medications before your next appointment, please call your pharmacy*   Lab Work: fasting  LIPID ALT If you have labs (blood work) drawn today and your tests are completely normal, you will receive your results only by: MyChart Message (if you have MyChart) OR A paper copy in the mail If you have any lab test that is abnormal or we need to change your treatment, we will call you to review the results.   Testing/Procedure Your physician has requested that you have an echocardiogram. Echocardiography is a painless test that uses sound waves to create images of your heart. It provides your doctor with information about the size and shape of your heart and how well your hearts chambers and valves are working. This procedure takes approximately one hour. There are no restrictions for this procedure. Please do NOT wear cologne, perfume, aftershave, or lotions (deodorant is allowed). Please arrive 15 minutes prior to your appointment time.  Please note: We ask at that you not bring children with you during ultrasound (echo/ vascular) testing. Due to room size and safety concerns, children are not allowed in the ultrasound rooms during exams. Our front office staff cannot provide observation of children in our lobby area while testing is being conducted. An adult accompanying a patient to their appointment will only be allowed in the ultrasound room at the discretion of the ultrasound technician under special circumstances. We apologize for any inconvenience.     Follow-Up: At [facility], you and your health needs are our priority.  As part of our continuing mission to provide you with exceptional heart care, we have created designated Provider Care Teams.  These Care Teams include your primary Cardiologist (physician) and Advanced Practice Providers (APPs -  Physician Assistants and Nurse Practitioners) who all work  together to provide you with the care you need, when you need it.     Your next appointment:   12 month(s)  The format for your next appointment:   In Person  Provider:   Wilbert Bihari, MD

## 2024-07-15 NOTE — Progress Notes (Addendum)
 "   Cardiology Note    Date:  07/15/2024   ID:  Brittany Vincent, DOB 20-Jul-1955, MRN [id number]  PCP:  Levora Reyes SAUNDERS, MD  Cardiologist:  Wilbert Bihari, MD   Chief Complaint  Patient presents with   Follow-up    Right bundle branch block, coronary artery calcifications, hyperlipidemia, OSA, PAT, hypertension    History of Present Illness:  Brittany Vincent is a 69 y.o. female with a history of hypertension, hyperlipidemia and depression.  She also has a history of breast cancer.  I saw her a year ago for palpitations, excessive daytime sleepiness with loud snoring and abnormal EKG with right bundle branch block.2D echo 12/11/2020 showed normal LV function with EF 55 to 60% with grade 1 diastolic dysfunction.  Exercise treadmill test 11/2020 showed no ischemia.  Event monitor 12/11/2020 showed PVCs and nonsustained atrial tachycardia.  Amlodipine  was stopped and she was started on Cardizem  CD 180 mg daily.  She also underwent sleep study which demonstrated severe obstructive sleep apnea with an AHI of 35.4/h and minimal central sleep apnea with a pAHIc of 6.5/h with nocturnal hypoxemia and O2 saturations less than 88% for 11.6 minutes.  She underwent CPAP titration to 15 cm H2O.    The patient is here today and is doing well.  She denies any CP or pressure, SOB, DOE, PND, orthopnea, LE edema, dizziness, palpitations or syncope.  She is doing well with her PAP device.  She tolerates the full face mask and feels the pressure is adequate.  She feels rested in the am and has no significant daytime sleepiness.  She denies any significant mouth or nasal dryness or nasal congestion.  She does not think that She snores. An Epworth Sleepiness Scale score was calculated the office today and this endorsed at 3 arguing against residual daytime sleepiness. Patient denies any episodes of bruxism, restless legs, No hypnogognic hallucinations or cataplectic events.    Past Medical History:  Diagnosis Date   Agatston  coronary artery calcium  score less than 100    44.7 on scan 03/2022   Benign essential HTN 04/14/2023   Breast cancer ([name]) 2013   Coronary artery calcification seen on CT scan 04/07/2022   44.7 on scan 03/2022  -ETT 12/14/20: no ischemia  -CAC score 04/05/22: 44.7 (70th percentile); aortic atherosclerosis     Depression    Family history of malignant neoplasm of digestive organ    Family history of malignant neoplasm of ovary    Family history of melanoma    Family history of uterine cancer    Fatty liver    Hyperlipidemia    Hypertension    Hypothyroidism 05/01/2016   Infertility, female    Lactose intolerance    Obese    OSA (obstructive sleep apnea) 02/15/2021   Paroxysmal atrial tachycardia 04/14/2023   -TTE 12/06/20: EF 55-60, no RWMA, Gr 1 DD, NL RVSF, trivial MR, RAP 3  -Monitor 11/2020: nonsustained ATach, PVCs -Diltiazem  Rx     Personal history of malignant neoplasm of breast    Pre-diabetes    Shortness of breath on exertion    Sleep apnea    Thyroid  disease    Type 2 diabetes mellitus with hyperglycemia, without long-term current use of insulin ([name]) 02/17/2021    Past Surgical History:  Procedure Laterality Date   BREAST SURGERY     double mastectomy and chemotherapy   BUNIONECTOMY Right    HERNIA REPAIR     REFRACTIVE SURGERY  Current Medications: Current Meds  Medication Sig   blood glucose meter kit and supplies Dispense based on patient and insurance preference. Use up to four times daily as directed. (FOR ICD-10 E10.9, E11.9).   diltiazem  (CARTIA  XT) 180 MG 24 hr capsule Take 1 capsule (180 mg total) by mouth daily.   doxylamine, Sleep, (UNISOM) 25 MG tablet Take 25 mg by mouth at bedtime as needed for sleep. 1 nightly   escitalopram  (LEXAPRO ) 20 MG tablet Take 1 tablet (20 mg total) by mouth daily.   ezetimibe  (ZETIA ) 10 MG tablet Take 1 tablet (10 mg total) by mouth daily.   fenofibrate  (TRICOR ) 48 MG tablet Take 1 tablet (48 mg total) by mouth  daily.   letrozole  (FEMARA ) 2.5 MG tablet Take 1 tablet (2.5 mg total) by mouth daily.   levothyroxine  (SYNTHROID ) 137 MCG tablet Take 1 tablet (137 mcg total) by mouth daily before breakfast.   losartan  (COZAAR ) 25 MG tablet Take 1 tablet (25 mg total) by mouth daily.   metFORMIN  (GLUCOPHAGE ) 500 MG tablet Take 1 tablet (500 mg total) by mouth 2 (two) times daily.   rosuvastatin  (CRESTOR ) 40 MG tablet Take 1 tablet (40 mg total) by mouth daily.    Allergies:   No known allergies   Social History   Socioeconomic History   Marital status: Married    Spouse name: Not on file   Number of children: 3   Years of education: Not on file   Highest education level: Not on file  Occupational History   Occupation: [employer]   Occupation: Telephonic nursing  Tobacco Use   Smoking status: Never   Smokeless tobacco: Never  Vaping Use   Vaping status: Never Used  Substance and Sexual Activity   Alcohol use: No   Drug use: No   Sexual activity: Not on file  Other Topics Concern   Not on file  Social History Narrative   Lives with her husband.   Older son lives in [place], [place] with his wife and their 2 children.   Younger Theatre Stage Manager) son lives in [place], with his wife (Dayna) and their daughter, Ava.   Ryan and Dayna are both physician assistants with [employer].   Daughter recently moved to [place] .   Moved to [place] from [place], [place] to be nearer to [place], Dayna and Ava.   Social Drivers of Health   Tobacco Use: Low Risk (03/28/2024)   Patient History    Smoking Tobacco Use: Never    Smokeless Tobacco Use: Never    Passive Exposure: Not on file  Financial Resource Strain: Low Risk (05/09/2022)   Overall Financial Resource Strain (CARDIA)    Difficulty of Paying Living Expenses: Not hard at all  Food Insecurity: No Food Insecurity (05/09/2022)   Hunger Vital Sign    Worried About Running Out of Food in the Last Year: Never true    Ran Out of Food in the Last Year: Never true   Transportation Needs: No Transportation Needs (05/09/2022)   PRAPARE - Administrator, Civil Service (Medical): No    Lack of Transportation (Non-Medical): No  Physical Activity: Inactive (05/09/2022)   Exercise Vital Sign    Days of Exercise per Week: 0 days    Minutes of Exercise per Session: 0 min  Stress: No Stress Concern Present (05/09/2022)   [employer] of Occupational Health - Occupational Stress Questionnaire    Feeling of Stress : Not at all  Social Connections: Moderately Isolated (05/09/2022)  Social Advertising Account Executive    Frequency of Communication with Friends and Family: More than three times a week    Frequency of Social Gatherings with Friends and Family: More than three times a week    Attends Religious Services: Never    Database Administrator or Organizations: No    Attends Banker Meetings: Never    Marital Status: Married  Depression (PHQ2-9): Low Risk (09/24/2023)   Depression (PHQ2-9)    PHQ-2 Score: 0  Alcohol Screen: Low Risk (05/09/2022)   Alcohol Screen    Last Alcohol Screening Score (AUDIT): 0  Housing: Low Risk (05/09/2022)   Housing    Last Housing Risk Score: 0  Utilities: Not At Risk (05/09/2022)   AHC Utilities    Threatened with loss of utilities: No  Health Literacy: Not on file     Family History:  The patient's family history includes Cancer in her maternal grandfather; Colon cancer in her maternal aunt and maternal grandmother; Heart disease in her mother and paternal grandmother; Hyperlipidemia in her mother; Liver disease in her maternal grandfather; Lung cancer in her father; Stroke in her maternal grandmother and paternal grandfather; Uterine cancer in her mother.   ROS:   Please see the history of present illness.    ROS All other systems reviewed and are negative.      No data to display          PHYSICAL EXAM:   VS:  BP (!) 112/58 (BP Location: Left Arm, Patient Position: Sitting,  Cuff Size: Large)   Pulse (!) 56   Ht 5' 7 (1.702 m)   Wt 200 lb (90.7 kg)   SpO2 97%   BMI 31.32 kg/m    GEN: Well nourished, well developed in no acute distress HEENT: Normal NECK: No JVD; No carotid bruits LYMPHATICS: No lymphadenopathy CARDIAC:RRR, no  rubs, gallops 1/6 SM at RUSB RESPIRATORY:  Clear to auscultation without rales, wheezing or rhonchi  ABDOMEN: Soft, non-tender, non-distended MUSCULOSKELETAL:  No edema; No deformity  SKIN: Warm and dry NEUROLOGIC:  Alert and oriented x 3 PSYCHIATRIC:  Normal affect  Wt Readings from Last 3 Encounters:  07/15/24 200 lb (90.7 kg)  03/25/24 198 lb 3.2 oz (89.9 kg)  11/25/23 202 lb 4.8 oz (91.8 kg)      Studies/Labs Reviewed:   Recent Labs: 03/25/2024: ALT 21; BUN 19; Creatinine, Ser 0.91; Potassium 4.3; Sodium 139; TSH 1.55   Lipid Panel    Component Value Date/Time   CHOL 114 03/25/2024 1628   CHOL 111 12/18/2022 1147   TRIG 235.0 (H) 03/25/2024 1628   HDL 39.50 03/25/2024 1628   HDL 46 12/18/2022 1147   CHOLHDL 3 03/25/2024 1628   VLDL 47.0 (H) 03/25/2024 1628   LDLCALC 28 03/25/2024 1628   LDLCALC 45 12/18/2022 1147   LDLDIRECT 100.0 11/15/2020 1440   Additional studies/ records that were reviewed today include:  Nuclear stress test, 2D echo, sleep study, CPAP titration, PAP compliance download  ASSESSMENT:    1. Right bundle branch block   2. Coronary artery calcification seen on CT scan   3. Mixed hyperlipidemia   4. OSA (obstructive sleep apnea)   5. Paroxysmal atrial tachycardia   6. Benign essential HTN   7. Heart murmur       PLAN:  In order of problems listed above:  Right bundle ranch block Coronary artery calcifications Hyperlipidemia -Exercise treadmill test in 2022 showed no ischemia -Coronary calcium  score 04/12/2022 was  44.7 which was 70 percentile for age, race and sex matched controls -I have personally reviewed and interpreted outside labs performed by patient's PCP which showed  LDL 28, HDL 39, triglycerides 235 and ALT 33 on 03/25/2024 which was not fasting -She denies any anginal symptoms -Continue Zetia  10 mg daily, fenofibrate  48 mg daily, Crestor  40 mg daily with PRN refills -repeat fasting lipid panel ( no fasting for prior labs)  OSA - The patient is tolerating PAP therapy well without any problems. The PAP download performed by his DME was personally reviewed and interpreted by me today and showed an AHI of 0.8 /hr on auto CPAP from 4-20 cm H2O with 90% compliance in using more than 4 hours nightly.  The patient has been using and benefiting from PAP use and will continue to benefit from therapy.   Paroxysmal atrial tachycardia - Denies any palpitations - Continue Cardizem  CD 180 mg daily with as needed refills  Hypertension -BP controlled on exam today -Continue losartan  25 mg daily and Cardizem  CD 180 mg daily with as needed refills -I have personally reviewed and interpreted outside labs performed by patient's PCP which showed SCr 0.91 and potassium 4.3 on 03/25/2024  Heart Murmur -this is new and no AV abnormalities on prior echo -will repeat echo    Medication Adjustments/Labs and Tests Ordered: Current medicines are reviewed at length with the patient today.  Concerns regarding medicines are outlined above.  Medication changes, Labs and Tests ordered today are listed in the Patient Instructions below.  There are no Patient Instructions on file for this visit.   Signed, Wilbert Bihari, MD  07/15/2024 2:55 PM    [facility] [address] [place], [place], [place]  [address] Phone: [phone]; Fax: [phone]   "

## 2024-07-15 NOTE — Addendum Note (Signed)
 Addended by: GLADIS REENA GAILS on: 07/15/2024 03:04 PM   Modules accepted: Orders

## 2024-08-17 ENCOUNTER — Ambulatory Visit (HOSPITAL_COMMUNITY)

## 2024-09-23 ENCOUNTER — Ambulatory Visit: Admitting: Family Medicine

## 2024-11-25 ENCOUNTER — Ambulatory Visit: Admitting: Hematology and Oncology
